# Patient Record
Sex: Female | Born: 1990
Health system: Southern US, Community
[De-identification: ages and names within clinical notes are randomized; demographics above are authoritative.]

## PROBLEM LIST (undated history)

## (undated) ENCOUNTER — Inpatient Hospital Stay (HOSPITAL_COMMUNITY): Payer: Self-pay

## (undated) DIAGNOSIS — F329 Major depressive disorder, single episode, unspecified: Secondary | ICD-10-CM

## (undated) DIAGNOSIS — R51 Headache: Secondary | ICD-10-CM

## (undated) DIAGNOSIS — D649 Anemia, unspecified: Secondary | ICD-10-CM

## (undated) DIAGNOSIS — F53 Postpartum depression: Secondary | ICD-10-CM

## (undated) DIAGNOSIS — F32A Depression, unspecified: Secondary | ICD-10-CM

## (undated) DIAGNOSIS — T7840XA Allergy, unspecified, initial encounter: Secondary | ICD-10-CM

## (undated) DIAGNOSIS — Z9889 Other specified postprocedural states: Secondary | ICD-10-CM

## (undated) DIAGNOSIS — O210 Mild hyperemesis gravidarum: Secondary | ICD-10-CM

## (undated) DIAGNOSIS — R112 Nausea with vomiting, unspecified: Secondary | ICD-10-CM

## (undated) DIAGNOSIS — O99345 Other mental disorders complicating the puerperium: Secondary | ICD-10-CM

## (undated) DIAGNOSIS — O139 Gestational [pregnancy-induced] hypertension without significant proteinuria, unspecified trimester: Secondary | ICD-10-CM

## (undated) HISTORY — DX: Allergy, unspecified, initial encounter: T78.40XA

## (undated) HISTORY — PX: OVARIAN CYST REMOVAL: SHX89

## (undated) HISTORY — DX: Anemia, unspecified: D64.9

## (undated) HISTORY — PX: TONSILLECTOMY: SUR1361

## (undated) HISTORY — DX: Depression, unspecified: F32.A

## (undated) HISTORY — DX: Other mental disorders complicating the puerperium: O99.345

## (undated) HISTORY — DX: Major depressive disorder, single episode, unspecified: F32.9

## (undated) HISTORY — DX: Gestational (pregnancy-induced) hypertension without significant proteinuria, unspecified trimester: O13.9

## (undated) HISTORY — DX: Mild hyperemesis gravidarum: O21.0

---
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
## 2008-03-16 ENCOUNTER — Emergency Department (HOSPITAL_COMMUNITY): Admission: EM | Admit: 2008-03-16 | Discharge: 2008-03-16 | Payer: Self-pay | Admitting: Emergency Medicine

## 2008-06-18 ENCOUNTER — Emergency Department (HOSPITAL_COMMUNITY): Admission: EM | Admit: 2008-06-18 | Discharge: 2008-06-18 | Payer: Self-pay | Admitting: Emergency Medicine

## 2008-06-23 ENCOUNTER — Emergency Department (HOSPITAL_COMMUNITY): Admission: EM | Admit: 2008-06-23 | Discharge: 2008-06-23 | Payer: Self-pay | Admitting: Emergency Medicine

## 2008-12-28 ENCOUNTER — Emergency Department (HOSPITAL_COMMUNITY): Admission: EM | Admit: 2008-12-28 | Discharge: 2008-12-28 | Payer: Self-pay | Admitting: Emergency Medicine

## 2009-02-11 ENCOUNTER — Emergency Department (HOSPITAL_COMMUNITY): Admission: EM | Admit: 2009-02-11 | Discharge: 2009-02-11 | Payer: Self-pay | Admitting: Emergency Medicine

## 2009-04-06 ENCOUNTER — Emergency Department (HOSPITAL_COMMUNITY): Admission: EM | Admit: 2009-04-06 | Discharge: 2009-04-06 | Payer: Self-pay | Admitting: Emergency Medicine

## 2009-05-21 ENCOUNTER — Emergency Department (HOSPITAL_COMMUNITY): Admission: EM | Admit: 2009-05-21 | Discharge: 2009-05-22 | Payer: Self-pay | Admitting: Emergency Medicine

## 2009-09-24 ENCOUNTER — Emergency Department (HOSPITAL_COMMUNITY): Admission: EM | Admit: 2009-09-24 | Discharge: 2009-09-24 | Payer: Self-pay | Admitting: Emergency Medicine

## 2010-06-12 ENCOUNTER — Encounter: Payer: Self-pay | Admitting: Obstetrics & Gynecology

## 2010-06-21 ENCOUNTER — Emergency Department (HOSPITAL_COMMUNITY)
Admission: EM | Admit: 2010-06-21 | Discharge: 2010-06-21 | Payer: Self-pay | Source: Home / Self Care | Admitting: Emergency Medicine

## 2010-06-21 LAB — URINALYSIS, ROUTINE W REFLEX MICROSCOPIC
Leukocytes, UA: NEGATIVE
Nitrite: NEGATIVE
Protein, ur: NEGATIVE mg/dL
Specific Gravity, Urine: 1.034 — ABNORMAL HIGH (ref 1.005–1.030)
Urobilinogen, UA: 0.2 mg/dL (ref 0.0–1.0)

## 2010-06-21 LAB — POCT PREGNANCY, URINE: Preg Test, Ur: NEGATIVE

## 2010-06-21 LAB — POCT I-STAT, CHEM 8
BUN: 13 mg/dL (ref 6–23)
Calcium, Ion: 1.12 mmol/L (ref 1.12–1.32)
Creatinine, Ser: 0.8 mg/dL (ref 0.4–1.2)
Glucose, Bld: 86 mg/dL (ref 70–99)
Hemoglobin: 15.3 g/dL — ABNORMAL HIGH (ref 12.0–15.0)
TCO2: 27 mmol/L (ref 0–100)

## 2010-06-21 LAB — URINE MICROSCOPIC-ADD ON

## 2010-07-02 ENCOUNTER — Emergency Department (HOSPITAL_COMMUNITY)
Admission: EM | Admit: 2010-07-02 | Discharge: 2010-07-02 | Disposition: A | Discharge status: Home / Self Care | Payer: BC Managed Care – PPO | Attending: Emergency Medicine | Admitting: Emergency Medicine

## 2010-07-02 DIAGNOSIS — S60949A Unspecified superficial injury of unspecified finger, initial encounter: Secondary | ICD-10-CM | POA: Insufficient documentation

## 2010-07-02 DIAGNOSIS — W4909XA Other specified item causing external constriction, initial encounter: Secondary | ICD-10-CM | POA: Insufficient documentation

## 2010-07-02 DIAGNOSIS — M7989 Other specified soft tissue disorders: Secondary | ICD-10-CM | POA: Insufficient documentation

## 2010-07-02 DIAGNOSIS — M79609 Pain in unspecified limb: Secondary | ICD-10-CM | POA: Insufficient documentation

## 2010-08-10 LAB — DIFFERENTIAL
Basophils Absolute: 0.1 10*3/uL (ref 0.0–0.1)
Eosinophils Relative: 3 % (ref 0–5)
Lymphocytes Relative: 37 % (ref 12–46)
Monocytes Relative: 8 % (ref 3–12)
Neutro Abs: 3.4 10*3/uL (ref 1.7–7.7)

## 2010-08-10 LAB — COMPREHENSIVE METABOLIC PANEL
Albumin: 4.1 g/dL (ref 3.5–5.2)
BUN: 11 mg/dL (ref 6–23)
Calcium: 8.9 mg/dL (ref 8.4–10.5)
Chloride: 107 mEq/L (ref 96–112)
Creatinine, Ser: 0.66 mg/dL (ref 0.4–1.2)
GFR calc non Af Amer: >60 mL/min (ref >60)
Total Bilirubin: 1.1 mg/dL (ref 0.3–1.2)

## 2010-08-10 LAB — URINALYSIS, ROUTINE W REFLEX MICROSCOPIC
Bilirubin Urine: NEGATIVE
Glucose, UA: NEGATIVE mg/dL
Hgb urine dipstick: NEGATIVE
Ketones, ur: NEGATIVE mg/dL
Protein, ur: NEGATIVE mg/dL
Urobilinogen, UA: 0.2 mg/dL (ref 0.0–1.0)

## 2010-08-10 LAB — CBC
HCT: 40.8 % (ref 36.0–46.0)
MCHC: 34.1 g/dL (ref 30.0–36.0)
MCV: 96.1 fL (ref 78.0–100.0)
Platelets: UNDETERMINED 10*3/uL (ref 150–400)
WBC: 6.9 10*3/uL (ref 4.0–10.5)

## 2010-08-10 LAB — POCT PREGNANCY, URINE: Preg Test, Ur: NEGATIVE

## 2010-08-25 LAB — POCT PREGNANCY, URINE: Preg Test, Ur: NEGATIVE

## 2010-08-27 LAB — URINALYSIS, ROUTINE W REFLEX MICROSCOPIC
Ketones, ur: NEGATIVE mg/dL
Nitrite: NEGATIVE
Protein, ur: NEGATIVE mg/dL
Urobilinogen, UA: 0.2 mg/dL (ref 0.0–1.0)

## 2010-08-28 LAB — URINALYSIS, ROUTINE W REFLEX MICROSCOPIC
Bilirubin Urine: NEGATIVE
Hgb urine dipstick: NEGATIVE
Ketones, ur: 15 mg/dL — AB
Protein, ur: NEGATIVE mg/dL
Urobilinogen, UA: 1 mg/dL (ref 0.0–1.0)

## 2010-08-28 LAB — CBC
HCT: 44.7 % (ref 36.0–46.0)
MCHC: 34.3 g/dL (ref 30.0–36.0)
MCV: 95.5 fL (ref 78.0–100.0)
Platelets: 247 10*3/uL (ref 150–400)
RDW: 13.4 % (ref 11.5–15.5)
WBC: 7 10*3/uL (ref 4.0–10.5)

## 2010-08-28 LAB — DIFFERENTIAL
Eosinophils Absolute: 0 10*3/uL (ref 0.0–0.7)
Eosinophils Relative: 1 % (ref 0–5)
Lymphs Abs: 2.3 10*3/uL (ref 0.7–4.0)

## 2010-08-28 LAB — POCT I-STAT, CHEM 8
Creatinine, Ser: 0.6 mg/dL (ref 0.4–1.2)
Hemoglobin: 17 g/dL — ABNORMAL HIGH (ref 12.0–15.0)
Sodium: 137 mEq/L (ref 135–145)
TCO2: 20 mmol/L (ref 0–100)

## 2010-09-06 LAB — POCT I-STAT, CHEM 8
BUN: 8 mg/dL (ref 6–23)
Chloride: 106 mEq/L (ref 96–112)
Potassium: 4.4 mEq/L (ref 3.5–5.1)
Sodium: 138 mEq/L (ref 135–145)

## 2010-09-06 LAB — URINALYSIS, ROUTINE W REFLEX MICROSCOPIC
Glucose, UA: NEGATIVE mg/dL
Hgb urine dipstick: NEGATIVE
Ketones, ur: 15 mg/dL — AB
Protein, ur: NEGATIVE mg/dL

## 2010-09-06 LAB — URINE MICROSCOPIC-ADD ON

## 2010-09-07 LAB — DIFFERENTIAL
Basophils Absolute: 0.1 10*3/uL (ref 0.0–0.1)
Lymphocytes Relative: 9 % — ABNORMAL LOW (ref 12–46)
Neutro Abs: 13.1 10*3/uL — ABNORMAL HIGH (ref 1.7–7.7)

## 2010-09-07 LAB — POCT I-STAT, CHEM 8
BUN: 5 mg/dL — ABNORMAL LOW (ref 6–23)
Calcium, Ion: 1.13 mmol/L (ref 1.12–1.32)
Chloride: 106 mEq/L (ref 96–112)
Creatinine, Ser: 0.8 mg/dL (ref 0.4–1.2)
Glucose, Bld: 85 mg/dL (ref 70–99)
HCT: 42 % (ref 36.0–46.0)
Hemoglobin: 14.3 g/dL (ref 12.0–15.0)
Potassium: 3.7 mEq/L (ref 3.5–5.1)
Sodium: 138 mEq/L (ref 135–145)
TCO2: 24 mmol/L (ref 0–100)

## 2010-09-07 LAB — URINE MICROSCOPIC-ADD ON

## 2010-09-07 LAB — URINALYSIS, ROUTINE W REFLEX MICROSCOPIC
Glucose, UA: NEGATIVE mg/dL
pH: 6 (ref 5.0–8.0)

## 2010-09-07 LAB — URINE CULTURE

## 2010-09-07 LAB — CBC
HCT: 40 % (ref 36.0–46.0)
Hemoglobin: 13.6 g/dL (ref 12.0–15.0)
Platelets: 222 10*3/uL (ref 150–400)
WBC: 15.5 10*3/uL — ABNORMAL HIGH (ref 4.0–10.5)

## 2010-10-08 NOTE — H&P (Signed)
   Carla [name], SPIEWAK                         ACCOUNT NO.:  [id number]   MEDICAL RECORD NO.:  [id number]                   PATIENT TYPE:  INP   LOCATION:  9325                                 FACILITY:  [facility]   PHYSICIAN:  Roseanna Rainbow, M.D.         DATE OF BIRTH:  [birth year]   DATE OF ADMISSION:  08/22/2002  DATE OF DISCHARGE:                                HISTORY & PHYSICAL                                               Roseanna Rainbow, M.D.    LAJ/MEDQ  D:  08/22/2002  T:  08/22/2002  Job:  [id number]

## 2010-10-08 NOTE — Discharge Summary (Signed)
Carla [name], Carla [name]                         ACCOUNT NO.:  [id number]   MEDICAL RECORD NO.:  [id number]                   PATIENT TYPE:  INP   LOCATION:  9325                                 FACILITY:  [facility]   PHYSICIAN:  Charles A. Clearance Coots, M.D.             DATE OF BIRTH:  16-Jul-1990   DATE OF ADMISSION:  08/22/2002  DATE OF DISCHARGE:  08/25/2002                                 DISCHARGE SUMMARY   ADMITTING DIAGNOSES:  Right ovarian cyst with secondary pain, rule out  torsion.   DISCHARGE DIAGNOSES:  Right ovarian cyst with secondary pain, rule out  torsion.  Status post laparoscopic right ovarian cystectomy.  The patient  discharged home in good condition.   REASON FOR ADMISSION:  A 20 year old adolescent female with right ovarian  cyst and secondary pain admitted for preoperative observation and  laparoscopic ovarian cystectomy on August 23, 2002.   HISTORY OF PRESENT ILLNESS:  The patient began experiencing abdominal pain  approximately two weeks ago.  The location of the pain in the right lower  quadrant.  Quality of the pain was poorly described.  The severity of the  pain was poorly described.  The symptoms had been worsening recently and had  increased in intensity on the day of admission upon presentation.  There are  no clearly defined precipitating factors for the pain.  Associated symptoms  on the day of admission included decrease in appetite.  The patient had  tried Tylenol and Motrin without success.  Ultrasound performed by Betti D.  Pecola Leisure, M.D. demonstrated a complex cyst with septation in the right adnexa.  The cyst and right ovary measured a maximum diameter of 7.3 cm.   PAST SURGICAL HISTORY:  Tonsillectomy and adenoidectomy.   PAST MEDICAL HISTORY:  None.   MEDICATIONS:  Tylenol and Motrin.   ALLERGIES:  No known drug allergies.   SOCIAL HISTORY:  Adolescent Consulting civil engineer.  Denies tobacco, alcohol, or  recreational drug use.   REVIEW OF SYSTEMS:  Per history  of present illness.   PHYSICAL EXAMINATION:  GENERAL:  Well-nourished, well-developed female in no  acute distress.  VITAL SIGNS:  Temperature 97.9, pulse 80, blood pressure 100/60, respiratory  rate 20.  LUNGS:  Clear to auscultation bilaterally.  HEART:  Regular rate and rhythm.  ABDOMEN:  Soft, nontender without masses.  Bowel sounds were active.  PELVIC:  Normal external female genitalia.  Pubic hair distribution is a  Tanner stage V.  The patient was unable to tolerate a digital pelvic or  rectal examination.   ADMITTING LABORATORY VALUES:  See chart.   HOSPITAL COURSE:  The patient underwent laparoscopic right ovarian  cystectomy on August 23, 2002.  There were no intraoperative complications.  Postoperative course was relatively uncomplicated except for severe nausea  and vomiting and inability to tolerate any diet for the first 24 hours.  She  was much improved after 24 hours with supportive  measures, antiemetics and  she was discharged home on postoperative day two tolerating a regular diet  and doing well.   DISCHARGE DISPOSITION:  Medications:  Percocet one to two tablets q.3-4h. as  needed for pain, ibuprofen 600 mg p.o. q.6h. as needed for pain.  Routine  written surgical postoperative GYN instructions were given per booklet.  The  patient is to follow up at the [facility] for a postoperative  check in two weeks.                                               Charles A. Clearance Coots, M.D.    CAH/MEDQ  D:  08/25/2002  T:  08/26/2002  Job:  [id number]

## 2010-10-08 NOTE — Op Note (Signed)
NAMETARRAH, Carla [name]                         ACCOUNT NO.:  [id number]   MEDICAL RECORD NO.:  [id number]                   PATIENT TYPE:  INP   LOCATION:  9325                                 [facility]ILITY:  [facility]   PHYSICIAN:  Roseanna Rainbow, M.D.         DATE OF BIRTH:  30-Jul-1990   DATE OF PROCEDURE:  08/23/2002  DATE OF DISCHARGE:  08/25/2002                                 OPERATIVE REPORT   PREOPERATIVE DIAGNOSIS:  Right ovarian cyst.   POSTOPERATIVE DIAGNOSIS:  Right ovarian cyst.   PROCEDURE:  Laparoscopic ovarian cystectomy.   SURGEON:  Roseanna Rainbow, M.D.   ANESTHESIA:  General endotracheal.   COMPLICATIONS:  None.   ESTIMATED BLOOD LOSS:  Less than 100 mL.   FLUIDS:  As per anesthesia.   FINDINGS:  Normal uterus, tubes, and left ovary.  The right ovary had a  simple 4-5 cm cyst.   TECHNIQUE:  The patient is taken to the operating room where general  anesthesia was obtained without difficulty.  The patient was placed in the  dorsal lithotomy position and prepped and draped in the usual sterile  fashion.  Attention was then turned to the patient's abdomen where a 10 mm  skin incision was made in the umbilical fold.  The Veress needle was  introduced at a 45-degree angle into the peritoneal cavity while tenting up  the abdomen.  A pneumoperitoneum was obtained with CO2 gas.  The trocar and  sleeve were then advanced without difficulty into the abdomen for intra-  abdominal placement __________ by the laparoscope.  A second, third, and  fourth trocar sleeve were then advanced under direct visualization.  A  survey of the patient's pelvis and abdomen revealed normal anatomy, as  above.  At this point, the utero-ovarian ligament was grasped with a  grasper.  The most dependent portion of the ovarian cortex was then incised  with Endoshears using cautery.  Attempts were made to dissect out the  ovarian cyst using hydrodissection with the Nezhat irrigator.   However, the  cyst was inadvertently ruptured.  The cyst wall was then dissected from the  ovarian cortex using both sharp and blunt dissection.  Bleeding points in  the defect in the ovary were cauterized.  Adequate hemostasis was noted.  The instruments were then removed from the patient's abdomen.  The  suprapubic fascial incision was reapproximated with 0 Vicryl.  The remainder  of the incisions were repaired with 3-0 Vicryl and Dermabond.  The patient  tolerated the procedure well.  Sponge, lap, and needle counts were correct  x2.  She was taken to the PACU in stable condition.                                               Roseanna Rainbow,  M.D.    LAJ/MEDQ  D:  08/26/2002  T:  08/26/2002  Job:  [id number]

## 2010-10-08 NOTE — H&P (Signed)
Carla [name], BEEDLE                         ACCOUNT NO.:  [id number]   MEDICAL RECORD NO.:  [id number]                   PATIENT TYPE:  INP   LOCATION:  9325                                 [facility]   PHYSICIAN:  Roseanna Rainbow, M.D.         DATE OF BIRTH:  03/16/1991   DATE OF ADMISSION:  08/22/2002  DATE OF DISCHARGE:                                HISTORY & PHYSICAL   CHIEF COMPLAINT:  The patient is a 20 year old female with right ovarian  cyst with secondary pain who is admitted for preoperative observation. She  is for a laparoscopic ovarian cystectomy on August 23, 2002.   HISTORY OF PRESENT ILLNESS:  The patient began experiencing abdominal pain  approximately two weeks ago. The location of the pain is in the right lower  quadrant. Quality of the pain is poorly described. The severity of the pain  is poorly described. The symptoms have been worsening recently and had  increase in intensity today upon presentation. There are no clearly defined  precipitating factors for the pain. Associated symptoms today include  decrease in appetite. Therapy tried include Tylenol and Motrin. An  ultrasound performed at the request of Dr. Leilani Able demonstrated a  complex cyst with a septation in the right adnexa. The cyst and right ovary  measured a maximum diameter of 7.3 cm.   OB/GYN HISTORY:  Menstrual periods described as regular. There is a 28 day  interval between cycles. The duration is 7 days. Her last menstrual period  was delayed several weeks. Date of last menstrual period was August 19, 2002.  She is not sexually active.   PAST MEDICAL HISTORY:  No significant history of medical diseases.   PAST SURGICAL HISTORY:  Tonsillectomy and adenoidectomy.   SOCIAL HISTORY:  Tobacco, she has no significant smoking history. Alcohol,  does not give any significant history of alcohol usage. A minimal amount of  caffeinated beverages daily. Denies illicit drug use.   FAMILY HISTORY:  Noncontributory.   REVIEW OF SYSTEMS:  GI as above.   MEDICATIONS:  Tylenol and Motrin.   ALLERGIES:  SHRIMP.   PHYSICAL EXAMINATION:  VITAL SIGNS: Height 5'3. Weight 143 pounds.  Temperature 97.9. Pulse 80. Blood pressure 100/60, respiratory rate 20.  GENERAL:  A well developed, well nourished  female in minimal distress.  ABDOMEN: Soft and nontender without masses. Bowel sounds active.  GU: External female genitalis normal. Pubic hair distribution is Tanner  stage V. Unable to tolerate a digital, pelvic, or rectal examination.   ASSESSMENT:  Right ovarian cyst with secondary pain, rule out  intermittent  torigian.   PLAN:  Admit for observation preoperatively. The planned procedure for August 23, 2002 for laparoscopic ovarian cystectomy. The risks, benefits, and  alternative forms of management were reviewed with both the patient and the  patient's mother. Informed consent was obtained.  Roseanna Rainbow, M.D.    Judee Clara  D:  08/22/2002  T:  08/22/2002  Job:  [id number]   cc:   Jocelyn Lamer D. Pecola Leisure, M.D.  [phone] [address]  [place]  [place] [address]  Fax: [phone]

## 2010-11-13 ENCOUNTER — Inpatient Hospital Stay (HOSPITAL_COMMUNITY)
Admission: AD | Admit: 2010-11-13 | Discharge: 2010-11-13 | Disposition: A | Discharge status: Home / Self Care | Payer: BC Managed Care – PPO | Source: Ambulatory Visit | Attending: Obstetrics & Gynecology | Admitting: Obstetrics & Gynecology

## 2010-11-13 DIAGNOSIS — O210 Mild hyperemesis gravidarum: Secondary | ICD-10-CM | POA: Insufficient documentation

## 2010-11-13 LAB — URINE MICROSCOPIC-ADD ON

## 2010-11-13 LAB — URINALYSIS, ROUTINE W REFLEX MICROSCOPIC
Bilirubin Urine: NEGATIVE
Glucose, UA: NEGATIVE mg/dL
Ketones, ur: 15 mg/dL — AB
Protein, ur: NEGATIVE mg/dL
Urobilinogen, UA: 0.2 mg/dL (ref 0.0–1.0)

## 2010-12-03 ENCOUNTER — Encounter (HOSPITAL_COMMUNITY): Payer: Self-pay | Admitting: Anesthesiology

## 2010-12-03 ENCOUNTER — Encounter (HOSPITAL_COMMUNITY)
Admission: RE | Disposition: A | Discharge status: Home / Self Care | Payer: Self-pay | Source: Ambulatory Visit | Attending: Obstetrics & Gynecology

## 2010-12-03 ENCOUNTER — Ambulatory Visit (HOSPITAL_COMMUNITY)
Admission: RE | Admit: 2010-12-03 | Discharge: 2010-12-03 | Disposition: A | Discharge status: Home / Self Care | Payer: BC Managed Care – PPO | Source: Ambulatory Visit | Attending: Obstetrics & Gynecology | Admitting: Obstetrics & Gynecology

## 2010-12-03 ENCOUNTER — Ambulatory Visit (HOSPITAL_COMMUNITY): Payer: BC Managed Care – PPO | Admitting: Anesthesiology

## 2010-12-03 ENCOUNTER — Other Ambulatory Visit: Payer: Self-pay | Admitting: Obstetrics & Gynecology

## 2010-12-03 ENCOUNTER — Encounter (HOSPITAL_COMMUNITY): Payer: Self-pay | Admitting: *Deleted

## 2010-12-03 DIAGNOSIS — O034 Incomplete spontaneous abortion without complication: Secondary | ICD-10-CM | POA: Insufficient documentation

## 2010-12-03 DIAGNOSIS — O039 Complete or unspecified spontaneous abortion without complication: Secondary | ICD-10-CM | POA: Diagnosis present

## 2010-12-03 HISTORY — PX: DILATION AND EVACUATION: SHX1459

## 2010-12-03 LAB — CBC
HCT: 38.4 % (ref 36.0–46.0)
Platelets: 274 10*3/uL (ref 150–400)
RBC: 4.25 MIL/uL (ref 3.87–5.11)
RDW: 13.1 % (ref 11.5–15.5)
WBC: 9.1 10*3/uL (ref 4.0–10.5)

## 2010-12-03 SURGERY — DILATION AND EVACUATION, UTERUS
Anesthesia: Monitor Anesthesia Care

## 2010-12-03 MED ORDER — ONDANSETRON HCL 4 MG/2ML IJ SOLN
INTRAMUSCULAR | Status: AC
  Filled 2010-12-03: qty 2

## 2010-12-03 MED ORDER — DEXAMETHASONE SODIUM PHOSPHATE 10 MG/ML IJ SOLN
INTRAMUSCULAR | Status: AC
  Filled 2010-12-03: qty 1

## 2010-12-03 MED ORDER — FENTANYL CITRATE 0.05 MG/ML IJ SOLN
INTRAMUSCULAR | Status: AC
  Filled 2010-12-03: qty 2

## 2010-12-03 MED ORDER — MEPERIDINE HCL 25 MG/ML IJ SOLN: 6.2500 mg | INTRAMUSCULAR | Status: DC | PRN

## 2010-12-03 MED ORDER — PROPOFOL 10 MG/ML IV EMUL
INTRAVENOUS | Status: DC | PRN
  Administered 2010-12-03: 30 mg via INTRAVENOUS
  Administered 2010-12-03: 40 mg via INTRAVENOUS
  Administered 2010-12-03 (×2): 30 mg via INTRAVENOUS
  Administered 2010-12-03: 40 mg via INTRAVENOUS
  Administered 2010-12-03: 30 mg via INTRAVENOUS

## 2010-12-03 MED ORDER — ONDANSETRON HCL 4 MG/2ML IJ SOLN
INTRAMUSCULAR | Status: DC | PRN
  Administered 2010-12-03: 4 mg via INTRAVENOUS

## 2010-12-03 MED ORDER — PROPOFOL 10 MG/ML IV EMUL
INTRAVENOUS | Status: AC
  Filled 2010-12-03: qty 20

## 2010-12-03 MED ORDER — MIDAZOLAM HCL 2 MG/2ML IJ SOLN
INTRAMUSCULAR | Status: AC
  Filled 2010-12-03: qty 2

## 2010-12-03 MED ORDER — KETOROLAC TROMETHAMINE 30 MG/ML IJ SOLN
INTRAMUSCULAR | Status: DC | PRN
  Administered 2010-12-03: 30 mg via INTRAVENOUS

## 2010-12-03 MED ORDER — LIDOCAINE HCL (CARDIAC) 20 MG/ML IV SOLN
INTRAVENOUS | Status: AC
  Filled 2010-12-03: qty 5

## 2010-12-03 MED ORDER — MIDAZOLAM HCL 5 MG/5ML IJ SOLN
INTRAMUSCULAR | Status: DC | PRN
  Administered 2010-12-03: 2 mg via INTRAVENOUS

## 2010-12-03 MED ORDER — ONDANSETRON HCL 4 MG/2ML IJ SOLN: 4.0000 mg | Freq: Once | INTRAMUSCULAR | Status: DC | PRN

## 2010-12-03 MED ORDER — OXYCODONE-ACETAMINOPHEN 5-325 MG PO TABS: 2.0000 | ORAL_TABLET | Freq: Four times a day (QID) | ORAL | Status: DC | PRN

## 2010-12-03 MED ORDER — LIDOCAINE HCL 1 % IJ SOLN
INTRAMUSCULAR | Status: DC | PRN
  Administered 2010-12-03: 10 mL via INTRADERMAL

## 2010-12-03 MED ORDER — DOXYCYCLINE HYCLATE 100 MG IV SOLR
200.0000 mg | INTRAVENOUS | Status: AC
  Administered 2010-12-03: 200 mg via INTRAVENOUS
  Filled 2010-12-03: qty 200

## 2010-12-03 MED ORDER — LACTATED RINGERS IV SOLN
INTRAVENOUS | Status: DC | PRN
  Administered 2010-12-03: 13:00:00 via INTRAVENOUS

## 2010-12-03 MED ORDER — FENTANYL CITRATE 0.05 MG/ML IJ SOLN
INTRAMUSCULAR | Status: DC | PRN
  Administered 2010-12-03: 100 ug via INTRAVENOUS

## 2010-12-03 MED ORDER — NORETHIN ACE-ETH ESTRAD-FE 1-20 MG-MCG(24) PO TABS: 1.0000 | ORAL_TABLET | ORAL | Status: DC

## 2010-12-03 MED ORDER — FENTANYL CITRATE 0.05 MG/ML IJ SOLN
25.0000 ug | INTRAMUSCULAR | Status: DC | PRN
Start: 2010-12-03 — End: 2010-12-03

## 2010-12-03 MED ORDER — KETOROLAC TROMETHAMINE 30 MG/ML IJ SOLN
INTRAMUSCULAR | Status: AC
  Filled 2010-12-03: qty 1

## 2010-12-03 MED ORDER — ACETAMINOPHEN 325 MG PO TABS: 325.0000 mg | ORAL_TABLET | ORAL | Status: DC | PRN

## 2010-12-03 MED ORDER — LACTATED RINGERS IV SOLN: INTRAVENOUS | Status: DC

## 2010-12-03 MED ORDER — DEXAMETHASONE SODIUM PHOSPHATE 10 MG/ML IJ SOLN
INTRAMUSCULAR | Status: DC | PRN
  Administered 2010-12-03: 10 mg via INTRAVENOUS
  Administered 2010-12-03: 30 mg via INTRAVENOUS

## 2010-12-03 SURGICAL SUPPLY — 18 items
CATH ROBINSON RED A/P 16FR (CATHETERS) ×2 IMPLANT
CLOTH BEACON ORANGE TIMEOUT ST (SAFETY) ×2 IMPLANT
DECANTER SPIKE VIAL GLASS SM (MISCELLANEOUS) ×2 IMPLANT
DRAPE UTILITY XL STRL (DRAPES) ×2 IMPLANT
GLOVE BIO SURGEON STRL SZ 6.5 (GLOVE) ×4 IMPLANT
GOWN BRE IMP SLV AUR LG STRL (GOWN DISPOSABLE) ×4 IMPLANT
KIT BERKELEY 1ST TRIMESTER 3/8 (MISCELLANEOUS) ×2 IMPLANT
NEEDLE SPNL 22GX3.5 QUINCKE BK (NEEDLE) ×2 IMPLANT
NS IRRIG 1000ML POUR BTL (IV SOLUTION) ×2 IMPLANT
PACK VAGINAL MINOR WOMEN LF (CUSTOM PROCEDURE TRAY) ×2 IMPLANT
PAD PREP 24X48 CUFFED NSTRL (MISCELLANEOUS) ×2 IMPLANT
SET BERKELEY SUCTION TUBING (SUCTIONS) ×2 IMPLANT
SYR CONTROL 10ML LL (SYRINGE) ×2 IMPLANT
TOWEL OR 17X24 6PK STRL BLUE (TOWEL DISPOSABLE) ×4 IMPLANT
VACURETTE 10 RIGID CVD (CANNULA) IMPLANT
VACURETTE 7MM CVD STRL WRAP (CANNULA) ×2 IMPLANT
VACURETTE 8 RIGID CVD (CANNULA) IMPLANT
VACURETTE 9 RIGID CVD (CANNULA) IMPLANT

## 2010-12-03 NOTE — H&P (Signed)
Subjective: Patient is a 20 y.o. gravida 1 para 1, female.  I was consulted for management of an SAB.  Pertinent Gyn History: Non-contributory.  Patient Active Problem List  Diagnoses Date Noted  . Spontaneous abortion 12/03/2010   Past Medical History  Diagnosis Date  . Spontaneous abortion 12/03/2010    History reviewed. No pertinent past surgical history.  Prescriptions prior to admission  Medication Sig Dispense Refill  . acetaminophen (TYLENOL) 500 MG tablet Take 500 mg by mouth daily as needed.        . prenatal vitamin w/FE, FA (PRENATAL 1 + 1) 27-1 MG TABS Take 1 tablet by mouth daily.         Allergies  Allergen Reactions  . Shellfish Allergy Anaphylaxis  . Latex Itching    History  Substance Use Topics  . Smoking status: Never Smoker   . Smokeless tobacco: Not on file  . Alcohol Use: Yes     occ    History reviewed. No pertinent family history.  Review of Systems Noncontibutory   Objective: Vital signs in last 24 hours: Temp:  [98.4 F (36.9 C)] 98.4 F (36.9 C) (07/13 1227) Pulse Rate:  [60] 60  (07/13 1227) Resp:  [18] 18  (07/13 1204) BP: (124)/(72) 124/72 mmHg (07/13 1204) SpO2:  [100 %] 100 % (07/13 1227) Weight:  [78.926 kg (174 lb)] 174 lb (78.926 kg) (07/13 1227)  BP 124/72  Pulse 60  Temp(Src) 98.4 F (36.9 C) (Oral)  Resp 18  Ht 5\' 3"  (1.6 m)  Wt 78.926 kg (174 lb)  BMI 30.82 kg/m2  SpO2 100%  General Appearance:    Alert, cooperative, no distress, appears stated age  Head:    Normocephalic, without obvious abnormality, atraumatic  Eyes:    PERRL, conjunctiva/corneas clear, EOM's intact, fundi    benign, both eyes  Ears:    Normal TM's and external ear canals, both ears  Nose:   Nares normal, septum midline, mucosa normal, no drainage    or sinus tenderness  Throat:   Lips, mucosa, and tongue normal; teeth and gums normal  Neck:   Supple, symmetrical, trachea midline, no adenopathy;    thyroid:  no enlargement/tenderness/nodules; no  carotid   bruit or JVD  Back:     Symmetric, no curvature, ROM normal, no CVA tenderness  Lungs:     Clear to auscultation bilaterally, respirations unlabored  Chest Wall:    No tenderness or deformity   Heart:    Regular rate and rhythm, S1 and S2 normal, no murmur, rub   or gallop  Breast Exam:    No tenderness, masses, or nipple abnormality  Abdomen:     Soft, non-tender, bowel sounds active all four quadrants,    no masses, no organomegaly  Genitalia:    Normal female without lesion, discharge or tenderness  Rectal:    Normal tone, normal prostate, no masses or tenderness;   guaiac negative stool  Extremities:   Extremities normal, atraumatic, no cyanosis or edema  Pulses:   2+ and symmetric all extremities  Skin:   Skin color, texture, turgor normal, no rashes or lesions  Lymph nodes:   Cervical, supraclavicular, and axillary nodes normal  Neurologic:   CNII-XII intact, normal strength, sensation and reflexes    throughout    Data Review: Labs reviewed in EPIC  Assessment/Plan: SAB  A suction- D&C is planned.  Risks/benefits reviewed.  Questions answered to stated satisfaction.

## 2010-12-03 NOTE — Anesthesia Postprocedure Evaluation (Signed)
  Anesthesia Post-op Note  Patient: Carla [name]  Procedure(s) Performed:  DILATATION AND EVACUATION (D&E)  Patient is awake and responsive. Pain and nausea are reasonably well controlled. Vital signs are stable and clinically acceptable. Oxygen saturation is clinically acceptable. There are no apparent anesthetic complications at this time. Patient is ready for discharge.

## 2010-12-03 NOTE — Transfer of Care (Signed)
Immediate Anesthesia Transfer of Care Note  Patient: Carla [name]  Procedure(s) Performed:  DILATATION AND EVACUATION (D&E)  Patient Location: PACU  Anesthesia Type: MAC  Level of Consciousness: awake, alert  and oriented  Airway & Oxygen Therapy: Patient Spontanous Breathing and Patient connected to nasal cannula oxygen  Post-op Assessment: Report given to PACU RN, Post -op Vital signs reviewed and stable and Patient moving all extremities X 4  Post vital signs: Reviewed and stable  Complications: No apparent anesthesia complications

## 2010-12-03 NOTE — Op Note (Signed)
Preoperative diagnosis:  Anembryonic pregnancy  Postoperative diagnosis: Same  Procedure: Suction dilatation and curretage Surgeon: Antionette Char A  Anesthesia: MAC, paracervical block  Estimated blood loss: 100  Urine output: per Anesthesiology   IV Fluids:  per Anesthesiology  Complications: None  Specimen: PATHOLOGY  Operative Findings: The uterus was anteverted, slightly enlarged.  Moderate tissue was retrieved.  Description of procedure:   The patient was taken to the operating room and placed on the operating table in the semi-lithotomy position in [place] stirrups.  Examination under anesthesia was performed.  The patient was prepped and draped in the usual manner.  After a time-out had been completed, a speculum was placed in the vagina.  The anterior lip of the cervix was grasped with a single-toothed tenaculum.  A paracervical block was performed using 10 ml of 1% lidocaine.  The block was performed at 4 and 8 o'clock at the cervical vaginal junction. The cervix was dilated with Shawnie Pons dilators.  A 7 -mm suction curet was inserted in the uterine cavity.  The device was activated and the curet rotated to evacuate the products of conception.   All the instruments were removed from the vagina.  Final instrument counts were correct.  The patient was taken to the PACU in stable condition.

## 2010-12-03 NOTE — Anesthesia Preprocedure Evaluation (Addendum)
Anesthesia Evaluation  Name, MR# and DOB Patient awake  General Assessment Comment  Reviewed: Allergy & Precautions, H&P , Patient's Chart, lab work & pertinent test results and reviewed documented beta blocker date and time   Airway Mallampati: II TM Distance: >3 FB Neck ROM: full    Dental No notable dental hx    Pulmonaryneg pulmonary ROS    clear to auscultation  pulmonary exam normal   Cardiovascular regular Normal   Neuro/PsychNegative Neurological ROS Negative Psych ROS  GI/Hepatic/Renal negative GI ROS, negative Liver ROS, and negative Renal ROS (+)       Endo/Other  Negative Endocrine ROS (+)   Abdominal   Musculoskeletal  Hematology negative hematology ROS (+)   Peds  Reproductive/Obstetrics   Anesthesia Other Findings             Anesthesia Physical Anesthesia Plan  ASA: I  Anesthesia Plan: MAC   Post-op Pain Management:    Induction:   Airway Management Planned:   Additional Equipment:   Intra-op Plan:   Post-operative Plan:   Informed Consent: I have reviewed the patients History and Physical, chart, labs and discussed the procedure including the risks, benefits and alternatives for the proposed anesthesia with the patient or authorized representative who has indicated his/her understanding and acceptance.   Dental Advisory Given  Plan Discussed with: CRNA and Surgeon  Anesthesia Plan Comments: ( Discussed  MAC anesthesia, including possible nausea, instrumentation of airway, sore throat,pulmonary aspiration, etc. I asked if the were any outstanding questions, or  concerns before we proceeded. )        Anesthesia Quick Evaluation

## 2010-12-07 ENCOUNTER — Inpatient Hospital Stay (HOSPITAL_COMMUNITY)
Admission: AD | Admit: 2010-12-07 | Discharge: 2010-12-08 | Disposition: A | Discharge status: Home / Self Care | Payer: BC Managed Care – PPO | Source: Ambulatory Visit | Attending: Obstetrics & Gynecology | Admitting: Obstetrics & Gynecology

## 2010-12-07 DIAGNOSIS — O039 Complete or unspecified spontaneous abortion without complication: Secondary | ICD-10-CM

## 2010-12-08 ENCOUNTER — Encounter (HOSPITAL_COMMUNITY): Payer: Self-pay | Admitting: *Deleted

## 2010-12-08 LAB — CBC
Hemoglobin: 12.3 g/dL (ref 12.0–15.0)
MCH: 30.3 pg (ref 26.0–34.0)
MCHC: 33 g/dL (ref 30.0–36.0)
RDW: 13.4 % (ref 11.5–15.5)

## 2010-12-08 NOTE — Progress Notes (Signed)
Pt G1, had D and C on 7/13 for MAB.  Pt having heavy bleeding and passing "golf ball size clots."  Pt having "unbearable" lower abd pain.

## 2010-12-08 NOTE — ED Provider Notes (Signed)
Carla [name] is a  20 y.o. G1P0010 now 4 d s/p D&E for anembyonic pregnancy. She started work yesterday and was having increased bleeding and pain relieved by rest. Tonight at work at the [facility] she was doing a lot of strenuous activity and had sharp lower abd pain and passed 6 golf-ball sized clots. Now at rest she is not actively bleeding and abd pain is better. No tissue passed. No orthostatic sx. Hgb 12/03/10 was 13.2. She has Percocet but cannot take it at work. Ibuprofen 800 was not helping.   Filed Vitals:   12/08/10 0018  BP: 115/68  Pulse: 60  Temp: 98.3 F (36.8 C)  Resp: 16     PE: Gen: NAD        Abd: soft, minimally tender to suprapubic palpation        Spec: Minimal blood swabbed from cx and vault. No active bleeding now        Bimanual: Cx closed thick,no CMT. Uterus sl enlarged, NT.    Results for orders placed during the hospital encounter of 12/07/10 (from the past 24 hour(s))  CBC     Status: Normal   Collection Time   12/08/10  1:50 AM      Component Value Range   WBC 9.8  4.0 - 10.5 (K/uL)   RBC 4.06  3.87 - 5.11 (MIL/uL)   Hemoglobin 12.3  12.0 - 15.0 (g/dL)   HCT 98.1  19.1 - 47.8 (%)   MCV 91.9  78.0 - 100.0 (fL)   MCH 30.3  26.0 - 34.0 (pg)   MCHC 33.0  30.0 - 36.0 (g/dL)   RDW 29.5  62.1 - 30.8 (%)   Platelets 259  150 - 400 (K/uL)   A: Normal post-op exam  P: Reassured. Will give her a work excuse for another 2 days so she can rest and take Percocet if needed.   Office F/U as scheduled

## 2010-12-08 NOTE — Progress Notes (Signed)
Pt had a D&C done on FRI -07/13 by Dr Jorene Minors has had excessive bleedingwith clots-no bleeding present at this time

## 2010-12-21 ENCOUNTER — Encounter (HOSPITAL_COMMUNITY): Payer: Self-pay | Admitting: Obstetrics & Gynecology

## 2010-12-29 ENCOUNTER — Emergency Department (HOSPITAL_COMMUNITY)
Admission: EM | Admit: 2010-12-29 | Discharge: 2010-12-29 | Disposition: A | Discharge status: Home / Self Care | Payer: Worker's Compensation | Attending: Emergency Medicine | Admitting: Emergency Medicine

## 2010-12-29 DIAGNOSIS — IMO0002 Reserved for concepts with insufficient information to code with codable children: Secondary | ICD-10-CM | POA: Insufficient documentation

## 2010-12-29 DIAGNOSIS — X58XXXA Exposure to other specified factors, initial encounter: Secondary | ICD-10-CM | POA: Insufficient documentation

## 2011-02-10 LAB — URINALYSIS, ROUTINE W REFLEX MICROSCOPIC
Bilirubin Urine: NEGATIVE
Glucose, UA: NEGATIVE
Leukocytes, UA: NEGATIVE
Nitrite: NEGATIVE
Protein, ur: NEGATIVE
Specific Gravity, Urine: 1.008
Urobilinogen, UA: 0.2
pH: 6

## 2011-02-10 LAB — URINE MICROSCOPIC-ADD ON

## 2011-02-10 LAB — PREGNANCY, URINE: Preg Test, Ur: NEGATIVE

## 2011-03-09 LAB — I-STAT 8, (EC8 V) (CONVERTED LAB)
Acid-base deficit: 2
Acid-base deficit: 3 — ABNORMAL HIGH
Bicarbonate: 26 — ABNORMAL HIGH
Chloride: 107
Hemoglobin: 13.9
Potassium: 3.2 — ABNORMAL LOW
Potassium: 4.2
Sodium: 139
Sodium: 141
TCO2: 26
TCO2: 28
pH, Ven: 7.268

## 2011-03-09 LAB — URINALYSIS, ROUTINE W REFLEX MICROSCOPIC
Bilirubin Urine: NEGATIVE
Protein, ur: NEGATIVE
Urobilinogen, UA: 0.2

## 2011-03-09 LAB — CBC
HCT: 41.7
MCHC: 33.8
MCV: 95.4
Platelets: 243
RDW: 13.3

## 2011-03-09 LAB — URINE MICROSCOPIC-ADD ON

## 2011-03-09 LAB — DIFFERENTIAL
Basophils Absolute: 0.1
Basophils Relative: 1
Eosinophils Absolute: 0.1
Eosinophils Relative: 1
Neutrophils Relative %: 70

## 2011-03-09 LAB — POCT I-STAT CREATININE
Creatinine, Ser: 0.8
Operator id: 282201
Operator id: 288831

## 2011-07-18 ENCOUNTER — Encounter (HOSPITAL_COMMUNITY): Payer: Self-pay | Admitting: *Deleted

## 2011-07-18 ENCOUNTER — Other Ambulatory Visit: Payer: Self-pay

## 2011-07-18 ENCOUNTER — Emergency Department (HOSPITAL_COMMUNITY)
Admission: EM | Admit: 2011-07-18 | Discharge: 2011-07-18 | Disposition: A | Discharge status: Home / Self Care | Payer: BC Managed Care – PPO | Attending: Emergency Medicine | Admitting: Emergency Medicine

## 2011-07-18 DIAGNOSIS — R1032 Left lower quadrant pain: Secondary | ICD-10-CM | POA: Insufficient documentation

## 2011-07-18 DIAGNOSIS — R42 Dizziness and giddiness: Secondary | ICD-10-CM | POA: Insufficient documentation

## 2011-07-18 DIAGNOSIS — K5900 Constipation, unspecified: Secondary | ICD-10-CM | POA: Insufficient documentation

## 2011-07-18 LAB — CBC
HCT: 37.7 % (ref 36.0–46.0)
Hemoglobin: 12.8 g/dL (ref 12.0–15.0)
MCH: 30.5 pg (ref 26.0–34.0)
MCV: 90 fL (ref 78.0–100.0)
Platelets: 283 10*3/uL (ref 150–400)
RBC: 4.19 MIL/uL (ref 3.87–5.11)

## 2011-07-18 LAB — DIFFERENTIAL
Eosinophils Absolute: 0.2 10*3/uL (ref 0.0–0.7)
Eosinophils Relative: 2 % (ref 0–5)
Lymphs Abs: 3.8 10*3/uL (ref 0.7–4.0)
Monocytes Absolute: 0.8 10*3/uL (ref 0.1–1.0)
Monocytes Relative: 8 % (ref 3–12)

## 2011-07-18 LAB — COMPREHENSIVE METABOLIC PANEL
BUN: 12 mg/dL (ref 6–23)
CO2: 25 mEq/L (ref 19–32)
Calcium: 9.7 mg/dL (ref 8.4–10.5)
GFR calc Af Amer: >90 mL/min (ref >90)
GFR calc non Af Amer: >90 mL/min (ref >90)
Glucose, Bld: 85 mg/dL (ref 70–99)
Total Protein: 7.4 g/dL (ref 6.0–8.3)

## 2011-07-18 LAB — URINALYSIS, ROUTINE W REFLEX MICROSCOPIC
Bilirubin Urine: NEGATIVE
Ketones, ur: NEGATIVE mg/dL
Nitrite: NEGATIVE
pH: 6.5 (ref 5.0–8.0)

## 2011-07-18 LAB — URINE MICROSCOPIC-ADD ON

## 2011-07-18 MED ORDER — SODIUM CHLORIDE 0.9 % IV BOLUS (SEPSIS)
1000.0000 mL | Freq: Once | INTRAVENOUS | Status: AC
  Administered 2011-07-18: 1000 mL via INTRAVENOUS

## 2011-07-18 MED ORDER — POLYETHYLENE GLYCOL 3350 17 G PO PACK: 17.0000 g | PACK | Freq: Every day | ORAL | Status: AC

## 2011-07-18 NOTE — ED Provider Notes (Signed)
History     CSN: [id number]  Arrival date & time 07/18/11  0141   First MD Initiated Contact with Patient 07/18/11 [phone]      Chief Complaint  Patient presents with  . Abdominal Pain    (Consider location/radiation/quality/duration/timing/severity/associated sxs/prior treatment) HPI Pt has had 3 days of intermittent LLQ pain without urinary, vaginal complaints. Pt states she has had episodic lightheadedness. No focal weakness, fever, chills, CP.  Past Medical History  Diagnosis Date  . Spontaneous abortion 12/03/2010    Past Surgical History  Procedure Date  . Dilation and evacuation 12/03/2010    Procedure: DILATATION AND EVACUATION (D&E);  Surgeon: Roseanna Rainbow, MD;  Location: [facility];  Service: Gynecology;  Laterality: N/A;    History reviewed. No pertinent family history.  History  Substance Use Topics  . Smoking status: Never Smoker   . Smokeless tobacco: Not on file  . Alcohol Use: Yes     occ    OB History    Grav Para Term Preterm Abortions TAB SAB Ect Mult Living   1    1  1          Review of Systems  Constitutional: Negative for fever and chills.  HENT: Negative for sore throat and neck pain.   Respiratory: Negative for cough and shortness of breath.   Cardiovascular: Negative for chest pain, palpitations and leg swelling.  Gastrointestinal: Positive for abdominal pain. Negative for nausea, vomiting and diarrhea.  Genitourinary: Negative for dysuria, flank pain, vaginal bleeding, vaginal discharge, difficulty urinating and pelvic pain.  Musculoskeletal: Negative for back pain.  Skin: Negative for color change, pallor and rash.  Neurological: Positive for dizziness and light-headedness. Negative for weakness, numbness and headaches.    Allergies  Shellfish allergy and Latex  Home Medications   Current Outpatient Rx  Name Route Sig Dispense Refill  . POLYETHYLENE GLYCOL 3350 PO PACK Oral Take 17 g by mouth daily. 14 each 0    BP 120/81   Pulse 88  Temp(Src) 98.2 F (36.8 C) (Oral)  Resp 20  SpO2 100%  LMP 06/14/2011  Physical Exam  Nursing note and vitals reviewed. Constitutional: She is oriented to person, place, and time. She appears well-developed and well-nourished. No distress.  HENT:  Head: Normocephalic and atraumatic.  Mouth/Throat: Oropharynx is clear and moist.  Eyes: EOM are normal. Pupils are equal, round, and reactive to light.  Neck: Normal range of motion. Neck supple.  Cardiovascular: Normal rate and regular rhythm.   Pulmonary/Chest: Effort normal and breath sounds normal. No respiratory distress. She has no wheezes. She has no rales.  Abdominal: Soft. Bowel sounds are normal. There is Tenderness: LLQ pain without guarding or rebound.. There is no rebound and no guarding.  Genitourinary: Vagina normal and uterus normal. No vaginal discharge found.       No adnexal ttp or CMT  Musculoskeletal: Normal range of motion. She exhibits no edema and no tenderness.  Neurological: She is alert and oriented to person, place, and time.  Skin: Skin is warm and dry. No rash noted. No erythema.  Psychiatric: She has a normal mood and affect. Her behavior is normal.    ED Course  Procedures (including critical care time)  Labs Reviewed  URINALYSIS, ROUTINE W REFLEX MICROSCOPIC - Abnormal; Notable for the following:    APPearance CLOUDY (*)    Hgb urine dipstick TRACE (*)    All other components within normal limits  POCT PREGNANCY, URINE  CBC  DIFFERENTIAL  COMPREHENSIVE METABOLIC PANEL  URINE MICROSCOPIC-ADD ON  WET PREP, GENITAL  GC/CHLAMYDIA PROBE AMP, GENITAL   No results found.   1. Constipation       MDM  Pt states her abd pain comes in waves and she has not had a BM for "a while". She has tried OTC laxative with no success. States the her dizziness comes one during waves of abd pain.         Loren Racer, MD 07/18/11 [phone]

## 2011-07-18 NOTE — ED Notes (Signed)
Patient reports being lightheaded and feels like she is going to pass out x 3 days also states left sided abdominal pain rates it 6/10.

## 2011-07-18 NOTE — ED Notes (Signed)
abd pain for 3 days with ahcing all over and nv lmp jan 22nd

## 2011-12-17 ENCOUNTER — Emergency Department (HOSPITAL_COMMUNITY)
Admission: EM | Admit: 2011-12-17 | Discharge: 2011-12-17 | Disposition: A | Discharge status: Home / Self Care | Payer: BC Managed Care – PPO | Attending: Emergency Medicine | Admitting: Emergency Medicine

## 2011-12-17 ENCOUNTER — Encounter (HOSPITAL_COMMUNITY): Payer: Self-pay | Admitting: Emergency Medicine

## 2011-12-17 ENCOUNTER — Emergency Department (HOSPITAL_COMMUNITY): Payer: BC Managed Care – PPO

## 2011-12-17 DIAGNOSIS — J40 Bronchitis, not specified as acute or chronic: Secondary | ICD-10-CM | POA: Insufficient documentation

## 2011-12-17 DIAGNOSIS — R0789 Other chest pain: Secondary | ICD-10-CM

## 2011-12-17 DIAGNOSIS — R064 Hyperventilation: Secondary | ICD-10-CM

## 2011-12-17 DIAGNOSIS — R55 Syncope and collapse: Secondary | ICD-10-CM

## 2011-12-17 DIAGNOSIS — R071 Chest pain on breathing: Secondary | ICD-10-CM | POA: Insufficient documentation

## 2011-12-17 LAB — URINALYSIS, ROUTINE W REFLEX MICROSCOPIC
Bilirubin Urine: NEGATIVE
Glucose, UA: NEGATIVE mg/dL
Ketones, ur: NEGATIVE mg/dL
Specific Gravity, Urine: 1.022 (ref 1.005–1.030)
pH: 6 (ref 5.0–8.0)

## 2011-12-17 LAB — CBC WITH DIFFERENTIAL/PLATELET
Basophils Absolute: 0 10*3/uL (ref 0.0–0.1)
Eosinophils Absolute: 0.3 10*3/uL (ref 0.0–0.7)
Eosinophils Relative: 3 % (ref 0–5)
Lymphocytes Relative: 36 % (ref 12–46)
Lymphs Abs: 3.5 10*3/uL (ref 0.7–4.0)
MCH: 30.2 pg (ref 26.0–34.0)
Neutrophils Relative %: 54 % (ref 43–77)
Platelets: 299 10*3/uL (ref 150–400)
RBC: 4.57 MIL/uL (ref 3.87–5.11)
RDW: 13.4 % (ref 11.5–15.5)
WBC: 9.6 10*3/uL (ref 4.0–10.5)

## 2011-12-17 LAB — COMPREHENSIVE METABOLIC PANEL
ALT: 14 U/L (ref 0–35)
AST: 19 U/L (ref 0–37)
Alkaline Phosphatase: 105 U/L (ref 39–117)
Calcium: 9.4 mg/dL (ref 8.4–10.5)
GFR calc Af Amer: >90 mL/min (ref >90)
Glucose, Bld: 91 mg/dL (ref 70–99)
Potassium: 3.4 mEq/L — ABNORMAL LOW (ref 3.5–5.1)
Sodium: 138 mEq/L (ref 135–145)
Total Protein: 7.4 g/dL (ref 6.0–8.3)

## 2011-12-17 LAB — URINE MICROSCOPIC-ADD ON

## 2011-12-17 MED ORDER — METHOCARBAMOL 500 MG PO TABS: ORAL_TABLET | ORAL | Status: DC

## 2011-12-17 MED ORDER — KETOROLAC TROMETHAMINE 30 MG/ML IJ SOLN
30.0000 mg | Freq: Once | INTRAMUSCULAR | Status: AC
  Administered 2011-12-17: 30 mg via INTRAVENOUS
  Filled 2011-12-17: qty 1

## 2011-12-17 MED ORDER — AZITHROMYCIN 250 MG PO TABS: ORAL_TABLET | ORAL | Status: AC

## 2011-12-17 NOTE — ED Provider Notes (Signed)
History     CSN: [id number]  Arrival date & time 12/17/11  0123   First MD Initiated Contact with Patient 12/17/11 [phone]      Chief Complaint  Patient presents with  . Abdominal Pain    (Consider location/radiation/quality/duration/timing/severity/associated sxs/prior treatment) HPI  Patient states yesterday about 3:38 PM she started having pain in her left upper quadrant/chest wall that she describes as being sharp. She reports about 12 midnight she started feeling short of breath and then her hands and legs got numb and tingly. She relates she was screaming and running to her parent's room in stable reports she passed out. Her brother states she was out possibly 30 seconds and then she came to and was going in and out for a period of time. He states he carried her outside to get fresh air. She relates she's had a cough that is dry for the past few days. She is no chills but has felt cold. She's had nausea without vomiting or diarrhea. She denies sore throat for dizziness but does state she feels weak on standing.  PCP Dr. Haskel Schroeder   Past Medical History  Diagnosis Date  . Spontaneous abortion 12/03/2010    Past Surgical History  Procedure Date  . Dilation and evacuation 12/03/2010    Procedure: DILATATION AND EVACUATION (D&E);  Surgeon: Roseanna Rainbow, MD;  Location: [facility];  Service: Gynecology;  Laterality: N/A;  . Ovarian cyst removal     Family History  Problem Relation Age of Onset  . Hypertension Other   . Diabetes Other   . Stroke Other   . Cancer Other   . Heart failure Other   . Seizures Other     History  Substance Use Topics  . Smoking status: Never Smoker   . Smokeless tobacco: Not on file  . Alcohol Use: Yes     occ  employed  OB History    Grav Para Term Preterm Abortions TAB SAB Ect Mult Living   1    1  1          Review of Systems  All other systems reviewed and are negative.    Allergies  Shellfish allergy and Latex  Home  Medications   Current Outpatient Rx  Name Route Sig Dispense Refill  . OVER THE COUNTER MEDICATION Oral Take 1 capsule by mouth 3 (three) times daily. hydroxycut max for women    . PSEUDOEPHEDRINE-ACETAMINOPHEN 30-500 MG PO TABS Oral Take 1 tablet by mouth every 4 (four) hours as needed. For sinus pain     BP 130/64  Pulse 71  Temp 98.7 F (37.1 C) (Oral)  Resp 18  LMP 12/09/2011  Pulse ox 100 % on RA  Vital signs normal     Physical Exam  Nursing note and vitals reviewed. Constitutional: She is oriented to person, place, and time. She appears well-developed and well-nourished.  Non-toxic appearance. She does not appear ill. No distress.  HENT:  Head: Normocephalic and atraumatic.  Right Ear: External ear normal.  Left Ear: External ear normal.  Nose: Nose normal. No mucosal edema or rhinorrhea.  Mouth/Throat: Oropharynx is clear and moist and mucous membranes are normal. No dental abscesses or uvula swelling.  Eyes: Conjunctivae and EOM are normal. Pupils are equal, round, and reactive to light.  Neck: Normal range of motion and full passive range of motion without pain. Neck supple.  Cardiovascular: Normal rate, regular rhythm and normal heart sounds.  Exam reveals no gallop and  no friction rub.   No murmur heard. Pulmonary/Chest: Effort normal and breath sounds normal. No respiratory distress. She has no wheezes. She has no rhonchi. She has no rales. She exhibits tenderness. She exhibits no crepitus.    Abdominal: Soft. Normal appearance and bowel sounds are normal. She exhibits no distension. There is no tenderness. There is no rebound and no guarding.  Musculoskeletal: Normal range of motion. She exhibits no edema and no tenderness.       Moves all extremities well.   Neurological: She is alert and oriented to person, place, and time. She has normal strength. No cranial nerve deficit.  Skin: Skin is warm, dry and intact. No rash noted. No erythema. No pallor.  Psychiatric:  She has a normal mood and affect. Her speech is normal and behavior is normal. Her mood appears not anxious.    ED Course  Procedures (including critical care time)   Medications  ketorolac (TORADOL) 30 MG/ML injection 30 mg (30 mg Intravenous Given 12/17/11 0459)   Patient reports she has had no pain since she got the Toradol IV.  Results for orders placed during the hospital encounter of 12/17/11  CBC WITH DIFFERENTIAL      Component Value Range   WBC 9.6  4.0 - 10.5 K/uL   RBC 4.57  3.87 - 5.11 MIL/uL   Hemoglobin 13.8  12.0 - 15.0 g/dL   HCT 96.2  95.2 - 84.1 %   MCV 88.8  78.0 - 100.0 fL   MCH 30.2  26.0 - 34.0 pg   MCHC 34.0  30.0 - 36.0 g/dL   RDW 32.4  40.1 - 02.7 %   Platelets 299  150 - 400 K/uL   Neutrophils Relative 54  43 - 77 %   Neutro Abs 5.1  1.7 - 7.7 K/uL   Lymphocytes Relative 36  12 - 46 %   Lymphs Abs 3.5  0.7 - 4.0 K/uL   Monocytes Relative 7  3 - 12 %   Monocytes Absolute 0.6  0.1 - 1.0 K/uL   Eosinophils Relative 3  0 - 5 %   Eosinophils Absolute 0.3  0.0 - 0.7 K/uL   Basophils Relative 0  0 - 1 %   Basophils Absolute 0.0  0.0 - 0.1 K/uL  COMPREHENSIVE METABOLIC PANEL      Component Value Range   Sodium 138  135 - 145 mEq/L   Potassium 3.4 (*) 3.5 - 5.1 mEq/L   Chloride 102  96 - 112 mEq/L   CO2 24  19 - 32 mEq/L   Glucose, Bld 91  70 - 99 mg/dL   BUN 8  6 - 23 mg/dL   Creatinine, Ser 2.53  0.50 - 1.10 mg/dL   Calcium 9.4  8.4 - 66.4 mg/dL   Total Protein 7.4  6.0 - 8.3 g/dL   Albumin 4.2  3.5 - 5.2 g/dL   AST 19  0 - 37 U/L   ALT 14  0 - 35 U/L   Alkaline Phosphatase 105  39 - 117 U/L   Total Bilirubin 0.3  0.3 - 1.2 mg/dL   GFR calc non Af Amer >90  >90 mL/min   GFR calc Af Amer >90  >90 mL/min  URINALYSIS, ROUTINE W REFLEX MICROSCOPIC      Component Value Range   Color, Urine YELLOW  YELLOW   APPearance CLOUDY (*) CLEAR   Specific Gravity, Urine 1.022  1.005 - 1.030   pH 6.0  5.0 -  8.0   Glucose, UA NEGATIVE  NEGATIVE mg/dL   Hgb  urine dipstick TRACE (*) NEGATIVE   Bilirubin Urine NEGATIVE  NEGATIVE   Ketones, ur NEGATIVE  NEGATIVE mg/dL   Protein, ur NEGATIVE  NEGATIVE mg/dL   Urobilinogen, UA 0.2  0.0 - 1.0 mg/dL   Nitrite NEGATIVE  NEGATIVE   Leukocytes, UA NEGATIVE  NEGATIVE  PREGNANCY, URINE      Component Value Range   Preg Test, Ur NEGATIVE  NEGATIVE  URINE MICROSCOPIC-ADD ON      Component Value Range   Squamous Epithelial / LPF RARE  RARE   WBC, UA 0-2  <3 WBC/hpf   RBC / HPF 0-2  <3 RBC/hpf   Bacteria, UA FEW (*) RARE   Urine-Other MUCOUS PRESENT     Laboratory interpretation all normal except mild hyponatremia   Dg Ribs Unilateral W/chest Left  12/17/2011  *RADIOLOGY REPORT*  Clinical Data: Left-sided rib pain status post fall.  LEFT RIBS AND CHEST - 3+ VIEW  Comparison: 03/30/2007 chest radiograph  Findings: Lungs are clear.  Cardiomediastinal contours within normal range.  No pneumothorax.  No displaced rib fracture.  IMPRESSION: No displaced fracture identified.  Clear lungs.  Original Report Authenticated By: Waneta Martins, M.D.     Date: 12/17/2011  Rate: 71  Rhythm: normal sinus rhythm  QRS Axis: normal  Intervals: normal  ST/T Wave abnormalities: normal  Conduction Disutrbances:none  Narrative Interpretation:   Old EKG Reviewed: none available    1. Bronchitis   2. Chest wall pain   3. Syncopal episodes   4. Hyperventilation      Patient's Medications  New Prescriptions   AZITHROMYCIN (ZITHROMAX Z-PAK) 250 MG TABLET    Take 2 po the first day then once a day for the next 4 days.   METHOCARBAMOL (ROBAXIN) 500 MG TABLET    Take 1 or 2 po Q 6hrs for muscle soreness    Plan discharge  Devoria Albe, MD, FACEP    MDM          Ward Givens, MD 12/17/11 1610  Ward Givens, MD 12/17/11 9604  Ward Givens, MD 12/17/11 0745

## 2011-12-17 NOTE — ED Notes (Signed)
Pt attempted at urine sample. Was not able to obtain enough for sample

## 2011-12-17 NOTE — ED Notes (Signed)
Per EMS pt started having abd pain about 3pm on Friday that has continued to get worse  Pt is c/o LUQ pain  Denies N/V/D

## 2012-05-23 NOTE — L&D Delivery Note (Signed)
This patient has no babies on file.This patient has no babies on file. 

## 2012-09-21 ENCOUNTER — Ambulatory Visit (INDEPENDENT_AMBULATORY_CARE_PROVIDER_SITE_OTHER): Payer: BC Managed Care – PPO | Admitting: Emergency Medicine

## 2012-09-21 VITALS — BP 130/70 | HR 80 | Temp 98.0°F | Resp 18 | Ht 64.0 in | Wt 184.8 lb

## 2012-09-21 DIAGNOSIS — N912 Amenorrhea, unspecified: Secondary | ICD-10-CM

## 2012-09-21 DIAGNOSIS — R350 Frequency of micturition: Secondary | ICD-10-CM

## 2012-09-21 DIAGNOSIS — O0001 Abdominal pregnancy with intrauterine pregnancy: Secondary | ICD-10-CM

## 2012-09-21 LAB — POCT UA - MICROSCOPIC ONLY
Casts, Ur, LPF, POC: NEGATIVE
Yeast, UA: NEGATIVE

## 2012-09-21 LAB — POCT URINALYSIS DIPSTICK
Glucose, UA: NEGATIVE
Nitrite, UA: NEGATIVE
Protein, UA: NEGATIVE
Urobilinogen, UA: 0.2

## 2012-09-21 LAB — POCT URINE PREGNANCY: Preg Test, Ur: POSITIVE

## 2012-09-21 MED ORDER — PRENATAL 28-0.8 MG PO TABS: 1.0000 | ORAL_TABLET | Freq: Every day | ORAL | Status: DC

## 2012-09-21 NOTE — Patient Instructions (Addendum)

## 2012-09-21 NOTE — Progress Notes (Signed)
[facility] [address], [place] [address] [phone]  Date:  09/21/2012   Name:  Carla [name]   DOB:  1990/06/01   MRN:  [id number]  PCP:  Karie Chimera, MD    Chief Complaint: Urinary Tract Infection   History of Present Illness:  Carla [name] is a 22 y.o. very pleasant female patient who presents with the following:  G3P0A3.  LMP 3/28.  Has dysuria, frequency and right flank pain.  Nauseated but no vomiting.  No gyn symptoms.  No GI symptoms.  No fever or chills. Had negative HCG a week ago.  No improvement with over the counter medications or other home remedies. Denies other complaint or health concern today.   Patient Active Problem List   Diagnosis Date Noted  . Spontaneous abortion 12/03/2010    Past Medical History  Diagnosis Date  . Spontaneous abortion 12/03/2010  . Allergy   . Depression     Past Surgical History  Procedure Laterality Date  . Dilation and evacuation  12/03/2010    Procedure: DILATATION AND EVACUATION (D&E);  Surgeon: Roseanna Rainbow, MD;  Location: [facility];  Service: Gynecology;  Laterality: N/A;  . Ovarian cyst removal      History  Substance Use Topics  . Smoking status: Never Smoker   . Smokeless tobacco: Not on file  . Alcohol Use: Yes     Comment: occ    Family History  Problem Relation Age of Onset  . Hypertension Other   . Diabetes Other   . Stroke Other   . Cancer Other   . Heart failure Other   . Seizures Other     Allergies  Allergen Reactions  . Shellfish Allergy Anaphylaxis  . Latex Itching    Medication list has been reviewed and updated.  No current outpatient prescriptions on file prior to visit.   No current facility-administered medications on file prior to visit.    Review of Systems:  As per HPI, otherwise negative.    Physical Examination: Filed Vitals:   09/21/12 1126  BP: 130/70  Pulse: 80  Temp: 98 F (36.7 C)  Resp: 18   Filed Vitals:   09/21/12  1126  Height: 5\' 4"  (1.626 m)  Weight: 184 lb 12.8 oz (83.825 kg)   Body mass index is 31.71 kg/(m^2). Ideal Body Weight: Weight in (lb) to have BMI = 25: 145.3  GEN: WDWN, NAD, Non-toxic, A & O x 3 HEENT: Atraumatic, Normocephalic. Neck supple. No masses, No LAD. Ears and Nose: No external deformity. CV: RRR, No M/G/R. No JVD. No thrill. No extra heart sounds. PULM: CTA B, no wheezes, crackles, rhonchi. No retractions. No resp. distress. No accessory muscle use. ABD: S, NT, ND, +BS. No rebound. No HSM. EXTR: No c/c/e NEURO Normal gait.  PSYCH: Normally interactive. Conversant. Not depressed or anxious appearing.  Calm demeanor.    Assessment and Plan: Pregnancy OB consult Prenatal vitamins   Signed,  Phillips Odor, MD  Results for orders placed in visit on 09/21/12  POCT UA - MICROSCOPIC ONLY      Result Value Range   WBC, Ur, HPF, POC 0-1     RBC, urine, microscopic 2-3     Bacteria, U Microscopic small     Mucus, UA trace     Epithelial cells, urine per micros 2-4     Crystals, Ur, HPF, POC neg     Casts, Ur, LPF, POC neg  Yeast, UA neg    POCT URINALYSIS DIPSTICK      Result Value Range   Color, UA yellow     Clarity, UA clear     Glucose, UA neg     Bilirubin, UA neg     Ketones, UA trace     Spec Grav, UA 1.020     Blood, UA small     pH, UA 6.5     Protein, UA neg     Urobilinogen, UA 0.2     Nitrite, UA neg     Leukocytes, UA Negative    POCT URINE PREGNANCY      Result Value Range   Preg Test, Ur Positive

## 2012-10-30 ENCOUNTER — Inpatient Hospital Stay (HOSPITAL_COMMUNITY): Payer: BC Managed Care – PPO

## 2012-10-30 ENCOUNTER — Encounter (HOSPITAL_COMMUNITY): Payer: Self-pay

## 2012-10-30 ENCOUNTER — Inpatient Hospital Stay (HOSPITAL_COMMUNITY)
Admission: AD | Admit: 2012-10-30 | Discharge: 2012-10-30 | Disposition: A | Discharge status: Home / Self Care | Payer: BC Managed Care – PPO | Source: Ambulatory Visit | Attending: Obstetrics | Admitting: Obstetrics

## 2012-10-30 DIAGNOSIS — O99611 Diseases of the digestive system complicating pregnancy, first trimester: Secondary | ICD-10-CM

## 2012-10-30 DIAGNOSIS — K5900 Constipation, unspecified: Secondary | ICD-10-CM | POA: Insufficient documentation

## 2012-10-30 DIAGNOSIS — O210 Mild hyperemesis gravidarum: Secondary | ICD-10-CM | POA: Insufficient documentation

## 2012-10-30 DIAGNOSIS — O219 Vomiting of pregnancy, unspecified: Secondary | ICD-10-CM

## 2012-10-30 DIAGNOSIS — O99891 Other specified diseases and conditions complicating pregnancy: Secondary | ICD-10-CM | POA: Insufficient documentation

## 2012-10-30 LAB — URINALYSIS, ROUTINE W REFLEX MICROSCOPIC
Bilirubin Urine: NEGATIVE
Ketones, ur: NEGATIVE mg/dL
Leukocytes, UA: NEGATIVE
Nitrite: NEGATIVE
Protein, ur: NEGATIVE mg/dL

## 2012-10-30 MED ORDER — PROMETHAZINE HCL 25 MG PO TABS
25.0000 mg | ORAL_TABLET | Freq: Once | ORAL | Status: AC
  Administered 2012-10-30: 25 mg via ORAL
  Filled 2012-10-30: qty 1

## 2012-10-30 MED ORDER — PROMETHAZINE HCL 25 MG PO TABS: ORAL_TABLET | ORAL | Status: DC

## 2012-10-30 MED ORDER — POLYETHYLENE GLYCOL 3350 17 G PO PACK: 17.0000 g | PACK | Freq: Two times a day (BID) | ORAL | Status: DC

## 2012-10-30 NOTE — MAU Provider Note (Signed)
History     CSN: [id number]  Arrival date and time: 10/30/12 1604   None     Chief Complaint  Patient presents with  . Emesis  . Constipation   HPI 22 y.o. G2P0010 at [redacted]w[redacted]d with n/v since onset of pregnancy, got worse about 1 week ago. Also c/o constipation x 1 week. Tried Miralax, but can't keep it down. Can eat and drink, but won't stay down for long. No meds at home for nausea.   Past Medical History  Diagnosis Date  . Spontaneous abortion 12/03/2010  . Allergy   . Depression     Past Surgical History  Procedure Laterality Date  . Dilation and evacuation  12/03/2010    Procedure: DILATATION AND EVACUATION (D&E);  Surgeon: Roseanna Rainbow, MD;  Location: [facility];  Service: Gynecology;  Laterality: N/A;  . Ovarian cyst removal    . Tonsillectomy      Family History  Problem Relation Age of Onset  . Hypertension Other   . Diabetes Other   . Stroke Other   . Cancer Other   . Heart failure Other   . Seizures Other     History  Substance Use Topics  . Smoking status: Never Smoker   . Smokeless tobacco: Not on file  . Alcohol Use: Yes     Comment: occ    Allergies:  Allergies  Allergen Reactions  . Shellfish Allergy Anaphylaxis  . Latex Itching    Prescriptions prior to admission  Medication Sig Dispense Refill  . acetaminophen (TYLENOL) 325 MG tablet Take 650 mg by mouth every 6 (six) hours as needed for pain (headache).      . Ginger, Zingiber officinalis, (GINGER ROOT PO) Take by mouth.      . Prenatal 28-0.8 MG TABS Take 1 tablet by mouth daily.  30 each  12    Review of Systems  Constitutional: Negative.  Negative for fever and chills.  Respiratory: Negative.   Cardiovascular: Negative.   Gastrointestinal: Positive for nausea, vomiting and constipation. Negative for abdominal pain and diarrhea.  Genitourinary: Negative for dysuria, urgency, frequency, hematuria and flank pain.       Negative for vaginal bleeding, vaginal discharge, dyspareunia   Musculoskeletal: Negative.   Neurological: Negative.   Psychiatric/Behavioral: Negative.    Physical Exam   Blood pressure 113/82, pulse 72, temperature 98.1 F (36.7 C), temperature source Oral, resp. rate 16, height 5\' 5"  (1.651 m), weight 180 lb 3.2 oz (81.738 kg), last menstrual period 08/07/2012, SpO2 100.00%.  Physical Exam  Nursing note and vitals reviewed. Constitutional: She is oriented to person, place, and time. She appears well-developed and well-nourished. No distress.  Cardiovascular: Normal rate.   Respiratory: Effort normal.  GI: Soft. She exhibits no distension and no mass. There is no tenderness. There is no rebound and no guarding.  Musculoskeletal: Normal range of motion.  Neurological: She is alert and oriented to person, place, and time.  Skin: Skin is warm and dry.  Psychiatric: She has a normal mood and affect.   Unable to doppler FHR  MAU Course  Procedures Results for orders placed during the hospital encounter of 10/30/12 (from the past 24 hour(s))  URINALYSIS, ROUTINE W REFLEX MICROSCOPIC     Status: Abnormal   Collection Time    10/30/12  5:15 PM      Result Value Range   Color, Urine YELLOW  YELLOW   APPearance HAZY (*) CLEAR   Specific Gravity, Urine 1.025  1.005 -  1.030   pH 8.0  5.0 - 8.0   Glucose, UA NEGATIVE  NEGATIVE mg/dL   Hgb urine dipstick NEGATIVE  NEGATIVE   Bilirubin Urine NEGATIVE  NEGATIVE   Ketones, ur NEGATIVE  NEGATIVE mg/dL   Protein, ur NEGATIVE  NEGATIVE mg/dL   Urobilinogen, UA 1.0  0.0 - 1.0 mg/dL   Nitrite NEGATIVE  NEGATIVE   Leukocytes, UA NEGATIVE  NEGATIVE   Phenergan 25 mg PO x 1 in MAU  U/S: [redacted]w[redacted]d IUP, + FHR  Assessment and Plan   1. Nausea and vomiting in pregnancy prior to [redacted] weeks gestation   2. Constipation in pregnancy in first trimester   Phenergan for n/v, miralax for constipation as below Start prenatal care     Medication List    TAKE these medications       acetaminophen 325 MG tablet   Commonly known as:  TYLENOL  Take 650 mg by mouth every 6 (six) hours as needed for pain (headache).     GINGER ROOT PO  Take by mouth.     polyethylene glycol packet  Commonly known as:  MIRALAX / GLYCOLAX  Take 17 g by mouth 2 (two) times daily.     Prenatal 28-0.8 MG Tabs  Take 1 tablet by mouth daily.     promethazine 25 MG tablet  Commonly known as:  PHENERGAN  1 po or vaginally q 4-6 hours PRN nausea and vomiting            Follow-up Information   Follow up with [facility] (as scheduled)    Contact information:   [address] [place] [address] [phone]        Georges Mouse 10/30/2012, 5:52 PM

## 2012-10-30 NOTE — MAU Note (Signed)
Patient states she has had nausea and vomiting from the beginning of the pregnancy and it had stopped. Became constipated and has had on and off nausea and vomiting since Friday. No BM in one week. Denies bleeding or discharge. No pain.

## 2012-11-01 ENCOUNTER — Ambulatory Visit (INDEPENDENT_AMBULATORY_CARE_PROVIDER_SITE_OTHER): Payer: BC Managed Care – PPO | Admitting: Obstetrics & Gynecology

## 2012-11-01 ENCOUNTER — Encounter: Payer: Self-pay | Admitting: Obstetrics & Gynecology

## 2012-11-01 VITALS — BP 109/70 | Temp 98.2°F | Wt 180.0 lb

## 2012-11-01 DIAGNOSIS — Z3201 Encounter for pregnancy test, result positive: Secondary | ICD-10-CM

## 2012-11-01 DIAGNOSIS — Z3401 Encounter for supervision of normal first pregnancy, first trimester: Secondary | ICD-10-CM

## 2012-11-01 NOTE — Progress Notes (Signed)
Pulse- 80 Pap- 2013

## 2012-11-02 ENCOUNTER — Other Ambulatory Visit: Payer: Self-pay | Admitting: *Deleted

## 2012-11-02 DIAGNOSIS — O219 Vomiting of pregnancy, unspecified: Secondary | ICD-10-CM

## 2012-11-02 LAB — OBSTETRIC PANEL
Antibody Screen: NEGATIVE
Basophils Absolute: 0 10*3/uL (ref 0.0–0.1)
Basophils Relative: 0 % (ref 0–1)
Eosinophils Absolute: 0 10*3/uL (ref 0.0–0.7)
HCT: 39.5 % (ref 36.0–46.0)
MCH: 30 pg (ref 26.0–34.0)
MCHC: 33.9 g/dL (ref 30.0–36.0)
Monocytes Absolute: 0.4 10*3/uL (ref 0.1–1.0)
Neutro Abs: 6.7 10*3/uL (ref 1.7–7.7)
Neutrophils Relative %: 73 % (ref 43–77)
RDW: 14 % (ref 11.5–15.5)
Rh Type: POSITIVE

## 2012-11-02 LAB — VARICELLA ZOSTER ANTIBODY, IGG: Varicella IgG: 1713 Index — ABNORMAL HIGH (ref <135.00)

## 2012-11-02 MED ORDER — ONDANSETRON 8 MG PO TBDP: 8.0000 mg | ORAL_TABLET | Freq: Three times a day (TID) | ORAL | Status: DC | PRN

## 2012-11-03 LAB — CULTURE, OB URINE: Organism ID, Bacteria: NO GROWTH

## 2012-11-04 NOTE — MAU Provider Note (Signed)

## 2012-11-05 LAB — HEMOGLOBINOPATHY EVALUATION
Hgb A2 Quant: 1.3 % — ABNORMAL LOW (ref 2.2–3.2)
Hgb A: 98.2 % — ABNORMAL HIGH (ref 96.8–97.8)
Hgb F Quant: 0.5 % (ref 0.0–2.0)

## 2012-11-14 ENCOUNTER — Inpatient Hospital Stay (HOSPITAL_COMMUNITY)
Admission: AD | Admit: 2012-11-14 | Discharge: 2012-11-16 | DRG: 886 | Disposition: A | Discharge status: Home / Self Care | Payer: BC Managed Care – PPO | Source: Ambulatory Visit | Attending: Obstetrics & Gynecology | Admitting: Obstetrics & Gynecology

## 2012-11-14 ENCOUNTER — Encounter (HOSPITAL_COMMUNITY): Payer: Self-pay | Admitting: *Deleted

## 2012-11-14 ENCOUNTER — Ambulatory Visit (INDEPENDENT_AMBULATORY_CARE_PROVIDER_SITE_OTHER): Payer: BC Managed Care – PPO | Admitting: Obstetrics & Gynecology

## 2012-11-14 VITALS — BP 122/84 | Temp 98.1°F | Wt 177.0 lb

## 2012-11-14 DIAGNOSIS — O99891 Other specified diseases and conditions complicating pregnancy: Secondary | ICD-10-CM | POA: Diagnosis present

## 2012-11-14 DIAGNOSIS — E440 Moderate protein-calorie malnutrition: Secondary | ICD-10-CM | POA: Diagnosis present

## 2012-11-14 DIAGNOSIS — Z3400 Encounter for supervision of normal first pregnancy, unspecified trimester: Secondary | ICD-10-CM

## 2012-11-14 DIAGNOSIS — Z3401 Encounter for supervision of normal first pregnancy, first trimester: Secondary | ICD-10-CM

## 2012-11-14 DIAGNOSIS — O210 Mild hyperemesis gravidarum: Secondary | ICD-10-CM

## 2012-11-14 DIAGNOSIS — N390 Urinary tract infection, site not specified: Secondary | ICD-10-CM | POA: Diagnosis present

## 2012-11-14 DIAGNOSIS — O2390 Unspecified genitourinary tract infection in pregnancy, unspecified trimester: Secondary | ICD-10-CM | POA: Diagnosis present

## 2012-11-14 HISTORY — DX: Headache: R51

## 2012-11-14 HISTORY — DX: Other specified postprocedural states: R11.2

## 2012-11-14 HISTORY — DX: Other specified postprocedural states: Z98.890

## 2012-11-14 LAB — URINALYSIS, ROUTINE W REFLEX MICROSCOPIC
Glucose, UA: NEGATIVE mg/dL
Ketones, ur: 40 mg/dL — AB
Leukocytes, UA: NEGATIVE
Protein, ur: NEGATIVE mg/dL
Urobilinogen, UA: 0.2 mg/dL (ref 0.0–1.0)

## 2012-11-14 LAB — URINE MICROSCOPIC-ADD ON

## 2012-11-14 LAB — COMPREHENSIVE METABOLIC PANEL
ALT: 59 U/L — ABNORMAL HIGH (ref 0–35)
AST: 32 U/L (ref 0–37)
Alkaline Phosphatase: 71 U/L (ref 39–117)
CO2: 24 mEq/L (ref 19–32)
Calcium: 10 mg/dL (ref 8.4–10.5)
Chloride: 99 mEq/L (ref 96–112)
GFR calc Af Amer: >90 mL/min (ref >90)
GFR calc non Af Amer: >90 mL/min (ref >90)
Glucose, Bld: 84 mg/dL (ref 70–99)
Potassium: 4.1 mEq/L (ref 3.5–5.1)
Sodium: 134 mEq/L — ABNORMAL LOW (ref 135–145)
Total Bilirubin: 0.7 mg/dL (ref 0.3–1.2)

## 2012-11-14 LAB — CBC
MCH: 31.5 pg (ref 26.0–34.0)
MCHC: 36 g/dL (ref 30.0–36.0)
Platelets: 234 10*3/uL (ref 150–400)
RDW: 13.3 % (ref 11.5–15.5)

## 2012-11-14 LAB — POCT URINALYSIS DIPSTICK
Bilirubin, UA: NEGATIVE
Glucose, UA: NEGATIVE
Leukocytes, UA: NEGATIVE
Nitrite, UA: NEGATIVE

## 2012-11-14 LAB — MAGNESIUM: Magnesium: 2.1 mg/dL (ref 1.5–2.5)

## 2012-11-14 MED ORDER — ONDANSETRON HCL 4 MG PO TABS: 4.0000 mg | ORAL_TABLET | Freq: Four times a day (QID) | ORAL | Status: DC | PRN

## 2012-11-14 MED ORDER — PYRIDOXINE HCL 100 MG/ML IJ SOLN: 30.0000 mg | Freq: Every day | INTRAMUSCULAR | Status: DC

## 2012-11-14 MED ORDER — M.V.I. ADULT IV INJ
Freq: Once | INTRAVENOUS | Status: DC
  Filled 2012-11-14: qty 1000

## 2012-11-14 MED ORDER — PYRIDOXINE HCL 100 MG/ML IJ SOLN
30.0000 mg | Freq: Every day | INTRAMUSCULAR | Status: DC
  Filled 2012-11-14 (×2): qty 0.3

## 2012-11-14 MED ORDER — DEXTROSE 5 % IV SOLN
1.0000 g | Freq: Once | INTRAVENOUS | Status: AC
  Administered 2012-11-14: 1 g via INTRAVENOUS
  Filled 2012-11-14: qty 10

## 2012-11-14 MED ORDER — KCL IN DEXTROSE-NACL 10-5-0.45 MEQ/L-%-% IV SOLN
INTRAVENOUS | Status: DC
  Administered 2012-11-14: via INTRAVENOUS
  Filled 2012-11-14 (×4): qty 1000

## 2012-11-14 MED ORDER — FOLIC ACID 5 MG/ML IJ SOLN: 600.0000 ug | Freq: Every day | INTRAMUSCULAR | Status: DC

## 2012-11-14 MED ORDER — ONDANSETRON HCL 4 MG/2ML IJ SOLN: 4.0000 mg | Freq: Four times a day (QID) | INTRAMUSCULAR | Status: DC | PRN

## 2012-11-14 MED ORDER — SIMETHICONE 80 MG PO CHEW: 80.0000 mg | CHEWABLE_TABLET | Freq: Four times a day (QID) | ORAL | Status: DC | PRN

## 2012-11-14 MED ORDER — METOCLOPRAMIDE HCL 5 MG/ML IJ SOLN
10.0000 mg | Freq: Four times a day (QID) | INTRAMUSCULAR | Status: DC
  Administered 2012-11-14 – 2012-11-16 (×7): 10 mg via INTRAVENOUS
  Filled 2012-11-14 (×7): qty 2

## 2012-11-14 MED ORDER — PYRIDOXINE HCL 100 MG/ML IJ SOLN
30.0000 mg | Freq: Every day | INTRAMUSCULAR | Status: DC
  Administered 2012-11-15 – 2012-11-16 (×2): 30 mg via INTRAVENOUS
  Filled 2012-11-14 (×2): qty 0.3

## 2012-11-14 MED ORDER — THIAMINE HCL 100 MG/ML IJ SOLN
Freq: Once | INTRAVENOUS | Status: AC
  Administered 2012-11-14: 15:00:00 via INTRAVENOUS
  Filled 2012-11-14: qty 1000

## 2012-11-14 MED ORDER — SODIUM CHLORIDE 0.9 % IV SOLN: 1.6000 mg | Freq: Every day | INTRAVENOUS | Status: DC

## 2012-11-14 MED ORDER — IBUPROFEN 600 MG PO TABS: 600.0000 mg | ORAL_TABLET | Freq: Four times a day (QID) | ORAL | Status: DC | PRN

## 2012-11-14 MED ORDER — THIAMINE HCL 100 MG/ML IJ SOLN
Freq: Every day | INTRAVENOUS | Status: DC
  Administered 2012-11-15: 08:00:00 via INTRAVENOUS
  Filled 2012-11-14 (×2): qty 1000

## 2012-11-14 MED ORDER — OXYCODONE-ACETAMINOPHEN 5-325 MG PO TABS: 1.0000 | ORAL_TABLET | ORAL | Status: DC | PRN

## 2012-11-14 MED ORDER — M.V.I. ADULT IV INJ: INJECTION | Freq: Once | INTRAVENOUS | Status: DC

## 2012-11-14 MED ORDER — FOLIC ACID 5 MG/ML IJ SOLN: 0.6000 mg | Freq: Every day | INTRAMUSCULAR | Status: DC

## 2012-11-14 MED ORDER — THIAMINE HCL 100 MG/ML IJ SOLN: Freq: Once | INTRAVENOUS | Status: DC

## 2012-11-14 NOTE — Patient Instructions (Addendum)
CF Gene Mutation Testing This is a test used to detect cystic fibrosis (CF) genetic mutations to establish CF carrier status or to establish the diagnosis of CF in an individual. The CF gene mutation test identifies mutations in the CFTR gene on chromosome 7. Each cell in the human body (except sperm and eggs) has 46 chromosomes (23 inherited from the mother and 23 from the father). Genes on these chromosomes form the body's blueprint for producing proteins that control body functions. Cystic fibrosis is caused by a mutation in a pair of genes located on chromosomes 7. Both copies of this gene must be abnormal to cause CF. If only one copy of the gene pair is mutated, the patient will be a carrier. Carriers are not ill, they do not have any symptoms, but they can pass their abnormal CF gene copy on to their children.  When a newborn infant has meconium ileus (no stools in the first 24 to 48 hours of life) or when a person has symptoms of CF (salty sweat, persistent respiratory infections, wheezing, persistent diarrhea, foul-smelling greasy stools, malnutrition, and vitamin deficiency); if a person has a positive sweat chloride or IRT test or a close relative who has been diagnosed with CF; when a patient is undergoing genetic counseling and wants to find out if they are a CF carrier; or for prenatal diagnosis. PREPARATION FOR TEST A blood sample drawn from an infant's heel; a spot of blood that is put onto filter paper; or a blood sample is drawn from a vein in the arm. NORMAL FINDINGS No genetic mutation. Ranges for normal findings may vary among different laboratories and hospitals. You should always check with your doctor after having lab work or other tests done to discuss the meaning of your test results and whether your values are considered within normal limits. MEANING OF TEST Your caregiver will go over the test results with you and discuss the importance and meaning of your results, as well as  treatment options and the need for additional tests if necessary. OBTAINING THE TEST RESULTS It is your responsibility to obtain your test results. Ask the lab or department performing the test when and how you will get your results. Document Released: 06/02/2004 Document Revised: 08/01/2011 Document Reviewed: 04/16/2008 [employer] Patient Information 2014 [employer].  

## 2012-11-14 NOTE — Progress Notes (Signed)
Dehydration. Denies UTI symptoms. To [facility] for admission.

## 2012-11-14 NOTE — Progress Notes (Signed)
INITIAL NUTRITION ASSESSMENT  DOCUMENTATION CODES Per approved criteria  -Non-severe (moderate) malnutrition in the context of acute illness or injury   INTERVENTION: Bland diet, snacks TID Counseled pt on foods to include in diet  NUTRITION DIAGNOSIS: Inadequate oral intake  related to hyperemesis as evidenced by weight loss, n/v.   Goal: Tolerance of po diet/ weight gain  Monitor:  Diet tolerance  Reason for Assessment: MD consult for education and assessment of nutritional status  22 y.o. female  Admitting Dx: <principal problem not specified>  ASSESSMENT: Pt reports 6 pound weight loss over the past 2 weeks, overall a 11 Lb weight loss. Typically able to tolerate 1 meal per day. Meets criteria for mild to moderate degree of malnutrition based on degree of weight loss( 6% usual) and po intake that has been < 75 % of estimated needs for > 7 days  Height: Ht Readings from Last 1 Encounters:  11/14/12 5\' 5"  (1.651 m)    Weight: Wt Readings from Last 1 Encounters:  11/14/12 174 lb (78.926 kg)    Ideal Body Weight: 125 Lbs  % Ideal Body Weight: 139 %  Wt Readings from Last 10 Encounters:  11/14/12 174 lb (78.926 kg)  11/14/12 177 lb (80.287 kg)  11/01/12 180 lb (81.647 kg)  10/30/12 180 lb 3.2 oz (81.738 kg)  09/21/12 184 lb 12.8 oz (83.825 kg)  12/08/10 173 lb 9.6 oz (78.744 kg)  12/03/10 174 lb (78.926 kg)  12/03/10 174 lb (78.926 kg)    Usual Body Weight: 185 Lbs  % Usual Body Weight: 94%  BMI:  Body mass index is 28.96 kg/(m^2).  Estimated Nutritional Needs: Kcal: 17-1900 Protein: 70-80 g Fluid: 2 L   Diet Order: Parke Simmers, snacks TID  EDUCATION NEEDS: -Education needs addressed. Pt presented with copy of diet for morning sickness. Basic concepts reviewed and questions answered.  No intake or output data in the 24 hours ending 11/14/12 1356    Labs:  No results found for this basename: NA, K, CL, CO2, BUN, CREATININE, CALCIUM, MG, PHOS,  GLUCOSE,  in the last 168 hours  CBG (last 3)  No results found for this basename: GLUCAP,  in the last 72 hours  Scheduled Meds: . cefTRIAXone (ROCEPHIN)  IV  1 g Intravenous Once  . folic acid  600 mcg Intravenous Daily  . lactated ringers 1,000 mL with multivitamins adult (MVI -12) 10 mL infusion   Intravenous Once  . metoCLOPramide (REGLAN) injection  10 mg Intravenous Q6H  . pyridOXINE  30 mg Intravenous Daily    Continuous Infusions: . dextrose 5 % and 0.45 % NaCl with KCl 10 mEq/L      Past Medical History  Diagnosis Date  . Spontaneous abortion 12/03/2010  . Allergy   . Depression   . PONV (postoperative nausea and vomiting)   . [id number](811.9)     Past Surgical History  Procedure Laterality Date  . Dilation and evacuation  12/03/2010    Procedure: DILATATION AND EVACUATION (D&E);  Surgeon: Roseanna Rainbow, MD;  Location: [facility];  Service: Gynecology;  Laterality: N/A;  . Ovarian cyst removal    . Tonsillectomy      Elisabeth Cara M.Odis Luster LDN Neonatal Nutrition Support Specialist Pager [phone]

## 2012-11-14 NOTE — Progress Notes (Signed)
Pulse- 99 Pt states she is having daily nausea and vomiting. Pt is not able to keep much fluid or food down.  Pt is currently on Zofran and sates it only works sometimes.

## 2012-11-14 NOTE — H&P (Signed)
Carla Carla [name] is Carla [name] 22 y.o. female presenting with nausea/vomiting/dehydration.. Maternal Medical History:  Reason for admission: Nausea.  She presents with nausea and vomiting for several weeks.  Able to tolerate one meal/day; at times unable to keep liquids down.  The nausea/vomiting is refractory to Zofran.  There was 2+ ketonuria at an office visit today.    OB History   Grav Para Term Preterm Abortions TAB SAB Ect Mult Living   2    1  1         Past Medical History  Diagnosis Date  . Spontaneous abortion 12/03/2010  . Allergy   . Depression   . PONV (postoperative nausea and vomiting)   . AOZHYQMV(784.6)    Past Surgical History  Procedure Laterality Date  . Dilation and evacuation  12/03/2010    Procedure: DILATATION AND EVACUATION (D&E);  Surgeon: Carla Rainbow, MD;  Location: [facility];  Service: Gynecology;  Laterality: N/Carla [name];  . Ovarian cyst removal    . Tonsillectomy     Family History: family history includes Cancer in her other; Diabetes in her other; Heart failure in her other; Hypertension in her other; Seizures in her other; and Stroke in her other. Social History:  reports that she has never smoked. She does not have any smokeless tobacco history on file. She reports that  drinks alcohol. She reports that she does not use illicit drugs.     Review of Systems  Constitutional: Negative for fever.  Eyes: Negative for blurred vision.  Respiratory: Negative for shortness of breath.   Gastrointestinal: Positive for nausea and vomiting.  Skin: Negative for rash.  Neurological: Negative for headaches.  Psychiatric/Behavioral: Negative for depression.      Blood pressure 96/68, pulse 73, temperature 98.2 F (36.8 C), temperature source Oral, resp. rate 18, height 5\' 5"  (1.651 m), weight 174 lb (78.926 kg), last menstrual period 08/07/2012, SpO2 98.00%. Maternal Exam:  Abdomen: Patient reports no abdominal tenderness. Introitus: not evaluated.     Fetal  Exam Fetal Monitor Review: Mode: hand-held doppler probe.       Physical Exam  Constitutional: She appears well-developed.  HENT:  Head: Normocephalic.  Neck: Neck supple. No thyromegaly present.  Cardiovascular: Normal rate and regular rhythm.   Respiratory: Breath sounds normal.  GI: Soft. Bowel sounds are normal.  Skin: No rash noted.    Prenatal labs: ABO, Rh: O/POS/-- (06/12 1404) Antibody: NEG (06/12 1404) Rubella: 1.75 (06/12 1404) RPR: NON REAC (06/12 1404)  HBsAg: NEGATIVE (06/12 1404)  HIV: NON REACTIVE (06/12 1404)    Results for orders placed during the hospital encounter of 11/14/12 (from the past 24 hour(s))  COMPREHENSIVE METABOLIC PANEL     Status: Abnormal   Collection Time    11/14/12  2:05 PM      Result Value Range   Sodium 134 (*) 135 - 145 mEq/L   Potassium 4.1  3.5 - 5.1 mEq/L   Chloride 99  96 - 112 mEq/L   CO2 24  19 - 32 mEq/L   Glucose, Bld 84  70 - 99 mg/dL   BUN 6  6 - 23 mg/dL   Creatinine, Ser 9.62  0.50 - 1.10 mg/dL   Calcium 95.2  8.4 - 84.1 mg/dL   Total Protein 7.3  6.0 - 8.3 g/dL   Albumin 3.9  3.5 - 5.2 g/dL   AST 32  0 - 37 U/L   ALT 59 (*) 0 - 35 U/L   Alkaline Phosphatase 71  39 - 117 U/L   Total Bilirubin 0.7  0.3 - 1.2 mg/dL   GFR calc non Af Amer >90  >90 mL/min   GFR calc Af Amer >90  >90 mL/min  CBC     Status: None   Collection Time    11/14/12  2:05 PM      Result Value Range   WBC 9.2  4.0 - 10.5 K/uL   RBC 4.41  3.87 - 5.11 MIL/uL   Hemoglobin 13.9  12.0 - 15.0 g/dL   HCT 96.0  45.4 - 09.8 %   MCV 87.5  78.0 - 100.0 fL   MCH 31.5  26.0 - 34.0 pg   MCHC 36.0  30.0 - 36.0 g/dL   RDW 11.9  14.7 - 82.9 %   Platelets 234  150 - 400 K/uL  MAGNESIUM     Status: None   Collection Time    11/14/12  2:05 PM      Result Value Range   Magnesium 2.1  1.5 - 2.5 mg/dL  URINALYSIS, ROUTINE W REFLEX MICROSCOPIC     Status: Abnormal   Collection Time    11/14/12  2:50 PM      Result Value Range   Color, Urine YELLOW   YELLOW   APPearance CLEAR  CLEAR   Specific Gravity, Urine 1.015  1.005 - 1.030   pH 6.5  5.0 - 8.0   Glucose, UA NEGATIVE  NEGATIVE mg/dL   Hgb urine dipstick SMALL (*) NEGATIVE   Bilirubin Urine NEGATIVE  NEGATIVE   Ketones, ur 40 (*) NEGATIVE mg/dL   Protein, ur NEGATIVE  NEGATIVE mg/dL   Urobilinogen, UA 0.2  0.0 - 1.0 mg/dL   Nitrite NEGATIVE  NEGATIVE   Leukocytes, UA NEGATIVE  NEGATIVE  URINE MICROSCOPIC-ADD ON     Status: Abnormal   Collection Time    11/14/12  2:50 PM      Result Value Range   Squamous Epithelial / LPF FEW (*) RARE   WBC, UA 0-2  <3 WBC/hpf   RBC / HPF 3-6  <3 RBC/hpf   Bacteria, UA FEW (*) RARE   Urine-Other MUCOUS PRESENT     Assessment/Plan  Pregnancy-related N/V; IUP @ [redacted]w[redacted]d  ?UTI  Observation Check U/Carla [name], urine C&S; Ceftriaxone x 1 dose IV hydration/antiemetics Carla Carla [name],Carla Carla [name] 11/14/2012, 8:40 PM

## 2012-11-15 LAB — URINE CULTURE: Colony Count: NO GROWTH

## 2012-11-15 MED ORDER — KCL IN DEXTROSE-NACL 10-5-0.45 MEQ/L-%-% IV SOLN
INTRAVENOUS | Status: DC
  Filled 2012-11-15 (×2): qty 1000

## 2012-11-15 MED ORDER — KCL IN DEXTROSE-NACL 10-5-0.45 MEQ/L-%-% IV SOLN: INTRAVENOUS | Status: DC

## 2012-11-15 MED ORDER — POTASSIUM CHLORIDE 2 MEQ/ML IV SOLN
INTRAVENOUS | Status: DC
  Administered 2012-11-15 – 2012-11-16 (×2): via INTRAVENOUS
  Filled 2012-11-15 (×5): qty 1000

## 2012-11-15 NOTE — Progress Notes (Signed)
Patient ID: Carla [name], female   DOB: [birth year], 22 y.o.   MRN: [id number] Hospital Day: 2  S: Vomited last pm; tolerated breakfast O: Blood pressure 114/54, pulse 78, temperature 98.3 F (36.8 C), temperature source Oral, resp. rate 18, height 5\' 3"  (1.6 m), weight 180 lb (81.647 kg), last menstrual period 08/07/2012, SpO2 99.00%.   Abd: NT A/P- 22 y.o. admitted with:  Present on Admission:  . Hyperemesis gravidarum Symptoms improving  Continue fluids/vitamins/antiemetics Dating:  [redacted]w[redacted]d

## 2012-11-15 NOTE — Progress Notes (Signed)
UR completed 

## 2012-11-16 MED ORDER — METOCLOPRAMIDE HCL 5 MG PO TABS: 5.0000 mg | ORAL_TABLET | Freq: Four times a day (QID) | ORAL | Status: DC

## 2012-11-16 NOTE — Discharge Summary (Signed)
Physician Discharge Summary  Patient ID: Carla [name] MRN: [id number] DOB/AGE: 02/03/1991 22 y.o.  Admit date: 11/14/2012 Discharge date: 11/16/2012  Admission Diagnoses:  Hyperemesis gravidarum  Discharge Diagnoses:  Same Active Problems:   Hyperemesis gravidarum   Discharged Condition: good  Hospital Course: Admitted with N/V.  Responded well to antiemetic therapy.  Tolerated regular diet by HD#2  Consults: None  Significant Diagnostic Studies: labs: CBC, CMET  Treatments: IV hydration and Reglan  Discharge Exam: Blood pressure 113/65, pulse 75, temperature 98.2 F (36.8 C), temperature source Oral, resp. rate 18, height 5\' 3"  (1.6 m), weight 178 lb (80.74 kg), last menstrual period 08/07/2012, SpO2 98.00%. General appearance: alert and no distress GI: normal findings: soft, non-tender  Disposition: 01-Home or Self Care  Discharge Orders   Future Appointments Provider Department Dept Phone   11/21/2012 2:00 PM [facility] [place] [facility] [phone]   Drink 32 oz of water 1 hour prior to appt. time and hold bladder. DO NOT VOID- Bladder must be full at time of study.   11/21/2012 3:00 PM [facility] [phone]   Future Orders Complete By Expires     Discharge diet:  As directed     Discharge instructions  As directed     Comments:      Routine for hyperemesis        Medication List    TAKE these medications       acetaminophen 325 MG tablet  Commonly known as:  TYLENOL  Take 650 mg by mouth every 6 (six) hours as needed for pain (headache).     GINGER ROOT PO  Take by mouth.     metoCLOPramide 5 MG tablet  Commonly known as:  REGLAN  Take 1 tablet (5 mg total) by mouth 4 (four) times daily.     ondansetron 8 MG disintegrating tablet  Commonly known as:  ZOFRAN-ODT  Take 1 tablet (8 mg total) by mouth every 8 (eight) hours as needed for nausea.     OVER THE COUNTER MEDICATION  Take 1 tablet  by mouth 2 (two) times daily. Patient takes Vitamin D supplement     polyethylene glycol packet  Commonly known as:  MIRALAX / GLYCOLAX  Take 17 g by mouth 2 (two) times daily.     SE-NATAL 19 29-1 MG Tabs           Follow-up Information   Follow up with Antionette Char A, MD. Schedule an appointment as soon as possible for a visit in 1 week.   Contact information:   [address] [place] [address] [phone]       Signed: Brock Bad 11/16/2012, 10:01 AM

## 2012-11-16 NOTE — Progress Notes (Signed)
Patient ID: Carla [name], female   DOB: Sep 24, 1990, 24 y.o.   MRN: [id number] Hospital Day: 3  S: Feeling better.  No N/V x 24 hours.  Tolerating regular diet.  O: Blood pressure 113/65, pulse 75, temperature 98.2 F (36.8 C), temperature source Oral, resp. rate 18, height 5\' 3"  (1.6 m), weight 178 lb (80.74 kg), last menstrual period 08/07/2012, SpO2 98.00%.   FHT:150bpm Toco: None SVE:   A/P- 22 y.o. admitted with:  Present on Admission:  . Hyperemesis gravidarum  Pregnancy Complications: Hyperemesis  Preterm labor management: no treatment necessary Dating:  [redacted]w[redacted]d PNL Needed:  none FWB:  good PTL:  none

## 2012-11-16 NOTE — Progress Notes (Signed)
Pt discharged to home with mother and brother.  Condition stable.  Pt ambulated to car with RN.  No equipment for home ordered at discharge.

## 2012-11-17 ENCOUNTER — Encounter: Payer: Self-pay | Admitting: Obstetrics & Gynecology

## 2012-11-19 ENCOUNTER — Other Ambulatory Visit: Payer: Self-pay | Admitting: Obstetrics & Gynecology

## 2012-11-19 DIAGNOSIS — Z3682 Encounter for antenatal screening for nuchal translucency: Secondary | ICD-10-CM

## 2012-11-19 LAB — CYSTIC FIBROSIS DIAGNOSTIC STUDY

## 2012-11-21 ENCOUNTER — Encounter (HOSPITAL_COMMUNITY): Payer: Self-pay

## 2012-11-21 ENCOUNTER — Ambulatory Visit (HOSPITAL_COMMUNITY)
Admit: 2012-11-21 | Discharge: 2012-11-21 | Disposition: A | Discharge status: Home / Self Care | Payer: BC Managed Care – PPO | Attending: Obstetrics & Gynecology | Admitting: Obstetrics & Gynecology

## 2012-11-21 ENCOUNTER — Other Ambulatory Visit: Payer: Self-pay | Admitting: Obstetrics & Gynecology

## 2012-11-21 ENCOUNTER — Ambulatory Visit (HOSPITAL_COMMUNITY): Admit: 2012-11-21 | Payer: BC Managed Care – PPO

## 2012-11-21 DIAGNOSIS — O3510X0 Maternal care for (suspected) chromosomal abnormality in fetus, unspecified, not applicable or unspecified: Secondary | ICD-10-CM | POA: Insufficient documentation

## 2012-11-21 DIAGNOSIS — Z3689 Encounter for other specified antenatal screening: Secondary | ICD-10-CM | POA: Insufficient documentation

## 2012-11-21 DIAGNOSIS — O351XX0 Maternal care for (suspected) chromosomal abnormality in fetus, not applicable or unspecified: Secondary | ICD-10-CM | POA: Insufficient documentation

## 2012-11-21 DIAGNOSIS — Z3682 Encounter for antenatal screening for nuchal translucency: Secondary | ICD-10-CM

## 2012-11-21 NOTE — Progress Notes (Signed)
Carla [name] was seen for ultrasound appointment today.  Please see [facility] report for details.  

## 2012-11-29 ENCOUNTER — Encounter: Payer: BC Managed Care – PPO | Admitting: Obstetrics & Gynecology

## 2012-11-29 ENCOUNTER — Encounter: Payer: Self-pay | Admitting: Obstetrics & Gynecology

## 2012-11-29 ENCOUNTER — Ambulatory Visit (HOSPITAL_COMMUNITY)
Admission: RE | Admit: 2012-11-29 | Discharge: 2012-11-29 | Disposition: A | Discharge status: Home / Self Care | Payer: BC Managed Care – PPO | Source: Ambulatory Visit | Attending: Obstetrics & Gynecology | Admitting: Obstetrics & Gynecology

## 2012-11-29 ENCOUNTER — Other Ambulatory Visit: Payer: Self-pay | Admitting: Obstetrics & Gynecology

## 2012-11-29 ENCOUNTER — Ambulatory Visit (INDEPENDENT_AMBULATORY_CARE_PROVIDER_SITE_OTHER): Payer: BC Managed Care – PPO | Admitting: Obstetrics & Gynecology

## 2012-11-29 VITALS — BP 121/83 | Temp 96.8°F | Wt 178.0 lb

## 2012-11-29 DIAGNOSIS — O351XX0 Maternal care for (suspected) chromosomal abnormality in fetus, not applicable or unspecified: Secondary | ICD-10-CM | POA: Insufficient documentation

## 2012-11-29 DIAGNOSIS — Z3481 Encounter for supervision of other normal pregnancy, first trimester: Secondary | ICD-10-CM

## 2012-11-29 DIAGNOSIS — Z3682 Encounter for antenatal screening for nuchal translucency: Secondary | ICD-10-CM

## 2012-11-29 DIAGNOSIS — O3510X0 Maternal care for (suspected) chromosomal abnormality in fetus, unspecified, not applicable or unspecified: Secondary | ICD-10-CM | POA: Insufficient documentation

## 2012-11-29 DIAGNOSIS — Z3480 Encounter for supervision of other normal pregnancy, unspecified trimester: Secondary | ICD-10-CM

## 2012-11-29 DIAGNOSIS — Z3689 Encounter for other specified antenatal screening: Secondary | ICD-10-CM | POA: Insufficient documentation

## 2012-11-29 LAB — POCT URINALYSIS DIPSTICK
Glucose, UA: NEGATIVE
Leukocytes, UA: NEGATIVE
Nitrite, UA: NEGATIVE
Protein, UA: NEGATIVE
Urobilinogen, UA: NEGATIVE

## 2012-11-29 NOTE — Patient Instructions (Signed)
Pregnancy - Second Trimester The second trimester is the period between 13 to 27 weeks of your pregnancy. It is important to follow your doctor's instructions. HOME CARE   Do not smoke.  Do not drink alcohol or use drugs.  Only take medicine as told by your doctor.  Take prenatal vitamins as told. The vitamin should contain 1 milligram of folic acid.  Exercise.  Eat healthy foods. Eat regular, well-balanced meals.  You can have sex (intercourse) if there are no other problems with the pregnancy.  Do not use hot tubs, steam rooms, or saunas.  Wear a seat belt while driving.  Avoid raw meat, uncooked cheese, and litter boxes and soil used by cats.  Visit your dentist. Cleanings are okay. GET HELP RIGHT AWAY IF:   You have a temperature by mouth above 102 F (38.9 C), not controlled by medicine.  Fluid is coming from your vagina.  Blood is coming from your vagina. Light spotting is common, especially after sex (intercourse).  You have a bad smelling fluid (discharge) coming from the vagina. The fluid changes from clear to white.  You still feel sick to your stomach (nauseous).  You throw up (vomit) blood.  You lose or gain more than 2 pounds (0.9 kilograms) of weight in a week, or as suggested by your doctor.  Your face, hands, feet, or legs get puffy (swell).  You get exposed to German measles and have never had them.  You get exposed to fifth disease or chickenpox.  You have belly (abdominal) pain.  You have a bad headache that will not go away.  You have watery poop (diarrhea), pain when you pee (urinate), or have shortness of breath.  You start to have problems seeing (blurry or double vision).  You fall, are in a car accident, or have any kind of trauma.  There is mental or physical violence at home.  You have any concerns or worries during your pregnancy. MAKE SURE YOU:   Understand these instructions.  Will watch your condition.  Will get help  right away if you are not doing well or get worse. Document Released: 08/03/2009 Document Revised: 08/01/2011 Document Reviewed: 08/03/2009 [employer] Patient Information 2014 [employer].  

## 2012-11-29 NOTE — Progress Notes (Signed)
No N/V x 1 week.

## 2012-11-29 NOTE — Progress Notes (Signed)
Pulse-71 No complaints.

## 2012-12-05 ENCOUNTER — Encounter (HOSPITAL_COMMUNITY): Payer: Self-pay | Admitting: Obstetrics and Gynecology

## 2012-12-13 ENCOUNTER — Ambulatory Visit (HOSPITAL_COMMUNITY)
Admission: RE | Admit: 2012-12-13 | Discharge: 2012-12-13 | Disposition: A | Discharge status: Home / Self Care | Payer: BC Managed Care – PPO | Source: Ambulatory Visit | Attending: Obstetrics & Gynecology | Admitting: Obstetrics & Gynecology

## 2012-12-13 ENCOUNTER — Ambulatory Visit (INDEPENDENT_AMBULATORY_CARE_PROVIDER_SITE_OTHER): Payer: BC Managed Care – PPO | Admitting: Obstetrics & Gynecology

## 2012-12-13 VITALS — BP 119/78 | Temp 97.6°F | Wt 177.0 lb

## 2012-12-13 DIAGNOSIS — Z3480 Encounter for supervision of other normal pregnancy, unspecified trimester: Secondary | ICD-10-CM

## 2012-12-13 DIAGNOSIS — Z3482 Encounter for supervision of other normal pregnancy, second trimester: Secondary | ICD-10-CM

## 2012-12-13 DIAGNOSIS — O3510X0 Maternal care for (suspected) chromosomal abnormality in fetus, unspecified, not applicable or unspecified: Secondary | ICD-10-CM | POA: Insufficient documentation

## 2012-12-13 DIAGNOSIS — O289 Unspecified abnormal findings on antenatal screening of mother: Secondary | ICD-10-CM | POA: Insufficient documentation

## 2012-12-13 DIAGNOSIS — O351XX0 Maternal care for (suspected) chromosomal abnormality in fetus, not applicable or unspecified: Secondary | ICD-10-CM | POA: Insufficient documentation

## 2012-12-13 LAB — POCT URINALYSIS DIPSTICK
Leukocytes, UA: NEGATIVE
Protein, UA: NEGATIVE
Urobilinogen, UA: NEGATIVE
pH, UA: 5

## 2012-12-13 NOTE — Progress Notes (Signed)
Pulse-75 No complaints

## 2012-12-15 ENCOUNTER — Other Ambulatory Visit: Payer: Self-pay

## 2012-12-16 ENCOUNTER — Encounter: Payer: Self-pay | Admitting: Obstetrics & Gynecology

## 2012-12-16 NOTE — Patient Instructions (Signed)
Pregnancy - Second Trimester The second trimester is the period between 13 to 27 weeks of your pregnancy. It is important to follow your doctor's instructions. HOME CARE   Do not smoke.  Do not drink alcohol or use drugs.  Only take medicine as told by your doctor.  Take prenatal vitamins as told. The vitamin should contain 1 milligram of folic acid.  Exercise.  Eat healthy foods. Eat regular, well-balanced meals.  You can have sex (intercourse) if there are no other problems with the pregnancy.  Do not use hot tubs, steam rooms, or saunas.  Wear a seat belt while driving.  Avoid raw meat, uncooked cheese, and litter boxes and soil used by cats.  Visit your dentist. Cleanings are okay. GET HELP RIGHT AWAY IF:   You have a temperature by mouth above 102 F (38.9 C), not controlled by medicine.  Fluid is coming from your vagina.  Blood is coming from your vagina. Light spotting is common, especially after sex (intercourse).  You have a bad smelling fluid (discharge) coming from the vagina. The fluid changes from clear to white.  You still feel sick to your stomach (nauseous).  You throw up (vomit) blood.  You lose or gain more than 2 pounds (0.9 kilograms) of weight in a week, or as suggested by your doctor.  Your face, hands, feet, or legs get puffy (swell).  You get exposed to German measles and have never had them.  You get exposed to fifth disease or chickenpox.  You have belly (abdominal) pain.  You have a bad headache that will not go away.  You have watery poop (diarrhea), pain when you pee (urinate), or have shortness of breath.  You start to have problems seeing (blurry or double vision).  You fall, are in a car accident, or have any kind of trauma.  There is mental or physical violence at home.  You have any concerns or worries during your pregnancy. MAKE SURE YOU:   Understand these instructions.  Will watch your condition.  Will get help  right away if you are not doing well or get worse. Document Released: 08/03/2009 Document Revised: 08/01/2011 Document Reviewed: 08/03/2009 [employer] Patient Information 2014 [employer].  

## 2012-12-16 NOTE — Progress Notes (Signed)
Doing well 

## 2012-12-17 ENCOUNTER — Telehealth (HOSPITAL_COMMUNITY): Payer: Self-pay | Admitting: MS"

## 2012-12-17 NOTE — Telephone Encounter (Signed)
Left message asking patient to return call regarding her lab visit last week.   Clydie Braun Tiersa Dayley 12/17/2012 12:17 PM    Called Ms. [name] regarding results of serum integrated screen, which indicated an increased risk for fetal Down syndrome of 1 in 165 (0.6%). Discussed with Ms. Mcconaughey that this is less than 1%. However, this risk is higher than her baseline age related risk. Briefly discussed that Ms. Yandell has follow-up screening and testing options including detailed ultrasound at approximately 18 weeks, cell free fetal DNA testing, and amniocentesis. Offered Ms. Vezina genetic counseling to further review the screen result and additional available screening and testing options. We discussed that genetic counseling can be performed at any point including a day this week, or on the same date as her detailed ultrasound, if she would prefer. Ms. Oka elected to come tomorrow, 7/29, at 11:00 for genetic counseling only visit. She was encouraged to call back with additional questions or concerns.   Clydie Braun Euell Schiff 12/17/2012 12:36 PM

## 2012-12-18 ENCOUNTER — Other Ambulatory Visit: Payer: Self-pay

## 2012-12-18 ENCOUNTER — Ambulatory Visit (HOSPITAL_COMMUNITY)
Admission: RE | Admit: 2012-12-18 | Discharge: 2012-12-18 | Disposition: A | Discharge status: Home / Self Care | Payer: BC Managed Care – PPO | Source: Ambulatory Visit | Attending: Obstetrics | Admitting: Obstetrics

## 2012-12-18 DIAGNOSIS — O3510X0 Maternal care for (suspected) chromosomal abnormality in fetus, unspecified, not applicable or unspecified: Secondary | ICD-10-CM | POA: Insufficient documentation

## 2012-12-18 DIAGNOSIS — O351XX0 Maternal care for (suspected) chromosomal abnormality in fetus, not applicable or unspecified: Secondary | ICD-10-CM | POA: Insufficient documentation

## 2012-12-18 NOTE — Progress Notes (Signed)
Genetic Counseling  High-Risk Gestation Note  Appointment Date:  12/18/2012 Referred By: Antionette Char, MD Date of Birth:  05/29/1990 Partner:  Carla [name]    Pregnancy History: [id number] Estimated Date of Delivery: 06/03/13 Estimated Gestational Age: [redacted]w[redacted]d Attending: Particia Nearing, MD   [name] and her partner, [name], were seen for genetic counseling because of an increased risk for fetal Down syndrome based on serum integrated screening through [employer]. They were accompanied by the patient's mother to today's visit.   They were counseled regarding the serum integrated screen result and the associated 1 in 165 risk for fetal Down syndrome.  We reviewed chromosomes, nondisjunction, and the common features and variable prognosis of Down syndrome.  In addition, we reviewed the screen adjusted reduction in risks for trisomy 18 (1 in 4,000 to 1 in 10,000) and ONTDs (1 in 4,000).  We also discussed other explanations for a screen positive result including: differences in maternal metabolism, and normal variation. They understand that this screening is not diagnostic for Down syndrome but provides a risk assessment.  We reviewed available screening options including noninvasive prenatal screening (NIPS)/cell free fetal DNA (cffDNA) testing, and detailed ultrasound.  They were counseled that screening tests are used to modify a patient's a priori risk for aneuploidy, typically based on age. This estimate provides a pregnancy specific risk assessment. We reviewed the benefits and limitations of each option. Specifically, we discussed the conditions for which each test screens, the detection rates, and false positive rates of each. They were also counseled regarding diagnostic testing via amniocentesis. We reviewed the approximate 1 in 300-500 risk for complications for amniocentesis, including spontaneous pregnancy loss.   After consideration of all  the options, she elected to proceed with NIPS (Panorama) at the time of today's visit.  Those results will be available in approximately 8-10 days.  Ultrasound is currently scheduled with the patient's OB office. We discussed that pending results of NIPS, detailed ultrasound would be available in our office. Diagnostic testing (amniocentesis) was declined today.  They understand that screening tests cannot rule out all birth defects or genetic syndromes. The patient was advised of this limitation and states she still does not want additional testing at this time.   [name] was provided with written information regarding sickle cell anemia (SCA) including the carrier frequency and incidence in the Hispanic/African-American population, the availability of carrier testing and prenatal diagnosis if indicated.  In addition, we discussed that hemoglobinopathies are routinely screened for as part of the [place] newborn screening panel.  She previously had hemoglobin electrophoresis performed through her OB office, which indicated the presence of normal adult hemoglobin. Thus, she does not appear to have sickle cell trait or other hemoglobin variant.    We also discussed cystic fibrosis (CF) including the carrier frequency and incidence in the Caucasian and African American populations, the availability of carrier testing and prenatal diagnosis if indicated.  In addition, we discussed that CF is routinely screened for as part of the [place] newborn screening panel.  We discussed that CF carrier screening was also previously performed through her OB office and was negative for the conditions screened. Thus, her risk to be a CF carrier has been reduced.    Both family histories were reviewed and found to be contributory for a stillbirth for the father of the pregnancy's paternal grandmother. He reported that the stillbirth was one of 9 children that she had. No underlying reason was determined.  Additionally, Ms.  Jezewski reported that her paternal aunt had a stillbirth. This relative was reportedly young when she had the baby, and an underlying cause was not determined. We discussed that there can be multiple causes for stillbirth. The majority of cases would not have implications for extended relatives.  Without further information regarding the provided family history, an accurate genetic risk cannot be calculated. Further genetic counseling is warranted if more information is obtained.  [name] denied exposure to environmental toxins or chemical agents. She denied the use of alcohol, tobacco or street drugs. She denied significant viral illnesses during the course of her pregnancy. Her medical and surgical histories were noncontributory.   I counseled this couple for approximately 40 minutes regarding the above risks and available options.   Quinn Plowman, MS,  Certified Genetic Counselor 12/18/2012

## 2012-12-19 ENCOUNTER — Other Ambulatory Visit: Payer: Self-pay | Admitting: Obstetrics & Gynecology

## 2012-12-19 DIAGNOSIS — Z1389 Encounter for screening for other disorder: Secondary | ICD-10-CM

## 2012-12-19 DIAGNOSIS — Z363 Encounter for antenatal screening for malformations: Secondary | ICD-10-CM

## 2012-12-24 ENCOUNTER — Telehealth (HOSPITAL_COMMUNITY): Payer: Self-pay | Admitting: MS"

## 2012-12-24 NOTE — Telephone Encounter (Signed)
Patient called to see if results were back from noninvasive prenatal screening (Panorama) drawn 12/18/12. Discussed with patient that these results are still pending. However, this is within the typical turnaround time expected for the test. Results typically are available in one a half weeks after sample is submitted. Discussed with patient that result would likely be back by the end of this week, and that I will call her with these results.   Clydie Braun Sriyan Cutting 12/24/2012 11:28 AM

## 2012-12-28 ENCOUNTER — Telehealth (HOSPITAL_COMMUNITY): Payer: Self-pay | Admitting: MS"

## 2012-12-28 NOTE — Telephone Encounter (Signed)
Called patient to update her regarding status of NIPS result. Lab is currently still pending. Spoke with representative at [facility], the lab performing the test. The sample is still in the lab being processed, so per discussion with the representative, the result should be available to [place] on Monday. I will plan to call patient on Monday with update.   Clydie Braun Trevin Gartrell 12/28/2012 4:25 PM

## 2012-12-29 ENCOUNTER — Encounter: Payer: Self-pay | Admitting: Obstetrics & Gynecology

## 2012-12-29 DIAGNOSIS — O280 Abnormal hematological finding on antenatal screening of mother: Secondary | ICD-10-CM | POA: Insufficient documentation

## 2012-12-31 ENCOUNTER — Telehealth (HOSPITAL_COMMUNITY): Payer: Self-pay | Admitting: MS"

## 2012-12-31 NOTE — Telephone Encounter (Signed)
Called Carla [name] to discuss her cell free fetal DNA test results.  Carla [name] had Panorama testing through [facility].  Testing was offered because of screen positive Down syndrome risk from serum integrated screening.   The patient was identified by name and DOB.  We reviewed that these are within normal limits, showing a less than 1 in 10,000 risk for trisomies 21, 18 and 13, and monosomy X (Turner syndrome).  In addition, the risk for triploidy/vanishing twin and sex chromosome trisomies (47,XXX and 47,XXY) was also low risk.  We reviewed that this testing identifies > 99% of pregnancies with trisomy 64, trisomy 27, trisomy 3, sex chromosome trisomies (47,XXX and 47,XXY), and triploidy.  The detection rate for monosomy X is ~92%.  The false positive rate is <0.1% for all conditions. Testing was also consistent with female gender.  The patient did wish to know gender.  She understands that this testing does not identify all genetic conditions.  All questions were answered to her satisfaction, she was encouraged to call with additional questions or concerns.  Quinn Plowman, MS Certified Genetic Counselor 12/31/2012 9:07 AM

## 2013-01-02 ENCOUNTER — Ambulatory Visit (INDEPENDENT_AMBULATORY_CARE_PROVIDER_SITE_OTHER): Payer: BC Managed Care – PPO

## 2013-01-02 ENCOUNTER — Other Ambulatory Visit: Payer: Self-pay | Admitting: Obstetrics & Gynecology

## 2013-01-02 DIAGNOSIS — O3432 Maternal care for cervical incompetence, second trimester: Secondary | ICD-10-CM

## 2013-01-02 DIAGNOSIS — Z1389 Encounter for screening for other disorder: Secondary | ICD-10-CM

## 2013-01-02 DIAGNOSIS — O3430 Maternal care for cervical incompetence, unspecified trimester: Secondary | ICD-10-CM

## 2013-01-16 ENCOUNTER — Ambulatory Visit (INDEPENDENT_AMBULATORY_CARE_PROVIDER_SITE_OTHER): Payer: BC Managed Care – PPO | Admitting: Obstetrics & Gynecology

## 2013-01-16 VITALS — BP 115/83 | Temp 98.3°F | Wt 178.2 lb

## 2013-01-16 DIAGNOSIS — Z3482 Encounter for supervision of other normal pregnancy, second trimester: Secondary | ICD-10-CM

## 2013-01-16 DIAGNOSIS — Z3480 Encounter for supervision of other normal pregnancy, unspecified trimester: Secondary | ICD-10-CM

## 2013-01-16 NOTE — Progress Notes (Signed)
Pulse- 80 

## 2013-01-17 ENCOUNTER — Encounter: Payer: Self-pay | Admitting: Obstetrics & Gynecology

## 2013-01-17 LAB — POCT URINALYSIS DIPSTICK
Bilirubin, UA: NEGATIVE
Ketones, UA: NEGATIVE
Leukocytes, UA: NEGATIVE
Nitrite, UA: NEGATIVE
Protein, UA: NEGATIVE
pH, UA: 7

## 2013-02-06 ENCOUNTER — Ambulatory Visit (INDEPENDENT_AMBULATORY_CARE_PROVIDER_SITE_OTHER): Payer: BC Managed Care – PPO | Admitting: Obstetrics & Gynecology

## 2013-02-06 ENCOUNTER — Other Ambulatory Visit: Payer: BC Managed Care – PPO

## 2013-02-06 ENCOUNTER — Encounter: Payer: Self-pay | Admitting: Obstetrics & Gynecology

## 2013-02-06 VITALS — BP 120/79 | Temp 98.3°F | Wt 179.8 lb

## 2013-02-06 DIAGNOSIS — Z3402 Encounter for supervision of normal first pregnancy, second trimester: Secondary | ICD-10-CM

## 2013-02-06 DIAGNOSIS — Z3400 Encounter for supervision of normal first pregnancy, unspecified trimester: Secondary | ICD-10-CM

## 2013-02-06 DIAGNOSIS — Z23 Encounter for immunization: Secondary | ICD-10-CM

## 2013-02-06 LAB — POCT URINALYSIS DIPSTICK
Bilirubin, UA: NEGATIVE
Glucose, UA: NEGATIVE
Ketones, UA: NEGATIVE
Leukocytes, UA: NEGATIVE
Nitrite, UA: NEGATIVE

## 2013-02-06 NOTE — Progress Notes (Signed)
Pulse- 87 

## 2013-02-07 ENCOUNTER — Encounter: Payer: Self-pay | Admitting: Obstetrics & Gynecology

## 2013-02-07 LAB — CBC
HCT: 33.1 % — ABNORMAL LOW (ref 36.0–46.0)
Hemoglobin: 10.9 g/dL — ABNORMAL LOW (ref 12.0–15.0)
MCV: 92.5 fL (ref 78.0–100.0)
RDW: 14.5 % (ref 11.5–15.5)
WBC: 11.9 10*3/uL — ABNORMAL HIGH (ref 4.0–10.5)

## 2013-02-07 LAB — GLUCOSE TOLERANCE, 2 HOURS W/ 1HR: Glucose, 2 hour: 87 mg/dL (ref 70–139)

## 2013-02-07 LAB — RPR

## 2013-02-07 NOTE — Progress Notes (Signed)
Doing well.  Offered flu vaccine/TDAP.

## 2013-03-06 ENCOUNTER — Encounter: Payer: Self-pay | Admitting: Obstetrics & Gynecology

## 2013-03-06 ENCOUNTER — Ambulatory Visit (INDEPENDENT_AMBULATORY_CARE_PROVIDER_SITE_OTHER): Payer: BC Managed Care – PPO | Admitting: Obstetrics & Gynecology

## 2013-03-06 VITALS — BP 120/72 | Temp 97.4°F | Wt 181.0 lb

## 2013-03-06 DIAGNOSIS — Z3402 Encounter for supervision of normal first pregnancy, second trimester: Secondary | ICD-10-CM

## 2013-03-06 DIAGNOSIS — Z3400 Encounter for supervision of normal first pregnancy, unspecified trimester: Secondary | ICD-10-CM

## 2013-03-06 LAB — POCT URINALYSIS DIPSTICK
Bilirubin, UA: NEGATIVE
Glucose, UA: NEGATIVE
Leukocytes, UA: NEGATIVE
Nitrite, UA: NEGATIVE
Urobilinogen, UA: NEGATIVE

## 2013-03-06 NOTE — Progress Notes (Signed)
Pulse- 106 U/S for growth.  Referral-->nutritionist.

## 2013-03-06 NOTE — Patient Instructions (Signed)
Patient information: Nutrition before and during pregnancy (The Basics)View in SpanishWritten by the doctors and editors at [employer]  Will I need to change the way I eat when I am pregnant? - Probably. In fact, you will probably need to change the way you eat before you get pregnant. You will also need to start taking a multivitamin that has folic acid in it.  If you want to get pregnant, see your doctor or nurse before you start trying. He or she will explain how your diet needs to change and outline the steps you can take to have the healthiest pregnancy possible. Eating the right foods will help your baby's development. Your baby will need nutrients from these foods to form normally and grow.  Eating the wrong foods could harm your baby. For example, if you eat cheese made from unpasteurized milk or raw or undercooked meat, you could get an infection that could lead to a miscarriage. Likewise, if you take too much vitamin A (more than 10,000 international units a day) in a vitamin supplement, your baby could be born with birth defects.  Making healthy food choices is also important for your health as a mother. As your baby grows and changes inside you, it will take nutrients from your body. You will have to replace these nutrients to stay healthy and have all the energy you need.  Which foods should I eat? - The best diet for you and your baby will include lots of fresh fruits, vegetables, and whole grains, some low-fat dairy products, and a few sources of protein, such as meat, fish, eggs, or dried peas or beans. If you do not eat dairy foods, you will need to get calcium from other sources. If you are a vegetarian, speak to a nutritionist (food expert) about your food choices. Vegetarian diets can sometimes be missing nutrients that are important for a growing baby. Should I prepare food differently? - Maybe. You need to be extra careful about avoiding germs in your food. Getting an infection while you  are pregnant can cause serious problems.  Here's what you should do to avoid germs in your food: ?Wash your hands well with soap and water before you handle food.  ?Make sure to fully cook fish, chicken, beef, eggs, and other meats.  ?Rinse fresh fruits and vegetables under lots of running water before you eat them. ?When you are done preparing food, wash your hands and anything that touched raw meat or deli meats with hot soapy water. This includes countertops, cutting boards, and knives and spoons. To reduce the risk of germs in food, you should also avoid foods that can easily carry germs, including:  ?Raw sprouts (including alfalfa, clover, radish, and mung bean) ?Milk, cheese, or juice that has not been pasteurized (also called unpasteurized) Which foods should I avoid? - You should avoid certain types of fish and all forms of alcohol. You should also limit the amount of caffeine in your diet, and check with your doctor before taking herbal products.  ?Fish - You should not eat types of fish that could have a lot of mercury in them. These include shark, swordfish, king mackerel, and tilefish. Mercury is a metal that can keep the baby's brain from developing normally.  You can eat types of fish that do not have a lot of mercury, but not more than 2 times a week. The types of fish and other seafood that are safe to eat 1 or 2 times a week include   shrimp, canned light tuna, salmon, pollock, and catfish. Tuna steaks are also OK to eat, but you should have that only 1 time a week.   Check with your doctor or nurse about the safety of fish caught in local rivers and lakes.  ?Alcohol - You should avoid alcohol completely. Even small amounts of alcohol could harm a baby.  ?Caffeine - Limit the amount of caffeine in your diet by not drinking more than 1 or 2 cups of coffee each day. Tea and cola also have caffeine, but not as much as coffee. ?Herbal products - Check with your doctor or nurse before  using herbal products. Some herbal teas might not be safe. What are prenatal vitamins? - Prenatal vitamins are vitamin supplements that you take the month before and all through your pregnancy. These vitamins, which also contain minerals (iron, calcium), help make sure that your baby has all the building blocks he or she needs to form healthy organs. Prenatal vitamins help lower the risk of birth defects and other problems.  What should I look for in prenatal vitamins? - Choose a multivitamin that's labeled "prenatal" and that has at least 400 micrograms of folic acid. Folic acid is especially important in preventing certain birth defects. Show your doctor or nurse the vitamins you plan to take to make sure the doses are right for you and your baby. Too much of some vitamins can be harmful. How much weight should I gain? - That will depend on how much you weigh to begin with. Your doctor or nurse will tell you how much weight gain is right for you. In general, a woman who is a healthy weight should gain 25 to 35 pounds during her pregnancy. A woman who is overweight or obese should gain less weight. If you start to lose weight, for example, because you have severe morning sickness, call your doctor or nurse. What if I can't afford to eat well? - If you can't afford healthy food, ask your doctor or nurse for information about programs that can help you. In the [place], there is a government program called "WIC" that helps women and their families get the nutrition they need. Many states and towns also have local programs to help women who are pregnant or nursing.

## 2013-03-12 ENCOUNTER — Other Ambulatory Visit: Payer: Self-pay | Admitting: Obstetrics & Gynecology

## 2013-03-12 ENCOUNTER — Ambulatory Visit (INDEPENDENT_AMBULATORY_CARE_PROVIDER_SITE_OTHER): Payer: BC Managed Care – PPO

## 2013-03-12 ENCOUNTER — Encounter: Payer: Self-pay | Admitting: Obstetrics

## 2013-03-12 ENCOUNTER — Encounter: Payer: Self-pay | Admitting: Obstetrics & Gynecology

## 2013-03-12 DIAGNOSIS — O365931 Maternal care for other known or suspected poor fetal growth, third trimester, fetus 1: Secondary | ICD-10-CM

## 2013-03-12 DIAGNOSIS — O365990 Maternal care for other known or suspected poor fetal growth, unspecified trimester, not applicable or unspecified: Secondary | ICD-10-CM

## 2013-03-12 LAB — US OB DETAIL + 14 WK

## 2013-03-18 ENCOUNTER — Ambulatory Visit (INDEPENDENT_AMBULATORY_CARE_PROVIDER_SITE_OTHER): Payer: BC Managed Care – PPO | Admitting: Obstetrics & Gynecology

## 2013-03-18 ENCOUNTER — Encounter: Payer: Self-pay | Admitting: Obstetrics & Gynecology

## 2013-03-18 VITALS — BP 118/80 | Temp 97.7°F | Wt 178.4 lb

## 2013-03-18 DIAGNOSIS — N949 Unspecified condition associated with female genital organs and menstrual cycle: Secondary | ICD-10-CM

## 2013-03-18 DIAGNOSIS — R102 Pelvic and perineal pain unspecified side: Secondary | ICD-10-CM

## 2013-03-18 DIAGNOSIS — O479 False labor, unspecified: Secondary | ICD-10-CM

## 2013-03-18 DIAGNOSIS — O9989 Other specified diseases and conditions complicating pregnancy, childbirth and the puerperium: Secondary | ICD-10-CM

## 2013-03-18 DIAGNOSIS — Z3402 Encounter for supervision of normal first pregnancy, second trimester: Secondary | ICD-10-CM

## 2013-03-18 DIAGNOSIS — O26899 Other specified pregnancy related conditions, unspecified trimester: Secondary | ICD-10-CM

## 2013-03-18 DIAGNOSIS — Z3400 Encounter for supervision of normal first pregnancy, unspecified trimester: Secondary | ICD-10-CM

## 2013-03-18 LAB — POCT URINALYSIS DIPSTICK
Bilirubin, UA: NEGATIVE
Blood, UA: NEGATIVE
Glucose, UA: NEGATIVE
Nitrite, UA: NEGATIVE
Spec Grav, UA: 1.02

## 2013-03-18 NOTE — Patient Instructions (Signed)

## 2013-03-18 NOTE — Progress Notes (Signed)
Pulse- 83 Informal cx length U/S: cx 2.6 - 2.8; no funneling.

## 2013-03-19 ENCOUNTER — Encounter (HOSPITAL_COMMUNITY): Payer: Self-pay | Admitting: *Deleted

## 2013-03-19 ENCOUNTER — Inpatient Hospital Stay (HOSPITAL_COMMUNITY)
Admission: AD | Admit: 2013-03-19 | Discharge: 2013-03-19 | Disposition: A | Discharge status: Home / Self Care | Payer: BC Managed Care – PPO | Source: Ambulatory Visit | Attending: Obstetrics | Admitting: Obstetrics

## 2013-03-19 DIAGNOSIS — O4700 False labor before 37 completed weeks of gestation, unspecified trimester: Secondary | ICD-10-CM | POA: Insufficient documentation

## 2013-03-19 DIAGNOSIS — R109 Unspecified abdominal pain: Secondary | ICD-10-CM | POA: Insufficient documentation

## 2013-03-19 DIAGNOSIS — O479 False labor, unspecified: Secondary | ICD-10-CM

## 2013-03-19 DIAGNOSIS — O99891 Other specified diseases and conditions complicating pregnancy: Secondary | ICD-10-CM | POA: Insufficient documentation

## 2013-03-19 DIAGNOSIS — M545 Low back pain, unspecified: Secondary | ICD-10-CM | POA: Insufficient documentation

## 2013-03-19 DIAGNOSIS — O4703 False labor before 37 completed weeks of gestation, third trimester: Secondary | ICD-10-CM

## 2013-03-19 LAB — CBC
Platelets: 248 10*3/uL (ref 150–400)
RBC: 3.58 MIL/uL — ABNORMAL LOW (ref 3.87–5.11)
RDW: 13.4 % (ref 11.5–15.5)
WBC: 10.9 10*3/uL — ABNORMAL HIGH (ref 4.0–10.5)

## 2013-03-19 LAB — URINALYSIS, ROUTINE W REFLEX MICROSCOPIC
Glucose, UA: NEGATIVE mg/dL
Ketones, ur: 15 mg/dL — AB
Leukocytes, UA: NEGATIVE
Leukocytes, UA: NEGATIVE
Nitrite: NEGATIVE
Nitrite: NEGATIVE
Protein, ur: NEGATIVE mg/dL
Specific Gravity, Urine: 1.019 (ref 1.005–1.030)
Specific Gravity, Urine: 1.025 (ref 1.005–1.030)
Urobilinogen, UA: 0.2 mg/dL (ref 0.0–1.0)
pH: 6 (ref 5.0–8.0)
pH: 6 (ref 5.0–8.0)

## 2013-03-19 LAB — CULTURE, OB URINE: Organism ID, Bacteria: NO GROWTH

## 2013-03-19 MED ORDER — NIFEDIPINE 10 MG PO CAPS: 10.0000 mg | ORAL_CAPSULE | Freq: Four times a day (QID) | ORAL | Status: DC | PRN

## 2013-03-19 MED ORDER — LACTATED RINGERS IV SOLN
INTRAVENOUS | Status: DC
  Administered 2013-03-19: 16:00:00 via INTRAVENOUS

## 2013-03-19 MED ORDER — NIFEDIPINE 10 MG PO CAPS
20.0000 mg | ORAL_CAPSULE | Freq: Once | ORAL | Status: AC
  Administered 2013-03-19: 20 mg via ORAL
  Filled 2013-03-19: qty 2

## 2013-03-19 NOTE — Progress Notes (Signed)
Provided pt with a list of all available pediatricians in the [place] area.

## 2013-03-19 NOTE — MAU Note (Signed)
Sunday night, couldn't sleep had bad abd pain.  Saw dr yesterday everything was ok, was told if had pain again today, to come in.  Has been losing wt with preg, may be dehydrated and need fluids.  Today has been having abd and back pain.

## 2013-03-19 NOTE — MAU Provider Note (Signed)
History     CSN: [id number]  Arrival date and time: 03/19/13 1333   First Provider Initiated Contact with Patient 03/19/13 1454      No chief complaint on file.  HPI This is a 22 y.o. female at [redacted]w[redacted]d who presents with persistent lower abdominal pain and low back pain. Has had long term vomiting, but pain is new. No leaking or bleeding. Seen in office yesterday with c/o abd pain and was told she is all right.   RN Note: Sunday night, couldn't sleep had bad abd pain. Saw dr yesterday everything was ok, was told if had pain again today, to come in. Has been losing wt with preg, may be dehydrated and need fluids. Today has been having abd and back pain  OB History   Grav Para Term Preterm Abortions TAB SAB Ect Mult Living   2    1  1          Past Medical History  Diagnosis Date  . Spontaneous abortion 12/03/2010  . Allergy   . Depression   . PONV (postoperative nausea and vomiting)   . [id number](478.2)     Past Surgical History  Procedure Laterality Date  . Dilation and evacuation  12/03/2010    Procedure: DILATATION AND EVACUATION (D&E);  Surgeon: Roseanna Rainbow, MD;  Location: [facility];  Service: Gynecology;  Laterality: N/A;  . Ovarian cyst removal    . Tonsillectomy      Family History  Problem Relation Age of Onset  . Hypertension Other   . Diabetes Other   . Stroke Other   . Cancer Other   . Heart failure Other   . Seizures Other     History  Substance Use Topics  . Smoking status: Never Smoker   . Smokeless tobacco: Not on file  . Alcohol Use: Yes     Comment: occ    Allergies:  Allergies  Allergen Reactions  . Shellfish Allergy Anaphylaxis  . Latex Itching    Prescriptions prior to admission  Medication Sig Dispense Refill  . acetaminophen (TYLENOL) 500 MG tablet Take 1,000 mg by mouth every 6 (six) hours as needed for pain.      Marland Kitchen FOLIC ACID PO Take 1 tablet by mouth daily.      . metoCLOPramide (REGLAN) 5 MG tablet Take 1 tablet (5 mg  total) by mouth 4 (four) times daily.  60 tablet  5  . OVER THE COUNTER MEDICATION Take 1 tablet by mouth 2 (two) times daily. Patient takes Vitamin D supplement      . polyethylene glycol (MIRALAX / GLYCOLAX) packet Take 17 g by mouth 2 (two) times daily.  14 each  0  . Prenatal Vit-DSS-Fe Fum-FA (SE-NATAL 19) 29-1 MG TABS Take 1 tablet by mouth daily.         Review of Systems  Constitutional: Negative for fever, chills and malaise/fatigue.  Gastrointestinal: Positive for nausea, vomiting and abdominal pain. Negative for diarrhea and constipation.  Genitourinary: Negative for dysuria.  Musculoskeletal: Negative for myalgias.  Neurological: Negative for dizziness and headaches.   Physical Exam   Blood pressure 130/96, pulse 101, temperature 98.3 F (36.8 C), temperature source Oral, resp. rate 18, height 5' 3.5" (1.613 m), weight 80.74 kg (178 lb), last menstrual period 08/07/2012.  Physical Exam  Constitutional: She is oriented to person, place, and time. She appears well-developed. No distress.  HENT:  Head: Normocephalic.  Cardiovascular: Normal rate.   Respiratory: Effort normal.  GI: Soft.  Genitourinary: Vagina normal and uterus normal. No vaginal discharge found.  Cervix 1cm/40%/ballotable FHR reassuring Uterine irritability noted  Musculoskeletal: Normal range of motion.  Neurological: She is alert and oriented to person, place, and time.  Skin: Skin is warm and dry.  Psychiatric: She has a normal mood and affect.   Cervix was 0/40% yesterday in office  MAU Course  Procedures  MDM Discussed with Dr Clearance Coots. Will give IVF and Procardia  >>  UCs lessened after Procardia and fluids. Urinated twice. Back pain persists  Assessment and Plan  A:  [facility] at [redacted]w[redacted]d       Preterm uterine irritability      Small cervical change  P:  Discharge home       Rx Procardia for PRN use       PTL precautions       Has appt Monday  [facility] 03/19/2013, 4:18 PM

## 2013-03-25 ENCOUNTER — Ambulatory Visit: Payer: BC Managed Care – PPO | Admitting: *Deleted

## 2013-03-25 ENCOUNTER — Encounter: Payer: Self-pay | Admitting: Obstetrics & Gynecology

## 2013-03-25 ENCOUNTER — Ambulatory Visit (INDEPENDENT_AMBULATORY_CARE_PROVIDER_SITE_OTHER): Payer: BC Managed Care – PPO | Admitting: Obstetrics & Gynecology

## 2013-03-25 VITALS — BP 108/73 | Temp 98.0°F | Wt 179.0 lb

## 2013-03-25 DIAGNOSIS — Z3400 Encounter for supervision of normal first pregnancy, unspecified trimester: Secondary | ICD-10-CM

## 2013-03-25 DIAGNOSIS — Z3402 Encounter for supervision of normal first pregnancy, second trimester: Secondary | ICD-10-CM

## 2013-03-25 LAB — POCT URINALYSIS DIPSTICK
Blood, UA: NEGATIVE
Glucose, UA: NEGATIVE
Ketones, UA: NEGATIVE
Nitrite, UA: NEGATIVE
Spec Grav, UA: 1.02

## 2013-03-25 NOTE — Progress Notes (Signed)
P 101 Patient states she is still feeling the contractions as before. Patient states thet are about the same intensity.

## 2013-03-26 ENCOUNTER — Encounter: Payer: Self-pay | Admitting: Obstetrics & Gynecology

## 2013-04-01 ENCOUNTER — Encounter: Payer: Self-pay | Admitting: Obstetrics & Gynecology

## 2013-04-01 ENCOUNTER — Ambulatory Visit (INDEPENDENT_AMBULATORY_CARE_PROVIDER_SITE_OTHER): Payer: BC Managed Care – PPO | Admitting: Obstetrics & Gynecology

## 2013-04-01 VITALS — BP 111/73 | Temp 98.1°F | Wt 176.0 lb

## 2013-04-01 DIAGNOSIS — Z3400 Encounter for supervision of normal first pregnancy, unspecified trimester: Secondary | ICD-10-CM

## 2013-04-01 DIAGNOSIS — Z3403 Encounter for supervision of normal first pregnancy, third trimester: Secondary | ICD-10-CM

## 2013-04-01 LAB — POCT URINALYSIS DIPSTICK
Bilirubin, UA: NEGATIVE
Glucose, UA: NEGATIVE
Ketones, UA: NEGATIVE
Leukocytes, UA: NEGATIVE
Nitrite, UA: NEGATIVE
Spec Grav, UA: 1.025

## 2013-04-01 NOTE — Progress Notes (Signed)
HR - 97 Pt in office for routine OB visit, denies new concerns at this time.

## 2013-04-01 NOTE — Patient Instructions (Signed)
Third Trimester of Pregnancy  The third trimester is from week 29 through week 42, months 7 through 9. The third trimester is a time when the fetus is growing rapidly. At the end of the ninth month, the fetus is about 20 inches in length and weighs 6 10 pounds.   BODY CHANGES  Your body goes through many changes during pregnancy. The changes vary from woman to woman.    Your weight will continue to increase. You can expect to gain 25 35 pounds (11 16 kg) by the end of the pregnancy.   You may begin to get stretch marks on your hips, abdomen, and breasts.   You may urinate more often because the fetus is moving lower into your pelvis and pressing on your bladder.   You may develop or continue to have heartburn as a result of your pregnancy.   You may develop constipation because certain hormones are causing the muscles that push waste through your intestines to slow down.   You may develop hemorrhoids or swollen, bulging veins (varicose veins).   You may have pelvic pain because of the weight gain and pregnancy hormones relaxing your joints between the bones in your pelvis. Back aches may result from over exertion of the muscles supporting your posture.   Your breasts will continue to grow and be tender. A yellow discharge may leak from your breasts called colostrum.   Your belly button may stick out.   You may feel short of breath because of your expanding uterus.   You may notice the fetus "dropping," or moving lower in your abdomen.   You may have a bloody mucus discharge. This usually occurs a few days to a week before labor begins.   Your cervix becomes thin and soft (effaced) near your due date.  WHAT TO EXPECT AT YOUR PRENATAL EXAMS   You will have prenatal exams every 2 weeks until week 36. Then, you will have weekly prenatal exams. During a routine prenatal visit:   You will be weighed to make sure you and the fetus are growing normally.   Your blood pressure is taken.   Your abdomen will be  measured to track your baby's growth.   The fetal heartbeat will be listened to.   Any test results from the previous visit will be discussed.   You may have a cervical check near your due date to see if you have effaced.  At around 36 weeks, your caregiver will check your cervix. At the same time, your caregiver will also perform a test on the secretions of the vaginal tissue. This test is to determine if a type of bacteria, Group B streptococcus, is present. Your caregiver will explain this further.  Your caregiver may ask you:   What your birth plan is.   How you are feeling.   If you are feeling the baby move.   If you have had any abnormal symptoms, such as leaking fluid, bleeding, severe headaches, or abdominal cramping.   If you have any questions.  Other tests or screenings that may be performed during your third trimester include:   Blood tests that check for low iron levels (anemia).   Fetal testing to check the health, activity level, and growth of the fetus. Testing is done if you have certain medical conditions or if there are problems during the pregnancy.  FALSE LABOR  You may feel small, irregular contractions that eventually go away. These are called Braxton Hicks contractions, or   false labor. Contractions may last for hours, days, or even weeks before true labor sets in. If contractions come at regular intervals, intensify, or become painful, it is best to be seen by your caregiver.   SIGNS OF LABOR    Menstrual-like cramps.   Contractions that are 5 minutes apart or less.   Contractions that start on the top of the uterus and spread down to the lower abdomen and back.   A sense of increased pelvic pressure or back pain.   A watery or bloody mucus discharge that comes from the vagina.  If you have any of these signs before the 37th week of pregnancy, call your caregiver right away. You need to go to the hospital to get checked immediately.  HOME CARE INSTRUCTIONS    Avoid all  smoking, herbs, alcohol, and unprescribed drugs. These chemicals affect the formation and growth of the baby.   Follow your caregiver's instructions regarding medicine use. There are medicines that are either safe or unsafe to take during pregnancy.   Exercise only as directed by your caregiver. Experiencing uterine cramps is a good sign to stop exercising.   Continue to eat regular, healthy meals.   Wear a good support bra for breast tenderness.   Do not use hot tubs, steam rooms, or saunas.   Wear your seat belt at all times when driving.   Avoid raw meat, uncooked cheese, cat litter boxes, and soil used by cats. These carry germs that can cause birth defects in the baby.   Take your prenatal vitamins.   Try taking a stool softener (if your caregiver approves) if you develop constipation. Eat more high-fiber foods, such as fresh vegetables or fruit and whole grains. Drink plenty of fluids to keep your urine clear or pale yellow.   Take warm sitz baths to soothe any pain or discomfort caused by hemorrhoids. Use hemorrhoid cream if your caregiver approves.   If you develop varicose veins, wear support hose. Elevate your feet for 15 minutes, 3 4 times a day. Limit salt in your diet.   Avoid heavy lifting, wear low heal shoes, and practice good posture.   Rest a lot with your legs elevated if you have leg cramps or low back pain.   Visit your dentist if you have not gone during your pregnancy. Use a soft toothbrush to brush your teeth and be gentle when you floss.   A sexual relationship may be continued unless your caregiver directs you otherwise.   Do not travel far distances unless it is absolutely necessary and only with the approval of your caregiver.   Take prenatal classes to understand, practice, and ask questions about the labor and delivery.   Make a trial run to the hospital.   Pack your hospital bag.   Prepare the baby's nursery.   Continue to go to all your prenatal visits as directed  by your caregiver.  SEEK MEDICAL CARE IF:   You are unsure if you are in labor or if your water has broken.   You have dizziness.   You have mild pelvic cramps, pelvic pressure, or nagging pain in your abdominal area.   You have persistent nausea, vomiting, or diarrhea.   You have a bad smelling vaginal discharge.   You have pain with urination.  SEEK IMMEDIATE MEDICAL CARE IF:    You have a fever.   You are leaking fluid from your vagina.   You have spotting or bleeding from your vagina.     You have severe abdominal cramping or pain.   You have rapid weight loss or gain.   You have shortness of breath with chest pain.   You notice sudden or extreme swelling of your face, hands, ankles, feet, or legs.   You have not felt your baby move in over an hour.   You have severe headaches that do not go away with medicine.   You have vision changes.  Document Released: 05/03/2001 Document Revised: 01/09/2013 Document Reviewed: 07/10/2012  [employer] Patient Information 2014 [employer].

## 2013-04-15 ENCOUNTER — Encounter: Payer: BC Managed Care – PPO | Admitting: Obstetrics & Gynecology

## 2013-04-15 ENCOUNTER — Encounter: Payer: Self-pay | Admitting: Obstetrics & Gynecology

## 2013-04-15 ENCOUNTER — Ambulatory Visit (INDEPENDENT_AMBULATORY_CARE_PROVIDER_SITE_OTHER): Payer: BC Managed Care – PPO | Admitting: Obstetrics & Gynecology

## 2013-04-15 VITALS — BP 113/83 | Temp 97.8°F | Wt 180.0 lb

## 2013-04-15 DIAGNOSIS — Z3400 Encounter for supervision of normal first pregnancy, unspecified trimester: Secondary | ICD-10-CM

## 2013-04-15 DIAGNOSIS — Z3403 Encounter for supervision of normal first pregnancy, third trimester: Secondary | ICD-10-CM

## 2013-04-15 LAB — POCT URINALYSIS DIPSTICK
Bilirubin, UA: NEGATIVE
Glucose, UA: NEGATIVE
Nitrite, UA: NEGATIVE
pH, UA: 6

## 2013-04-15 NOTE — Progress Notes (Signed)
Pulse- 103  Pt states she is having some contractions off and on. Pt states she is currently on Procardia. Pt states she is having some days with contractions and some days without.

## 2013-04-29 ENCOUNTER — Ambulatory Visit (INDEPENDENT_AMBULATORY_CARE_PROVIDER_SITE_OTHER): Payer: BC Managed Care – PPO | Admitting: Obstetrics & Gynecology

## 2013-04-29 VITALS — BP 123/79 | Temp 97.6°F | Wt 181.0 lb

## 2013-04-29 DIAGNOSIS — Z3403 Encounter for supervision of normal first pregnancy, third trimester: Secondary | ICD-10-CM

## 2013-04-29 DIAGNOSIS — Z3400 Encounter for supervision of normal first pregnancy, unspecified trimester: Secondary | ICD-10-CM

## 2013-04-29 LAB — POCT URINALYSIS DIPSTICK
Blood, UA: NEGATIVE
Glucose, UA: NEGATIVE
Nitrite, UA: NEGATIVE
Urobilinogen, UA: NEGATIVE

## 2013-04-29 NOTE — Patient Instructions (Signed)
Patient information: Group B streptococcus and pregnancy (Beyond the Basics)  Authors Karen M Puopolo, MD, PhD Carol J Baker, MD Section Editors Charles J Lockwood, MD Daniel J Sexton, MD Deputy Editor Vanessa A Barss, MD Disclosures  All topics are updated as new evidence becomes available and our peer review process is complete.  Literature review current through: Feb 2014.  This topic last updated: Nov 21, 2011.  INTRODUCTION - Group B streptococcus (GBS) is a bacterium that can cause serious infections in pregnant women and newborn babies. GBS is one of many types of streptococcal bacteria, sometimes called "strep." This article discusses GBS, its effect on pregnant women and infants, and ways to prevent complications of GBS. More detailed information about GBS is available by subscription. (See "Group B streptococcal infection in pregnant women".) WHAT IS GROUP B STREP INFECTION? - GBS is commonly found in the digestive system and the vagina. In healthy adults, GBS is not harmful and does not cause problems. But in pregnant women and newborn infants, being infected with GBS can cause serious illness. Approximately one in three to four pregnant women in the [place] carries GBS in their gastrointestinal system and/or in their vagina. Carrying GBS is not the same as being infected. Carriers are not sick and do not need treatment during pregnancy. There is no treatment that can stop you from carrying GBS.  Pregnant women who are carriers of GBS infrequently become infected with GBS. GBS can cause urinary tract infections, infection of the amniotic fluid (bag of water), and infection of the uterus after delivery. GBS infections during pregnancy may lead to preterm labor.  Pregnant women who carry GBS can pass on the bacteria to their newborns, and some of those babies become infected with GBS. Newborns who are infected with GBS can develop pneumonia (lung infection), septicemia (blood infection), or  meningitis (infection of the lining of the brain and spinal cord). These complications can be prevented by giving intravenous antibiotics during labor to any woman who is at risk of GBS infection. You are at risk of GBS infection if: You have a urine culture during your current pregnancy showing GBS  You have a vaginal and rectal culture during your current pregnancy showing GBS  You had an infant infected with GBS in the past GROUP B STREP PREVENTION - Most doctors and nurses recommend a urine culture early in your pregnancy to be sure that you do not have a bladder infection without symptoms. If you urine culture shows GBS or other bacteria, you may be treated with an antibiotic. If you have symptoms of urinary infection, such as pain with urination, any time during your pregnancy, a urine culture is done. If GBS grows from the urine culture, it should be treated with an antibiotic, and you should also receive intravenous antibiotics during labor. Expert groups recommend that all pregnant women have a GBS culture at 35 to 37 weeks of pregnancy. The culture is done by swabbing the vagina and rectum. If your GBS culture is positive, you will be given an intravenous antibiotic during labor. If you have preterm labor, the culture is done then and an intravenous antibiotic is given until the baby is born or the labor is stopped by your health care provider. If you have a positive GBS culture and you have an allergy to penicillin, be sure your doctor and nurse are aware of this allergy and tell them what happened with the allergy. If you had only a rash or itching, this   is not a serious allergy, and you can receive a common drug related to the penicillin. If you had a serious allergy (for example, trouble breathing, swelling of your face) you may need an additional test to determine which antibiotic should be used during labor. Being treated with an antibiotic during labor greatly reduces the chance that you or  your newborn will develop infections related to GBS. It is important to note that young infants up to age 3 months can also develop septicemia, meningitis and other serious infections from GBS. Being treated with an antibiotic during labor does not reduce the chance that your baby will develop this later type of infection. There is currently no known way of preventing this later-onset GBS disease. WHERE TO GET MORE INFORMATION - Your healthcare provider is the best source of information for questions and concerns related to your medical problem.  

## 2013-04-29 NOTE — Progress Notes (Signed)
Pulse- 92 Pt states she is having pain in her sides. Pt states she is having occasional vaginal pressure.

## 2013-04-30 LAB — OB RESULTS CONSOLE GC/CHLAMYDIA
Chlamydia: NEGATIVE
GC PROBE AMP, GENITAL: NEGATIVE

## 2013-04-30 LAB — GC/CHLAMYDIA PROBE AMP: CT Probe RNA: NEGATIVE

## 2013-05-01 LAB — STREP B DNA PROBE: GBSP: NEGATIVE

## 2013-05-13 ENCOUNTER — Ambulatory Visit (INDEPENDENT_AMBULATORY_CARE_PROVIDER_SITE_OTHER): Payer: BC Managed Care – PPO | Admitting: Advanced Practice Midwife

## 2013-05-13 ENCOUNTER — Encounter: Payer: BC Managed Care – PPO | Admitting: Obstetrics & Gynecology

## 2013-05-13 VITALS — BP 116/77 | Temp 97.4°F | Wt 184.0 lb

## 2013-05-13 DIAGNOSIS — Z3400 Encounter for supervision of normal first pregnancy, unspecified trimester: Secondary | ICD-10-CM

## 2013-05-13 DIAGNOSIS — Z3403 Encounter for supervision of normal first pregnancy, third trimester: Secondary | ICD-10-CM

## 2013-05-13 LAB — POCT URINALYSIS DIPSTICK
Bilirubin, UA: NEGATIVE
Blood, UA: NEGATIVE
Glucose, UA: NEGATIVE
Leukocytes, UA: NEGATIVE
Protein, UA: NEGATIVE
Spec Grav, UA: <=1.005
Urobilinogen, UA: NEGATIVE
pH, UA: 6.5

## 2013-05-13 NOTE — Progress Notes (Signed)
Routine Obstetrical Visit  Subjective:    Carla [name] is being seen today for her routine obstetrical visit. She is at [redacted]w[redacted]d gestation.   Patient reports no complaints.  Here today with her mother Maryn.   Objective:     BP 116/77  Temp(Src) 97.4 F (36.3 C)  Wt 184 lb (83.462 kg)  LMP 08/07/2012 Physical Exam  Exam    Assessment:    Pregnancy: [id number] Patient Active Problem List   Diagnosis Date Noted  . Abnormal quad screen 12/29/2012  . Supervision of normal first pregnancy 11/14/2012  . Hyperemesis gravidarum 11/14/2012       Plan:     Prenatal vitamins. Problem list reviewed and updated.  Reviewed negative GBS Results. Discussed birth control, patient still deciding. Follow up in 1 weeks. 80% of 20 min visit spent on counseling and coordination of care.     Nevayah Faust 05/13/2013

## 2013-05-13 NOTE — Progress Notes (Signed)
Pulse- 76 Pt states she is having lower abdomen pressure. Pt states she has discontinued  The Procardia that was given for preterm labor.

## 2013-05-20 ENCOUNTER — Ambulatory Visit (INDEPENDENT_AMBULATORY_CARE_PROVIDER_SITE_OTHER): Payer: BC Managed Care – PPO | Admitting: Obstetrics & Gynecology

## 2013-05-20 ENCOUNTER — Encounter: Payer: BC Managed Care – PPO | Admitting: Obstetrics & Gynecology

## 2013-05-20 VITALS — BP 119/77 | Temp 98.2°F | Wt 188.0 lb

## 2013-05-20 DIAGNOSIS — Z3403 Encounter for supervision of normal first pregnancy, third trimester: Secondary | ICD-10-CM

## 2013-05-20 DIAGNOSIS — Z3400 Encounter for supervision of normal first pregnancy, unspecified trimester: Secondary | ICD-10-CM

## 2013-05-20 LAB — POCT URINALYSIS DIPSTICK
Glucose, UA: NEGATIVE
Ketones, UA: NEGATIVE
Spec Grav, UA: 1.01
Urobilinogen, UA: NEGATIVE
pH, UA: 6

## 2013-05-20 NOTE — Progress Notes (Signed)
HR - 78 Pt in office today for routine OB visit, reports low abd pressure, denies any contractions or cramping

## 2013-05-23 ENCOUNTER — Encounter (HOSPITAL_COMMUNITY): Payer: Self-pay | Admitting: *Deleted

## 2013-05-23 ENCOUNTER — Inpatient Hospital Stay (HOSPITAL_COMMUNITY)
Admission: AD | Admit: 2013-05-23 | Discharge: 2013-05-23 | Disposition: A | Discharge status: Home / Self Care | Payer: BC Managed Care – PPO | Source: Ambulatory Visit | Attending: Obstetrics & Gynecology | Admitting: Obstetrics & Gynecology

## 2013-05-23 DIAGNOSIS — O479 False labor, unspecified: Secondary | ICD-10-CM | POA: Insufficient documentation

## 2013-05-23 MED ORDER — ZOLPIDEM TARTRATE 5 MG PO TABS
5.0000 mg | ORAL_TABLET | Freq: Once | ORAL | Status: AC
  Administered 2013-05-23: 5 mg via ORAL
  Filled 2013-05-23: qty 1

## 2013-05-23 NOTE — MAU Note (Signed)
Patient presents to MAU with c/o contractions 5 minutes apart. Denies LOF or VB at this time. Reports good fetal movement.

## 2013-05-23 NOTE — Discharge Instructions (Signed)

## 2013-05-25 ENCOUNTER — Encounter (HOSPITAL_COMMUNITY): Payer: Self-pay | Admitting: Family

## 2013-05-25 ENCOUNTER — Encounter (HOSPITAL_COMMUNITY): Payer: BC Managed Care – PPO | Admitting: Anesthesiology

## 2013-05-25 ENCOUNTER — Inpatient Hospital Stay (HOSPITAL_COMMUNITY)
Admission: AD | Admit: 2013-05-25 | Discharge: 2013-05-27 | DRG: 775 | Disposition: A | Discharge status: Home / Self Care | Payer: BC Managed Care – PPO | Source: Ambulatory Visit | Attending: Obstetrics | Admitting: Obstetrics

## 2013-05-25 ENCOUNTER — Inpatient Hospital Stay (HOSPITAL_COMMUNITY): Payer: BC Managed Care – PPO | Admitting: Anesthesiology

## 2013-05-25 DIAGNOSIS — O280 Abnormal hematological finding on antenatal screening of mother: Secondary | ICD-10-CM

## 2013-05-25 DIAGNOSIS — Z3401 Encounter for supervision of normal first pregnancy, first trimester: Secondary | ICD-10-CM

## 2013-05-25 DIAGNOSIS — IMO0001 Reserved for inherently not codable concepts without codable children: Secondary | ICD-10-CM

## 2013-05-25 DIAGNOSIS — O210 Mild hyperemesis gravidarum: Secondary | ICD-10-CM

## 2013-05-25 LAB — CBC
HCT: 33.5 % — ABNORMAL LOW (ref 36.0–46.0)
HEMOGLOBIN: 11.3 g/dL — AB (ref 12.0–15.0)
MCH: 29.9 pg (ref 26.0–34.0)
MCHC: 33.7 g/dL (ref 30.0–36.0)
MCV: 88.6 fL (ref 78.0–100.0)
PLATELETS: 204 10*3/uL (ref 150–400)
RBC: 3.78 MIL/uL — ABNORMAL LOW (ref 3.87–5.11)
RDW: 15.3 % (ref 11.5–15.5)
WBC: 14.8 10*3/uL — ABNORMAL HIGH (ref 4.0–10.5)

## 2013-05-25 LAB — RPR: RPR: NONREACTIVE

## 2013-05-25 MED ORDER — DIPHENHYDRAMINE HCL 50 MG/ML IJ SOLN: 12.5000 mg | INTRAMUSCULAR | Status: DC | PRN

## 2013-05-25 MED ORDER — EPHEDRINE 5 MG/ML INJ
10.0000 mg | INTRAVENOUS | Status: DC | PRN
  Filled 2013-05-25: qty 2
  Filled 2013-05-25: qty 4

## 2013-05-25 MED ORDER — LACTATED RINGERS IV SOLN
500.0000 mL | INTRAVENOUS | Status: DC | PRN
  Administered 2013-05-25: 500 mL via INTRAVENOUS

## 2013-05-25 MED ORDER — CITRIC ACID-SODIUM CITRATE 334-500 MG/5ML PO SOLN: 30.0000 mL | ORAL | Status: DC | PRN

## 2013-05-25 MED ORDER — OXYTOCIN 40 UNITS IN LACTATED RINGERS INFUSION - SIMPLE MED
62.5000 mL/h | INTRAVENOUS | Status: DC
  Filled 2013-05-25: qty 1000

## 2013-05-25 MED ORDER — ACETAMINOPHEN 325 MG PO TABS
650.0000 mg | ORAL_TABLET | ORAL | Status: DC | PRN
  Administered 2013-05-25: 650 mg via ORAL
  Filled 2013-05-25: qty 2

## 2013-05-25 MED ORDER — PHENYLEPHRINE 40 MCG/ML (10ML) SYRINGE FOR IV PUSH (FOR BLOOD PRESSURE SUPPORT)
80.0000 ug | PREFILLED_SYRINGE | INTRAVENOUS | Status: DC | PRN
  Filled 2013-05-25: qty 2

## 2013-05-25 MED ORDER — FENTANYL 2.5 MCG/ML BUPIVACAINE 1/10 % EPIDURAL INFUSION (WH - ANES)
14.0000 mL/h | INTRAMUSCULAR | Status: DC | PRN
  Administered 2013-05-25: 14 mL/h via EPIDURAL
  Filled 2013-05-25: qty 125

## 2013-05-25 MED ORDER — BUTORPHANOL TARTRATE 1 MG/ML IJ SOLN
1.0000 mg | INTRAMUSCULAR | Status: DC | PRN
  Administered 2013-05-25 (×4): 1 mg via INTRAVENOUS
  Filled 2013-05-25 (×5): qty 1

## 2013-05-25 MED ORDER — EPHEDRINE 5 MG/ML INJ
10.0000 mg | INTRAVENOUS | Status: DC | PRN
  Filled 2013-05-25: qty 2

## 2013-05-25 MED ORDER — PHENYLEPHRINE 40 MCG/ML (10ML) SYRINGE FOR IV PUSH (FOR BLOOD PRESSURE SUPPORT)
80.0000 ug | PREFILLED_SYRINGE | INTRAVENOUS | Status: DC | PRN
  Filled 2013-05-25: qty 2
  Filled 2013-05-25: qty 10

## 2013-05-25 MED ORDER — FLEET ENEMA 7-19 GM/118ML RE ENEM: 1.0000 | ENEMA | RECTAL | Status: DC | PRN

## 2013-05-25 MED ORDER — ONDANSETRON HCL 4 MG/2ML IJ SOLN
4.0000 mg | Freq: Four times a day (QID) | INTRAMUSCULAR | Status: DC | PRN
  Administered 2013-05-25: 4 mg via INTRAVENOUS
  Filled 2013-05-25: qty 2

## 2013-05-25 MED ORDER — LIDOCAINE HCL (PF) 1 % IJ SOLN
30.0000 mL | INTRAMUSCULAR | Status: DC | PRN
  Filled 2013-05-25 (×2): qty 30

## 2013-05-25 MED ORDER — OXYCODONE-ACETAMINOPHEN 5-325 MG PO TABS
1.0000 | ORAL_TABLET | ORAL | Status: DC | PRN
  Administered 2013-05-26: 1 via ORAL

## 2013-05-25 MED ORDER — IBUPROFEN 600 MG PO TABS: 600.0000 mg | ORAL_TABLET | Freq: Four times a day (QID) | ORAL | Status: DC | PRN

## 2013-05-25 MED ORDER — LACTATED RINGERS IV SOLN
500.0000 mL | Freq: Once | INTRAVENOUS | Status: AC
  Administered 2013-05-25: 500 mL via INTRAVENOUS

## 2013-05-25 MED ORDER — SODIUM BICARBONATE 8.4 % IV SOLN
INTRAVENOUS | Status: DC | PRN
  Administered 2013-05-25: 5 mL via EPIDURAL

## 2013-05-25 MED ORDER — OXYTOCIN BOLUS FROM INFUSION
500.0000 mL | INTRAVENOUS | Status: DC
  Administered 2013-05-25: 500 mL via INTRAVENOUS

## 2013-05-25 MED ORDER — LACTATED RINGERS IV SOLN
INTRAVENOUS | Status: DC
  Administered 2013-05-25: 20:00:00 via INTRAVENOUS

## 2013-05-25 NOTE — Anesthesia Procedure Notes (Signed)

## 2013-05-25 NOTE — Anesthesia Preprocedure Evaluation (Addendum)

## 2013-05-25 NOTE — H&P (Signed)
This is Dr. Francoise CeoBernard Marshall dictating the history and physical on blank blank she's a 23 year old gravida 4 para 0030 at 3338 weeks and 5 days to 06/03/2013 negative GBS admitted with ruptured membranes 4 cm dilated 100% she was on Procardia Dillard during the pregnancy because of premature dilatation the cervix she progressed satisfactorily had meconium-stained fluid and and and normal basilar of a female Apgar 8 and 9 placenta spontaneous no lacerations Past medical history negative Past surgical history negative Social history negative System review negative Physical exam well-developed female post delivery HEENT negative Lungs clear to P&A Heart regular rhythm no murmurs no gallops Breasts negative Abdomen uterus 20 week postpartum size pelvic as described above extremities negative and and and

## 2013-05-26 ENCOUNTER — Encounter (HOSPITAL_COMMUNITY): Payer: Self-pay | Admitting: *Deleted

## 2013-05-26 LAB — CBC
HCT: 29.2 % — ABNORMAL LOW (ref 36.0–46.0)
Hemoglobin: 9.7 g/dL — ABNORMAL LOW (ref 12.0–15.0)
MCH: 29.7 pg (ref 26.0–34.0)
MCHC: 33.2 g/dL (ref 30.0–36.0)
MCV: 89.3 fL (ref 78.0–100.0)
Platelets: 201 10*3/uL (ref 150–400)
RBC: 3.27 MIL/uL — AB (ref 3.87–5.11)
RDW: 15.6 % — ABNORMAL HIGH (ref 11.5–15.5)
WBC: 20.2 10*3/uL — AB (ref 4.0–10.5)

## 2013-05-26 MED ORDER — DIPHENHYDRAMINE HCL 25 MG PO CAPS: 25.0000 mg | ORAL_CAPSULE | Freq: Four times a day (QID) | ORAL | Status: DC | PRN

## 2013-05-26 MED ORDER — SIMETHICONE 80 MG PO CHEW: 80.0000 mg | CHEWABLE_TABLET | ORAL | Status: DC | PRN

## 2013-05-26 MED ORDER — OXYCODONE-ACETAMINOPHEN 5-325 MG PO TABS
1.0000 | ORAL_TABLET | ORAL | Status: DC | PRN
  Administered 2013-05-26: 1 via ORAL
  Filled 2013-05-26 (×2): qty 1

## 2013-05-26 MED ORDER — SENNOSIDES-DOCUSATE SODIUM 8.6-50 MG PO TABS
2.0000 | ORAL_TABLET | ORAL | Status: DC
  Filled 2013-05-26 (×2): qty 2

## 2013-05-26 MED ORDER — DIBUCAINE 1 % RE OINT
1.0000 | TOPICAL_OINTMENT | RECTAL | Status: DC | PRN
Start: 2013-05-26 — End: 2013-05-27

## 2013-05-26 MED ORDER — BENZOCAINE-MENTHOL 20-0.5 % EX AERO
1.0000 "application " | INHALATION_SPRAY | CUTANEOUS | Status: DC | PRN
  Administered 2013-05-26: 1 via TOPICAL
  Filled 2013-05-26: qty 56

## 2013-05-26 MED ORDER — FERROUS SULFATE 325 (65 FE) MG PO TABS
325.0000 mg | ORAL_TABLET | Freq: Two times a day (BID) | ORAL | Status: DC
  Administered 2013-05-26 – 2013-05-27 (×3): 325 mg via ORAL
  Filled 2013-05-26 (×3): qty 1

## 2013-05-26 MED ORDER — ZOLPIDEM TARTRATE 5 MG PO TABS: 5.0000 mg | ORAL_TABLET | Freq: Every evening | ORAL | Status: DC | PRN

## 2013-05-26 MED ORDER — ONDANSETRON HCL 4 MG/2ML IJ SOLN: 4.0000 mg | INTRAMUSCULAR | Status: DC | PRN

## 2013-05-26 MED ORDER — LANOLIN HYDROUS EX OINT: TOPICAL_OINTMENT | CUTANEOUS | Status: DC | PRN

## 2013-05-26 MED ORDER — PRENATAL MULTIVITAMIN CH
1.0000 | ORAL_TABLET | Freq: Every day | ORAL | Status: DC
  Administered 2013-05-26: 1 via ORAL
  Filled 2013-05-26: qty 1

## 2013-05-26 MED ORDER — TETANUS-DIPHTH-ACELL PERTUSSIS 5-2.5-18.5 LF-MCG/0.5 IM SUSP: 0.5000 mL | Freq: Once | INTRAMUSCULAR | Status: DC

## 2013-05-26 MED ORDER — WITCH HAZEL-GLYCERIN EX PADS: 1.0000 "application " | MEDICATED_PAD | CUTANEOUS | Status: DC | PRN

## 2013-05-26 MED ORDER — IBUPROFEN 600 MG PO TABS
600.0000 mg | ORAL_TABLET | Freq: Four times a day (QID) | ORAL | Status: DC
  Administered 2013-05-26 – 2013-05-27 (×5): 600 mg via ORAL
  Filled 2013-05-26 (×6): qty 1

## 2013-05-26 MED ORDER — ONDANSETRON HCL 4 MG PO TABS: 4.0000 mg | ORAL_TABLET | ORAL | Status: DC | PRN

## 2013-05-26 NOTE — Lactation Note (Signed)
This note was copied from the chart of Girl Dorena CookeyCindy Demilio. Lactation Consultation Note Initial consult:  Baby 17 hours old and is sleeping skin to skin after her bath. First time mother states breastfeeding going well.  Reviewed breastfeeding basics, supply and demand, lactation support services and brochure.  Maternal grandmother in room with previous breastfeeding experience will be good support for the mother.  Demonstrated use of hand pump.  Asked mother to call with next feeding to view latch.   Patient Name: Girl Dorena CookeyCindy Deetz UJWJX'BToday's Date: 05/26/2013 Reason for consult: Initial assessment   Maternal Data    Feeding Feeding Type: Breast Fed Length of feed: 11 min  LATCH Score/Interventions Latch: Repeated attempts needed to sustain latch, nipple held in mouth throughout feeding, stimulation needed to elicit sucking reflex. Intervention(s): Adjust position;Assist with latch  Audible Swallowing: A few with stimulation Intervention(s): Skin to skin Intervention(s): Skin to skin  Type of Nipple: Everted at rest and after stimulation  Comfort (Breast/Nipple): Soft / non-tender     Hold (Positioning): Assistance needed to correctly position infant at breast and maintain latch.  LATCH Score: 7  Lactation Tools Discussed/Used Date initiated:: 05/26/13   Consult Status Consult Status: Follow-up    Dahlia ByesBerkelhammer, Ruth [facility] 05/26/2013, 3:07 PM

## 2013-05-26 NOTE — Progress Notes (Signed)
Patient ID: [name], female   DOB: [birth year], 23 y.o.   MRN: [id number] The patient had a normal vaginal delivery Apgar 8 and 9 him no episiotomy or laceration the cord pH was not done and those sutures used

## 2013-05-26 NOTE — Progress Notes (Signed)
Patient ID: Carla IhaCindy L [name], female   DOB: January 19, 1991, 23 y.o.   MRN: [id number] Postpartum day one Vital signs normal Fundus firm Lochia moderate Legs negative doing well

## 2013-05-26 NOTE — Anesthesia Postprocedure Evaluation (Signed)
Anesthesia Post Note  Patient: Carla [name]  Procedure(s) Performed: * No procedures listed *  Anesthesia type: Epidural  Patient location: Mother/Baby  Post pain: Pain level controlled  Post assessment: Post-op Vital signs reviewed  Last Vitals:  Filed Vitals:   05/26/13 1053  BP:   Pulse:   Temp: 36.7 C  Resp:     Post vital signs: Reviewed  Level of consciousness:alert  Complications: No apparent anesthesia complications

## 2013-05-27 ENCOUNTER — Encounter: Payer: BC Managed Care – PPO | Admitting: Obstetrics & Gynecology

## 2013-05-27 MED ORDER — IBUPROFEN 600 MG PO TABS: 600.0000 mg | ORAL_TABLET | Freq: Four times a day (QID) | ORAL | Status: DC | PRN

## 2013-05-27 MED ORDER — FERROUS SULFATE 325 (65 FE) MG PO TABS: 325.0000 mg | ORAL_TABLET | Freq: Two times a day (BID) | ORAL | Status: DC

## 2013-05-27 NOTE — Lactation Note (Signed)
This note was copied from the chart of Carla Dorena CookeyCindy Rohrman. Lactation Consultation Note  Patient Name: Carla [name] HYQMV'HToday's Date: 05/27/2013   Per mom breast feeding is going well both breast , denies soreness, breast are filling.  Baby already latched with depth , per mom comfortable. When baby released nipple appeared normal shape. Noted multiply swallows, increased with breast compressions. Reviewed basics,. Hand expression, prevention sore nipples and' Engorgement and tx , referring to the baby and me booklet pg 20 -25. Mom aware of the [facility] and the [facility].   Maternal Data    Feeding Feeding Type: Breast Fed Length of feed: 15 min (LC observed the baby already latched )  LATCH Score/Interventions Latch: Repeated attempts needed to sustain latch, nipple held in mouth throughout feeding, stimulation needed to elicit sucking reflex. Intervention(s): Adjust position  Audible Swallowing: A few with stimulation Intervention(s): Hand expression  Type of Nipple: Everted at rest and after stimulation  Comfort (Breast/Nipple): Soft / non-tender     Hold (Positioning): No assistance needed to correctly position infant at breast.  LATCH Score: 8  Lactation Tools Discussed/Used     Consult Status      Kathrin Greathouseorio, Jules Vidovich Ann 05/27/2013, 1:41 PM

## 2013-05-27 NOTE — Discharge Instructions (Signed)
° °  Iron-Rich Diet ° °An iron-rich diet contains foods that are good sources of iron. Iron is an important mineral that helps your body produce hemoglobin. Hemoglobin is a protein in red blood cells that carries oxygen to the body's tissues. Sometimes, the iron level in your blood can be low. This may be caused by: °· A lack of iron in your diet. °· Blood loss. °· Times of growth, such as during pregnancy or during a child's growth and development. °Low levels of iron can cause a decrease in the number of red blood cells. This can result in iron deficiency anemia. Iron deficiency anemia symptoms include: °· Tiredness. °· Weakness. °· Irritability. °· Increased chance of infection. °Here are some recommendations for daily iron intake: °· Males older than 25 years of age need 8 mg of iron per day. °· Women ages 19 to 50 need 18 mg of iron per day. °· Pregnant women need 27 mg of iron per day, and women who are over 19 years of age and breastfeeding need 9 mg of iron per day. °· Women over the age of 50 need 8 mg of iron per day. °SOURCES OF IRON °There are 2 types of iron that are found in food: heme iron and nonheme iron. Heme iron is absorbed by the body better than nonheme iron. Heme iron is found in meat, poultry, and fish. Nonheme iron is found in grains, beans, and vegetables. °Heme Iron Sources °Food / Iron (mg) °· Chicken liver, 3 oz (85 g)/ 10 mg °· Beef liver, 3 oz (85 g)/ 5.5 mg °· Oysters, 3 oz (85 g)/ 8 mg °· Beef, 3 oz (85 g)/ 2 to 3 mg °· Shrimp, 3 oz (85 g)/ 2.8 mg °· Turkey, 3 oz (85 g)/ 2 mg °· Chicken, 3 oz (85 g) / 1 mg °· Fish (tuna, halibut), 3 oz (85 g)/ 1 mg °· Pork, 3 oz (85 g)/ 0.9 mg °Nonheme Iron Sources °Food / Iron (mg) °· Ready-to-eat breakfast cereal, iron-fortified / 3.9 to 7 mg °· Tofu, ½ cup / 3.4 mg °· Kidney beans, ½ cup / 2.6 mg °· Baked potato with skin / 2.7 mg °· Asparagus, ½ cup / 2.2 mg °· Avocado / 2 mg °· Dried peaches, ½ cup / 1.6 mg °· Raisins, ½ cup / 1.5 mg °· Soy milk,  1 cup / 1.5 mg °· Whole-wheat bread, 1 slice / 1.2 mg °· Spinach, 1 cup / 0.8 mg °· Broccoli, ½ cup / 0.6 mg °IRON ABSORPTION °Certain foods can decrease the body's absorption of iron. Try to avoid these foods and beverages while eating meals with iron-containing foods: °· Coffee. °· Tea. °· Fiber. °· Soy. °Foods containing vitamin C can help increase the amount of iron your body absorbs from iron sources, especially from nonheme sources. Eat foods with vitamin C along with iron-containing foods to increase your iron absorption. Foods that are high in vitamin C include many fruits and vegetables. Some good sources are: °· Fresh orange juice. °· Oranges. °· Strawberries. °· Mangoes. °· Grapefruit. °· Red bell peppers. °· Green bell peppers. °· Broccoli. °· Potatoes with skin. °· Tomato juice. °Document Released: 12/21/2004 Document Revised: 08/01/2011 Document Reviewed: 10/28/2010 °ExitCare® Patient Information ©2014 [employer]. ° °

## 2013-05-27 NOTE — Discharge Summary (Signed)
Obstetric Discharge Summary Reason for Admission: onset of labor Prenatal Procedures: none Intrapartum Procedures: spontaneous vaginal delivery Postpartum Procedures: none Complications-Operative and Postpartum: none Hemoglobin  Date Value Range Status  05/26/2013 9.7* 12.0 - 15.0 g/dL Final     HCT  Date Value Range Status  05/26/2013 29.2* 36.0 - 46.0 % Final    Physical Exam:  General: alert and cooperative Lochia: appropriate Uterine Fundus: firm Incision: healing well DVT Evaluation: No evidence of DVT seen on physical exam.  Discharge Diagnoses: Term Pregnancy-delivered  Discharge Information: Date: 05/27/2013 Activity: unrestricted Diet: routine Medications: PNV, Ibuprofen and Iron Condition: stable Instructions: refer to practice specific booklet Discharge to: home   Newborn Data: Live born female  Birth Weight: 6 lb 12.8 oz (3085 g) APGAR: 9, 9  Home with mother.  WREN, AMY 05/27/2013, 8:38 AM

## 2013-06-11 ENCOUNTER — Ambulatory Visit: Payer: BC Managed Care – PPO | Admitting: Obstetrics & Gynecology

## 2013-06-12 ENCOUNTER — Ambulatory Visit: Payer: BC Managed Care – PPO | Admitting: Obstetrics & Gynecology

## 2013-06-13 ENCOUNTER — Ambulatory Visit: Payer: BC Managed Care – PPO | Admitting: Obstetrics & Gynecology

## 2013-06-19 ENCOUNTER — Encounter: Payer: Self-pay | Admitting: Obstetrics & Gynecology

## 2013-06-19 ENCOUNTER — Ambulatory Visit (INDEPENDENT_AMBULATORY_CARE_PROVIDER_SITE_OTHER): Payer: BC Managed Care – PPO | Admitting: Obstetrics & Gynecology

## 2013-06-19 MED ORDER — NORETHINDRONE 0.35 MG PO TABS: 1.0000 | ORAL_TABLET | Freq: Every day | ORAL | Status: DC

## 2013-06-19 NOTE — Progress Notes (Signed)
Subjective:     Carla [name] is a 23 y.o. female who presents for a postpartum visit. She is 3 weeks postpartum following a spontaneous vaginal delivery. I have fully reviewed the prenatal and intrapartum course. The delivery was at 38 gestational weeks. Outcome: spontaneous vaginal delivery. Anesthesia: epidural. Postpartum course has been WNL. Baby's course has been WNL. Baby is feeding by breast. Bleeding thin lochia that only occurs after pumping. Bowel function is normal. Bladder function is normal. Patient is not sexually active. Contraception method is abstinence. Postpartum depression screening: negative.  The following portions of the patient's history were reviewed and updated as appropriate: allergies, current medications, past family history, past medical history, past social history, past surgical history and problem list.  Review of Systems Pertinent items are noted in HPI.   Objective:    BP 124/85  Pulse 64  Temp(Src) 97.3 F (36.3 C)  Ht 5\' 4"  (1.626 m)  Wt 168 lb (76.204 kg)  BMI 28.82 kg/m2  Breastfeeding? Yes       Exam deferred Assessment:    Doing well  Plan:    Contraception: oral progesterone-only contraceptive Follow up in: 1 month or as needed.

## 2013-06-19 NOTE — Patient Instructions (Signed)
Sudden Infant Death Syndrome (SIDS): Sleeping Position SIDS is the sudden death of a healthy infant. The cause of SIDS is not known. However, there are certain factors that put the baby at risk, such as:  Babies placed on their stomach or side to sleep.  The baby being born earlier than normal (prematurity).  Being of [place] American, Native [place], and [place] Native descent.  Being a female. SIDS is seen more often in female babies than in female babies.  Sleeping on a soft surface.  Overheating.  Having a mother who smokes or uses illegal drugs.  Being an infant of a mother who is very young.  Having poor prenatal care.  Babies that had a low weight at birth.  Abnormalities of the placenta, the organ that provides nutrition in the womb.  Babies born in the fall or winter months.  Recent respiratory tract infection. Although it is recommended that most babies should be put on their backs to sleep, some questions have arisen: IS THE SIDE POSITION AS EFFECTIVE AS THE BACK? The side position is not recommended because there is still an increased chance of SIDS compared to the back position. Your baby should be placed on his or her back every time he or she sleeps. ARE THERE ANY BABIES WHO SHOULD BE PLACED ON THEIR TUMMY FOR SLEEP? Babies with certain disorders have fewer problems when lying on their tummy. These babies include:  Infants with symptomatic gastro-esophageal reflux (GERD). Reflux is usually less in the tummy position.  Babies with certain upper airway malformations, such as Robin syndrome. There are fewer occurrences of the airway being blocked when lying on the stomach. Before letting your baby sleep on his/her tummy, discuss with your caregiver. If your baby has one of the above problems, your caregiver will help you decide if the benefits of tummy sleeping are more than the small increased risk for SIDS. Be sure to avoid overheating and soft bedding as these risk  factors are troublesome for belly sleeping infants. SHOULD HEALTHY BABIES EVER BE PLACED ON THE TUMMY? Having tummy time while the baby is awake is important for movement (motor) development. It can also lower the chance of a flattened head (positional plagiocephaly). Flattened head can be the result of spending too much time on their back. Tummy time when the baby is awake and watched by an adult is good for baby's development. WHICH SLEEPING POSITION IS BEST FOR A BABY BORN EARLY (PRE-TERM) AFTER LEAVING THE HOSPITAL? In the nursery, babies who are born early (pre-term) often receive care in a position lying on their backs. Once recovered and ready to leave the hospital, there is no reason to believe that they should be treated any differently than a baby who was born at term. Unless there are specific instructions to do otherwise, these babies should be placed on their backs to sleep. IN WHAT POSITION ARE FULL-TERM BABIES PUT TO SLEEP IN HOSPITAL NURSERIES? Unless there is a specific reason to do otherwise, babies are placed on their backs in hospital nurseries.  IF A BABY DOES NOT SLEEP WELL ON HIS OR HER BACK, IS IT OKAY TO TURN HIM OR HER TO A SIDE OR TUMMY POSITION? No. Because of the risk of SIDS, the side and tummy positions are not recommended. Positional preference appears to be a learned behavior among infants from birth to 634 to 316 months of age. Infants who are always placed on their backs will become used to this position. If your baby  is not sleeping well, look for possible reasons. For example, be sure to avoid overheating or the use of soft bedding. AT WHAT AGE CAN YOU STOP USING THE BACK POSITION FOR SLEEP? The peak risk for SIDS is age 25 to 8224 weeks. Although less common, it can occur up to 25 year of age. It is recommended that you place your baby on his/her back up to age 17 year.  DO I NEED TO KEEP CHECKING ON MY BABY AFTER LAYING HIM OR HER DOWN FOR SLEEP IN A BACK-LYING POSITION?    No. Very young infants placed on their backs cannot roll onto their tummies. HOW SHOULD HOSPITALS PLACE BABIES DOWN FOR SLEEP IF THEY ARE READMITTED? As a general guideline, hospitalized infants should sleep on their backs just as they would at home. However, there may be a medical problem that would require a side or tummy position.  WILL BABIES ASPIRATE ON THEIR BACKS? There is no evidence that healthy babies are more likely to inhale stomach contents (have aspiration episodes) when they are on their backs. In the majority of the small number of reported cases of death due to aspiration, the infant's position at death, when known, was on their tummy. DOES SLEEPING ON THE BACK CAUSE BABIES TO HAVE FLAT HEADS? There is some suggestion that the incidence of babies developing a flat spot on their heads may have increased since the incidence of sleeping on their tummies has decreased. Usually, this is not a serious condition. This condition will disappear within several months after the baby begins to sit up. Flat spots can be avoided by altering the head position when the baby is sleeping on his/her back. Giving your baby tummy time also helps prevent the development of a flat head. SHOULD PRODUCTS BE USED TO KEEP BABIES ON THEIR BACKS OR SIDES DURING SLEEP? Although various devices have been sold to maintain babies in a back-lying position during sleep, their use is not recommended. Infants who sleep on their backs need no extra support. SHOULD SOFT SURFACES BE AVOIDED? Several studies indicate that soft sleeping surfaces increase the risk of SIDS in infants. It is unknown how soft a surface must be to pose a threat. A firm infant-mattress with no more than a thin covering such as a sheet or rubberized pad between the infant and mattress is advised. Soft, plush, or bulky items, such as pillows, rolls of bedding, or cushions in the baby's sleeping environment are strongly warned against. These items can  come into close contact with the infant's face and might cause breathing problems.  DOES BED SHARING OR CO-SLEEPING DECREEASE RISK? No.Bed sharing, while controversial, is associated with an increased risk of SIDS, especially when the mother smokes, when sleeping occurs on a couch or sofa, when there are multiple bed sharers, or when bed sharers have consumed alcohol. Sleeping in an approved crib or bassinet in the same room as the mother decreases risk of SIDS. CAN A PACIFIER DECREASE RISK? While it is not known exactly how, pacifier use during the first year of life decreases the risk of SIDS. Give your baby the pacifier when putting the baby down, but do not force a pacifier or place one in your baby's mouth once your baby has fallen asleep. Pacifiers should not have any sugary solutions applied to them and need to be cleaned regularly. Finally, if your baby is breastfeeding, it is beneficial to delay use of a pacifier in order to firmly establish breastfeeding. Document Released: 05/03/2001  Document Revised: 08/01/2011 Document Reviewed: 12/08/2008 [facility] Patient Information 2014 [place], [place].

## 2013-07-18 ENCOUNTER — Ambulatory Visit: Payer: BC Managed Care – PPO | Admitting: Obstetrics & Gynecology

## 2013-08-05 ENCOUNTER — Encounter: Payer: Self-pay | Admitting: Obstetrics

## 2013-08-05 ENCOUNTER — Ambulatory Visit (INDEPENDENT_AMBULATORY_CARE_PROVIDER_SITE_OTHER): Payer: BC Managed Care – PPO | Admitting: Obstetrics

## 2013-08-05 DIAGNOSIS — Z3009 Encounter for other general counseling and advice on contraception: Secondary | ICD-10-CM

## 2013-08-05 MED ORDER — LEVONORGESTREL-ETHINYL ESTRAD 0.15-30 MG-MCG PO TABS: 1.0000 | ORAL_TABLET | Freq: Every day | ORAL | Status: DC

## 2013-08-05 NOTE — Progress Notes (Signed)
Subjective:     Carla [name] is a 23 y.o. female who presents for a postpartum visit. She is 9 weeks postpartum following a spontaneous vaginal delivery. I have fully reviewed the prenatal and intrapartum course. The delivery was at 38 gestational weeks. Outcome: spontaneous vaginal delivery. Anesthesia: epidural. Postpartum course has been WNL. Baby's course has been WNL. Baby is feeding by bottle - Daron OfferGerber Goodstart Soothe . Bleeding no bleeding. Bowel function is normal. Bladder function is normal. Patient is sexually active. Contraception method is OCP (estrogen/progesterone). Postpartum depression screening: negative. Patient states sometimes she gets light headed and dizzy.   The following portions of the patient's history were reviewed and updated as appropriate: allergies, current medications, past family history, past medical history, past social history, past surgical history and problem list.  Review of Systems Pertinent items are noted in HPI.   Objective:    BP 135/84  Pulse 77  Temp(Src) 98.2 F (36.8 C)  Ht 5\' 4"  (1.626 m)  Wt 176 lb (79.833 kg)  BMI 30.20 kg/m2  LMP 07/27/2013  Breastfeeding? No  General:  alert and no distress Breasts:  Negative Abdomen:  Soft, NT. Pelvic:  NEFG:  Uterus NSSC, NT.  Adnexa negative.   Assessment:     Normal postpartum exam. Pap smear not done at today's visit.   Plan:    1. Contraception: OCP (estrogen/progesterone) 2. Continue PNV 3. Follow up in: several months or as needed.

## 2013-08-06 ENCOUNTER — Encounter: Payer: Self-pay | Admitting: Obstetrics

## 2013-10-28 ENCOUNTER — Ambulatory Visit: Payer: BC Managed Care – PPO | Admitting: Obstetrics & Gynecology

## 2013-10-30 ENCOUNTER — Ambulatory Visit: Payer: BC Managed Care – PPO | Admitting: Obstetrics & Gynecology

## 2013-11-07 ENCOUNTER — Encounter: Payer: Self-pay | Admitting: Obstetrics & Gynecology

## 2013-11-07 ENCOUNTER — Ambulatory Visit: Payer: BC Managed Care – PPO | Admitting: Obstetrics & Gynecology

## 2013-11-07 ENCOUNTER — Encounter: Payer: Self-pay | Admitting: *Deleted

## 2013-11-07 ENCOUNTER — Ambulatory Visit (INDEPENDENT_AMBULATORY_CARE_PROVIDER_SITE_OTHER): Payer: BC Managed Care – PPO | Admitting: Obstetrics & Gynecology

## 2013-11-07 VITALS — BP 130/87 | HR 71 | Temp 98.2°F | Ht 64.0 in | Wt 186.0 lb

## 2013-11-07 DIAGNOSIS — N83209 Unspecified ovarian cyst, unspecified side: Secondary | ICD-10-CM

## 2013-11-07 DIAGNOSIS — R1030 Lower abdominal pain, unspecified: Secondary | ICD-10-CM

## 2013-11-07 DIAGNOSIS — N949 Unspecified condition associated with female genital organs and menstrual cycle: Secondary | ICD-10-CM

## 2013-11-07 DIAGNOSIS — Z3202 Encounter for pregnancy test, result negative: Secondary | ICD-10-CM

## 2013-11-07 DIAGNOSIS — R109 Unspecified abdominal pain: Secondary | ICD-10-CM

## 2013-11-07 DIAGNOSIS — R102 Pelvic and perineal pain: Secondary | ICD-10-CM

## 2013-11-07 LAB — CBC
HCT: 39.4 % (ref 36.0–46.0)
HEMOGLOBIN: 13.5 g/dL (ref 12.0–15.0)
MCH: 29.6 pg (ref 26.0–34.0)
MCHC: 34.3 g/dL (ref 30.0–36.0)
MCV: 86.4 fL (ref 78.0–100.0)
Platelets: 298 10*3/uL (ref 150–400)
RBC: 4.56 MIL/uL (ref 3.87–5.11)
RDW: 13.7 % (ref 11.5–15.5)
WBC: 7.4 10*3/uL (ref 4.0–10.5)

## 2013-11-07 LAB — POCT URINALYSIS DIPSTICK
Glucose, UA: NEGATIVE
KETONES UA: NEGATIVE
Leukocytes, UA: NEGATIVE
Nitrite, UA: NEGATIVE
PROTEIN UA: NEGATIVE
Spec Grav, UA: <=1.005
pH, UA: 5

## 2013-11-07 LAB — POCT URINE PREGNANCY: Preg Test, Ur: NEGATIVE

## 2013-11-08 DIAGNOSIS — R1030 Lower abdominal pain, unspecified: Secondary | ICD-10-CM | POA: Insufficient documentation

## 2013-11-08 LAB — WET PREP BY MOLECULAR PROBE
Candida species: NEGATIVE
Gardnerella vaginalis: POSITIVE — AB
Trichomonas vaginosis: NEGATIVE

## 2013-11-08 NOTE — Patient Instructions (Signed)
Ovarian Cyst An ovarian cyst is a fluid-filled sac that forms on an ovary. The ovaries are small organs that produce eggs in women. Various types of cysts can form on the ovaries. Most are not cancerous. Many do not cause problems, and they often go away on their own. Some may cause symptoms and require treatment. Common types of ovarian cysts include:  Functional cysts--These cysts may occur every month during the menstrual cycle. This is normal. The cysts usually go away with the next menstrual cycle if the woman does not get pregnant. Usually, there are no symptoms with a functional cyst.  Endometrioma cysts--These cysts form from the tissue that lines the uterus. They are also called "chocolate cysts" because they become filled with blood that turns brown. This type of cyst can cause pain in the lower abdomen during intercourse and with your menstrual period.  Cystadenoma cysts--This type develops from the cells on the outside of the ovary. These cysts can get very big and cause lower abdomen pain and pain with intercourse. This type of cyst can twist on itself, cut off its blood supply, and cause severe pain. It can also easily rupture and cause a lot of pain.  Dermoid cysts--This type of cyst is sometimes found in both ovaries. These cysts may contain different kinds of body tissue, such as skin, teeth, hair, or cartilage. They usually do not cause symptoms unless they get very big.  Theca lutein cysts--These cysts occur when too much of a certain hormone (human chorionic gonadotropin) is produced and overstimulates the ovaries to produce an egg. This is most common after procedures used to assist with the conception of a baby (in vitro fertilization). CAUSES   Fertility drugs can cause a condition in which multiple large cysts are formed on the ovaries. This is called ovarian hyperstimulation syndrome.  A condition called polycystic ovary syndrome can cause hormonal imbalances that can lead to  nonfunctional ovarian cysts. SIGNS AND SYMPTOMS  Many ovarian cysts do not cause symptoms. If symptoms are present, they may include:  Pelvic pain or pressure.  Pain in the lower abdomen.  Pain during sexual intercourse.  Increasing girth (swelling) of the abdomen.  Abnormal menstrual periods.  Increasing pain with menstrual periods.  Stopping having menstrual periods without being pregnant. DIAGNOSIS  These cysts are commonly found during a routine or annual pelvic exam. Tests may be ordered to find out more about the cyst. These tests may include:  Ultrasound.  X-ray of the pelvis.  CT scan.  MRI.  Blood tests. TREATMENT  Many ovarian cysts go away on their own without treatment. Your health care provider may want to check your cyst regularly for 2-3 months to see if it changes. For women in menopause, it is particularly important to monitor a cyst closely because of the higher rate of ovarian cancer in menopausal women. When treatment is needed, it may include any of the following:  A procedure to drain the cyst (aspiration). This may be done using a long needle and ultrasound. It can also be done through a laparoscopic procedure. This involves using a thin, lighted tube with a tiny camera on the end (laparoscope) inserted through a small incision.  Surgery to remove the whole cyst. This may be done using laparoscopic surgery or an open surgery involving a larger incision in the lower abdomen.  Hormone treatment or birth control pills. These methods are sometimes used to help dissolve a cyst. HOME CARE INSTRUCTIONS   Only take over-the-counter   or prescription medicines as directed by your health care provider.  Follow up with your health care provider as directed.  Get regular pelvic exams and Pap tests. SEEK MEDICAL CARE IF:   Your periods are late, irregular, or painful, or they stop.  Your pelvic pain or abdominal pain does not go away.  Your abdomen becomes  larger or swollen.  You have pressure on your bladder or trouble emptying your bladder completely.  You have pain during sexual intercourse.  You have feelings of fullness, pressure, or discomfort in your stomach.  You lose weight for no apparent reason.  You feel generally ill.  You become constipated.  You lose your appetite.  You develop acne.  You have an increase in body and facial hair.  You are gaining weight, without changing your exercise and eating habits.  You think you are pregnant. SEEK IMMEDIATE MEDICAL CARE IF:   You have increasing abdominal pain.  You feel sick to your stomach (nauseous), and you throw up (vomit).  You develop a fever that comes on suddenly.  You have abdominal pain during a bowel movement.  Your menstrual periods become heavier than usual. MAKE SURE YOU:  Understand these instructions.  Will watch your condition.  Will get help right away if you are not doing well or get worse. Document Released: 05/09/2005 Document Revised: 05/14/2013 Document Reviewed: 01/14/2013 [employer] Patient Information 2015 [employer]. This information is not intended to replace advice given to you by your health care provider. Make sure you discuss any questions you have with your health care provider.  

## 2013-11-08 NOTE — Progress Notes (Signed)
Patient ID: Carla IhaCindy L Heyboer, female   DOB: August 26, 1990, 23 y.o.   MRN: [id number]  Chief Complaint  Patient presents with  . Abdominal pain    HPI Carla [name] is a 23 y.o. female.   Gynecologic Exam Associated symptoms include abdominal pain. Pertinent negatives include no constipation, diarrhea, dysuria, frequency, hematuria, nausea or vomiting. (Functional ovarian cyst)  Abdominal Pain This is a new problem. The current episode started 1 to 4 weeks ago. The onset quality is gradual. The problem occurs intermittently. The problem has been unchanged. The pain is moderate. The abdominal pain does not radiate. Pertinent negatives include no constipation, diarrhea, dysuria, frequency, hematuria, nausea or vomiting. Nothing aggravates the pain. The pain is relieved by nothing. She has tried acetaminophen for the symptoms. The treatment provided significant relief. functional ovarian cyst    Past Medical History  Diagnosis Date  . Spontaneous abortion 12/03/2010  . Allergy   . Depression   . PONV (postoperative nausea and vomiting)   . OZDGUYQI(347.4Headache(784.0)     Past Surgical History  Procedure Laterality Date  . Dilation and evacuation  12/03/2010    Procedure: DILATATION AND EVACUATION (D&E);  Surgeon: Roseanna RainbowLisa A Jackson-Moore, MD;  Location: [facility];  Service: Gynecology;  Laterality: N/A;  . Ovarian cyst removal    . Tonsillectomy      Family History  Problem Relation Age of Onset  . Hypertension Other   . Diabetes Other   . Stroke Other   . Cancer Other   . Heart failure Other   . Seizures Other     Social History History  Substance Use Topics  . Smoking status: Never Smoker   . Smokeless tobacco: Never Used  . Alcohol Use: No    Allergies  Allergen Reactions  . Shellfish Allergy Anaphylaxis  . Latex Itching    Current Outpatient Prescriptions  Medication Sig Dispense Refill  . ferrous sulfate 325 (65 FE) MG tablet Take 1 tablet (325 mg total) by mouth 2 (two) times daily  with a meal.  60 tablet  3  . ibuprofen (ADVIL,MOTRIN) 600 MG tablet Take 1 tablet (600 mg total) by mouth every 6 (six) hours as needed (pain scale < 4).  30 tablet  0  . levonorgestrel-ethinyl estradiol (NORDETTE) 0.15-30 MG-MCG tablet Take 1 tablet by mouth daily.  1 Package  11  . Prenatal Vit-Fe Fumarate-FA (PRENATAL MULTIVITAMIN) TABS tablet Take 1 tablet by mouth daily at 12 noon.       No current facility-administered medications for this visit.    Review of Systems Review of Systems  Gastrointestinal: Positive for abdominal pain. Negative for nausea, vomiting, diarrhea and constipation.  Genitourinary: Negative for dysuria, frequency and hematuria.   Constitutional: negative for fatigue and weight loss Respiratory: negative for cough and wheezing Cardiovascular: negative for chest pain, fatigue and palpitations Gastrointestinal: negative for abdominal pain and change in bowel habits Genitourinary:negative for abnormal discharge Integument/breast: negative for nipple discharge Musculoskeletal:negative for myalgias Neurological: negative for gait problems and tremors Behavioral/Psych: negative for abusive relationship, depression Endocrine: negative for temperature intolerance     Blood pressure 130/87, pulse 71, temperature 98.2 F (36.8 C), height 5\' 4"  (1.626 m), weight 84.369 kg (186 lb), last menstrual period 10/23/2013, not currently breastfeeding.  Physical Exam Physical Exam General:   alert  Skin:   no rash or abnormalities  Lungs:   clear to auscultation bilaterally  Heart:   regular rate and rhythm, S1, S2 normal, no murmur, click, rub or  gallop  Breasts:   normal without suspicious masses, skin or nipple changes or axillary nodes  Abdomen:  normal findings: no organomegaly, soft, non-tender and no hernia  Pelvis:  External genitalia: normal general appearance Urinary system: urethral meatus normal and bladder without fullness, nontender Vaginal: normal without  tenderness, induration or masses Cervix: normal appearance Adnexa: normal bimanual exam Uterus: anteverted and non-tender, normal size    Limited U/S: fluid surrounding left ovary  Data Reviewed none  Assessment    Likely leaking functional ovarian cyst     Plan    Orders Placed This Encounter  Procedures  . GC/Chlamydia Probe Amp  . WET PREP BY MOLECULAR PROBE  . Urine culture  . US Transvaginal Non-OB    Standing Status: Future     Number of Occurrences:      Standing Expiration Date: 01/08/2015    Order Specific Question:  Reason for Exam (SYMPTOM  OR DIAGNOSIS REQUIRED)    Answer:  pelvic pain    Order Specific Question:  Preferred imaging location?    Answer:  Internal  . US Pelvis Complete    Standing Status: Future     Number of Occurrences:      Standing Expiration Date: 01/08/2015    Order Specific Question:  Reason for Exam (SYMPTOM  OR DIAGNOSIS REQUIRED)    Answer:  Pelvic pain    Order Specific Question:  Preferred imaging location?    Answer:  Internal  . CBC  . POCT urinalysis dipstick  . POCT urine pregnancy    Follow up as needed.         JACKSON-MOORE,Bethani Brugger A 11/08/2013, 9:05 PM

## 2013-11-09 LAB — URINE CULTURE

## 2013-11-09 LAB — GC/CHLAMYDIA PROBE AMP
CT Probe RNA: NEGATIVE
GC PROBE AMP APTIMA: NEGATIVE

## 2013-12-23 ENCOUNTER — Other Ambulatory Visit: Payer: Self-pay | Admitting: *Deleted

## 2013-12-23 DIAGNOSIS — N760 Acute vaginitis: Principal | ICD-10-CM

## 2013-12-23 DIAGNOSIS — B9689 Other specified bacterial agents as the cause of diseases classified elsewhere: Secondary | ICD-10-CM

## 2013-12-23 MED ORDER — METRONIDAZOLE 500 MG PO TABS: 500.0000 mg | ORAL_TABLET | Freq: Two times a day (BID) | ORAL | Status: DC

## 2014-01-01 ENCOUNTER — Ambulatory Visit (INDEPENDENT_AMBULATORY_CARE_PROVIDER_SITE_OTHER): Payer: BC Managed Care – PPO

## 2014-01-01 DIAGNOSIS — R102 Pelvic and perineal pain: Secondary | ICD-10-CM

## 2014-01-01 DIAGNOSIS — Z8041 Family history of malignant neoplasm of ovary: Secondary | ICD-10-CM

## 2014-01-01 DIAGNOSIS — R1032 Left lower quadrant pain: Secondary | ICD-10-CM

## 2014-01-01 DIAGNOSIS — N946 Dysmenorrhea, unspecified: Secondary | ICD-10-CM

## 2014-01-02 ENCOUNTER — Telehealth: Payer: Self-pay

## 2014-01-02 NOTE — Telephone Encounter (Signed)
told patient about balance that she owes - 35.00 in misys

## 2014-01-07 ENCOUNTER — Emergency Department (HOSPITAL_COMMUNITY)
Admission: EM | Admit: 2014-01-07 | Discharge: 2014-01-07 | Disposition: A | Discharge status: Home / Self Care | Payer: BC Managed Care – PPO | Attending: Emergency Medicine | Admitting: Emergency Medicine

## 2014-01-07 ENCOUNTER — Encounter (HOSPITAL_COMMUNITY): Payer: Self-pay | Admitting: Emergency Medicine

## 2014-01-07 DIAGNOSIS — Z8659 Personal history of other mental and behavioral disorders: Secondary | ICD-10-CM | POA: Diagnosis not present

## 2014-01-07 DIAGNOSIS — J029 Acute pharyngitis, unspecified: Secondary | ICD-10-CM

## 2014-01-07 DIAGNOSIS — Z91040 Latex allergy status: Secondary | ICD-10-CM | POA: Diagnosis not present

## 2014-01-07 DIAGNOSIS — Z79899 Other long term (current) drug therapy: Secondary | ICD-10-CM | POA: Diagnosis not present

## 2014-01-07 LAB — RAPID STREP SCREEN (MED CTR MEBANE ONLY): Streptococcus, Group A Screen (Direct): NEGATIVE

## 2014-01-07 MED ORDER — DM-GUAIFENESIN ER 30-600 MG PO TB12: 1.0000 | ORAL_TABLET | Freq: Two times a day (BID) | ORAL | Status: DC

## 2014-01-07 MED ORDER — IBUPROFEN 800 MG PO TABS: 800.0000 mg | ORAL_TABLET | Freq: Three times a day (TID) | ORAL | Status: DC

## 2014-01-07 NOTE — ED Provider Notes (Signed)
CSN: [id number]     Arrival date & time 01/07/14  1142 History  This chart was scribed for non-physician practitioner, Mellody DrownLauren Ellionna Buckbee, PA-C working with Rolland PorterMark James, MD by Greggory StallionKayla Andersen, ED scribe. This patient was seen in room TR11C/TR11C and the patient's care was started at 12:44 PM.    Chief Complaint  Patient presents with  . Sore Throat   HPI Comments: Carla [name] is a 23 y.o. female who presents to the Emergency Department complaining of gradual onset sore throat that started yesterday. Reports associated fever that started this morning. States it was 100.4. Pt has history of frequent strep throat and states this feels similar. She has taken tylenol with some relief. Last dose was prior to arrival. 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 Denies nasal congestion, cough.   The history is provided by the patient. No language interpreter was used.    Past Medical History  Diagnosis Date  . Spontaneous abortion 12/03/2010  . Allergy   . Depression   . PONV (postoperative nausea and vomiting)   . QMVHQION(629.5Headache(784.0)    Past Surgical History  Procedure Laterality Date  . Dilation and evacuation  12/03/2010    Procedure: DILATATION AND EVACUATION (D&E);  Surgeon: Roseanna RainbowLisa A Jackson-Moore, MD;  Location: [facility];  Service: Gynecology;  Laterality: N/A;  . Ovarian cyst removal    . Tonsillectomy     Family History  Problem Relation Age of Onset  . Hypertension Other   . Diabetes Other   . Stroke Other   . Cancer Other   . Heart failure Other   . Seizures Other    History  Substance Use Topics  . Smoking status: Never Smoker   . Smokeless tobacco: Never Used  . Alcohol Use: No   OB History   Grav Para Term Preterm Abortions TAB SAB Ect Mult Living   4 1 1  3  3   1      Review of Systems  Constitutional: Positive for fever.  HENT: Positive for congestion, rhinorrhea and sore throat.   Respiratory: Negative for cough.   All other systems reviewed and are  negative.  Allergies  Shellfish allergy and Latex  Home Medications   Prior to Admission medications   Medication Sig Start Date End Date Taking? Authorizing Provider  ferrous sulfate 325 (65 FE) MG tablet Take 1 tablet (325 mg total) by mouth 2 (two) times daily with a meal. 05/27/13   Amy Dessa PhiHowell Wren, CNM  ibuprofen (ADVIL,MOTRIN) 600 MG tablet Take 1 tablet (600 mg total) by mouth every 6 (six) hours as needed (pain scale < 4). 05/27/13   Amy Dessa PhiHowell Wren, CNM   BP 130/84  Pulse 90  Temp(Src) 98.5 F (36.9 C) (Oral)  Resp 16  SpO2 100%  Physical Exam  Nursing note and vitals reviewed. Constitutional: She is oriented to person, place, and time. She appears well-developed and well-nourished.  Non-toxic appearance. She does not have a sickly appearance. She does not appear ill. No distress.  HENT:  Head: Normocephalic and atraumatic.  Right Ear: Tympanic membrane, external ear and ear canal normal. No middle ear effusion.  Left Ear: Tympanic membrane, external ear and ear canal normal.  No middle ear effusion.  Nose: Rhinorrhea present.  Mouth/Throat: Uvula is midline and mucous membranes are normal. No oral lesions. No trismus in the jaw. Posterior oropharyngeal erythema present. No oropharyngeal exudate, posterior oropharyngeal edema or tonsillar abscesses.  Eyes: Conjunctivae and EOM are  normal. Pupils are equal, round, and reactive to light. Right eye exhibits no discharge. Left eye exhibits no discharge.  Neck: Normal range of motion. Neck supple.  Cardiovascular: Normal rate and regular rhythm.   No murmur heard. Pulmonary/Chest: Effort normal and breath sounds normal. No respiratory distress. She has no wheezes. She has no rales.  Abdominal: Soft.  Musculoskeletal: Normal range of motion.  Lymphadenopathy:    She has no cervical adenopathy.  Neurological: She is alert and oriented to person, place, and time.  Skin: Skin is warm and dry. No rash noted. She is not diaphoretic.   Psychiatric: She has a normal mood and affect. Her behavior is normal.    ED Course  Procedures (including critical care time)  COORDINATION OF CARE: 12:46 PM-Discussed treatment plan which includes salt water gargles, ibuprofen and tylenol with pt at bedside and pt agreed to plan.   Labs Review Labs Reviewed  RAPID STREP SCREEN  CULTURE, GROUP A STREP    Imaging Review No results found.   EKG Interpretation None      MDM   Final diagnoses:  Pharyngitis   Pt with negative rapid strep. Patients symptoms are consistent with URI, likely viral etiology. Discussed that antibiotics are not indicated for viral infections. Pt will be discharged with symptomatic treatment.  Verbalizes understanding and is agreeable with plan. Pt is hemodynamically stable & in NAD prior to dc. Meds given in ED:  Medications - No data to display  Discharge Medication List as of 01/07/2014  1:15 PM    START taking these medications   Details  dextromethorphan-guaiFENesin (MUCINEX DM) 30-600 MG per 12 hr tablet Take 1 tablet by mouth 2 (two) times daily., Starting 01/07/2014, Until Discontinued, Print    ibuprofen (ADVIL,MOTRIN) 800 MG tablet Take 1 tablet (800 mg total) by mouth 3 (three) times daily with meals., Starting 01/07/2014, Until Discontinued, Print       I personally performed the services described in this documentation, which was scribed in my presence. The recorded information has been reviewed and is accurate.  Mellody Drown, PA-C 01/07/14 [phone]

## 2014-01-07 NOTE — Discharge Instructions (Signed)
Call for a follow up appointment with a Family or Primary Care Provider.  °Return if Symptoms worsen.   °Take medication as prescribed.  °Salt water gargles 3-4 times a day.   °Drink plenty of fluids. °

## 2014-01-07 NOTE — ED Notes (Signed)
Pt c/o sore throat starting yesterday and fever this am; pt sts hx of frequent strep throat and feels same; pt took tylenol PTA

## 2014-01-08 ENCOUNTER — Ambulatory Visit: Payer: BC Managed Care – PPO | Admitting: Obstetrics & Gynecology

## 2014-01-09 LAB — CULTURE, GROUP A STREP

## 2014-01-12 NOTE — ED Provider Notes (Signed)
Medical screening examination/treatment/procedure(s) were performed by non-physician practitioner and as supervising physician I was immediately available for consultation/collaboration.   EKG Interpretation None        Karion Cudd, MD 01/12/14 1657 

## 2014-01-20 ENCOUNTER — Ambulatory Visit: Payer: BC Managed Care – PPO | Admitting: Obstetrics & Gynecology

## 2014-01-22 ENCOUNTER — Ambulatory Visit: Payer: BC Managed Care – PPO | Admitting: Obstetrics & Gynecology

## 2014-03-24 ENCOUNTER — Encounter (HOSPITAL_COMMUNITY): Payer: Self-pay | Admitting: Emergency Medicine

## 2014-04-15 IMAGING — US US OB LIMITED
1 series · 13 of 15 positions shown · non-contrast
Comparison: none

[Series 1: us ob limited · 0.11mm/px · 13 of 15 slices shown]
[im 1/15]
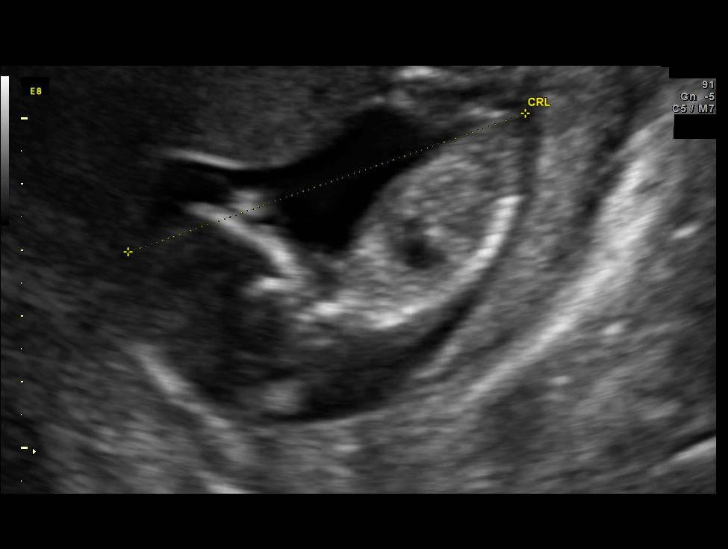
[im 2/15]
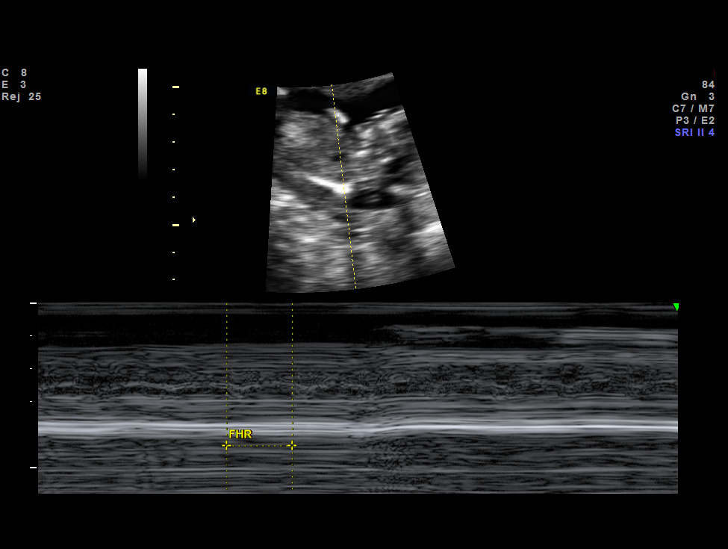
[im 3/15]
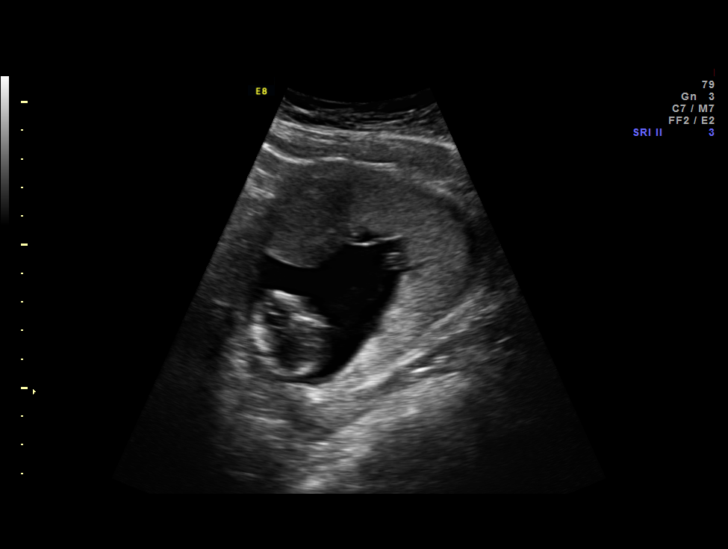
[im 5/15]
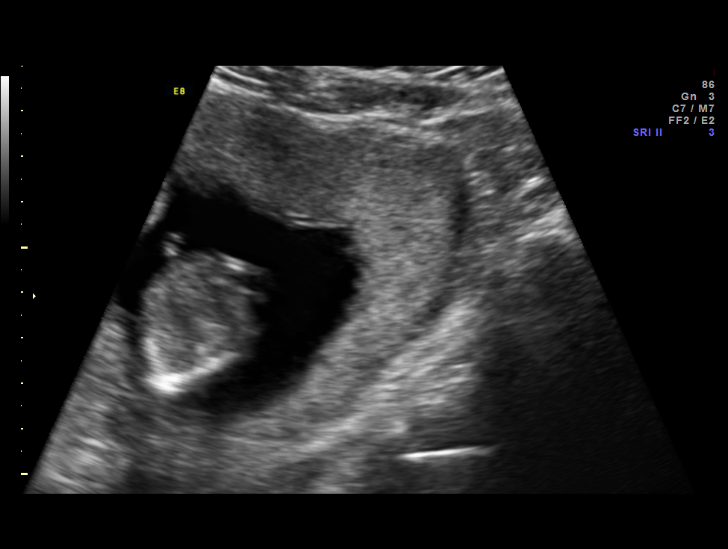
[im 6/15]
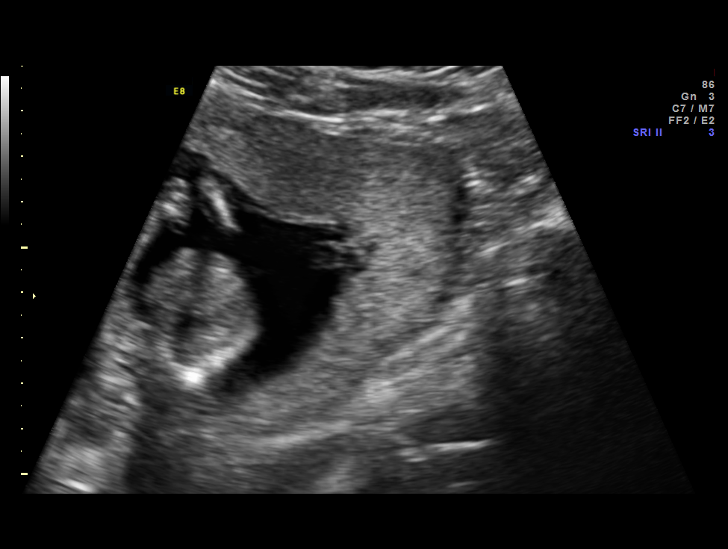
[im 7/15]
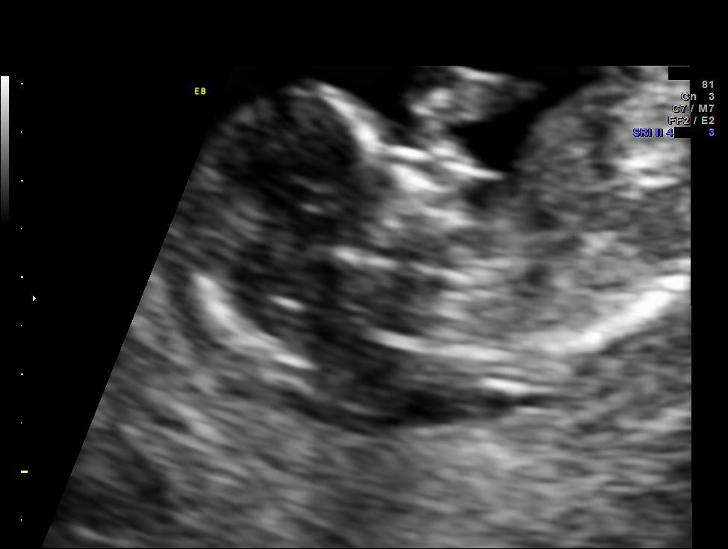
[im 8/15]
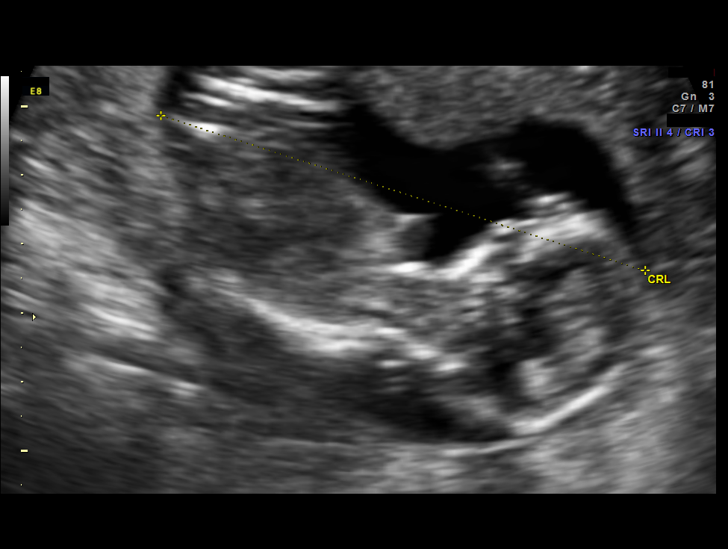
[im 9/15]
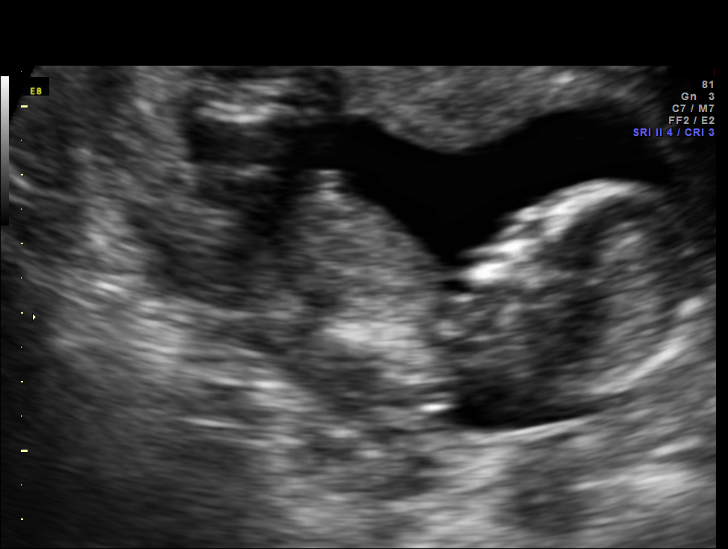
[im 10/15]
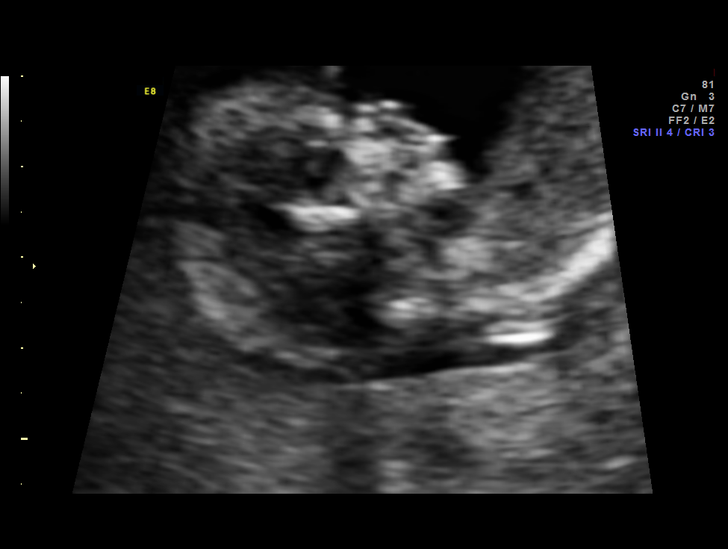
[im 11/15]
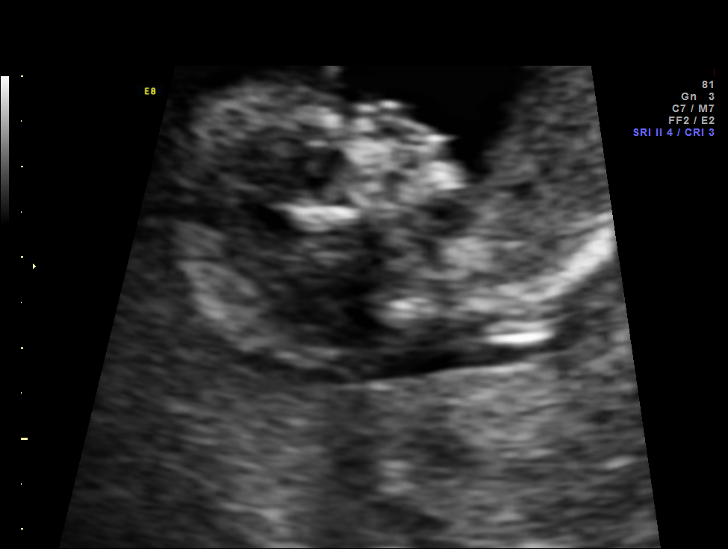
[im 13/15]
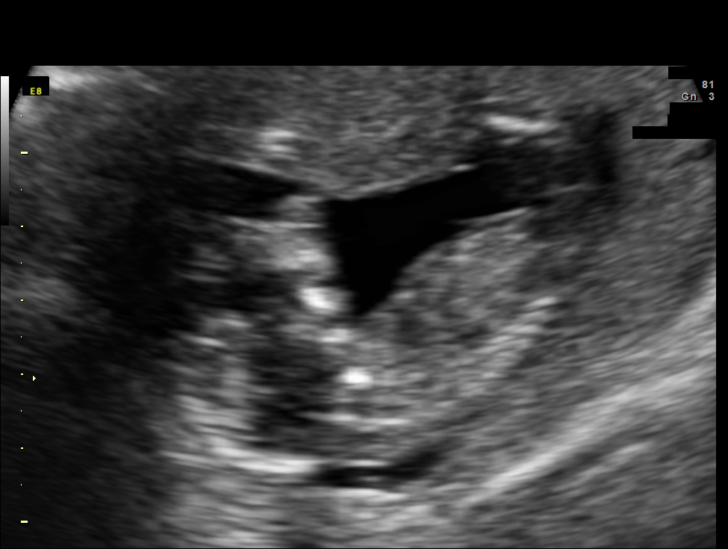
[im 14/15]
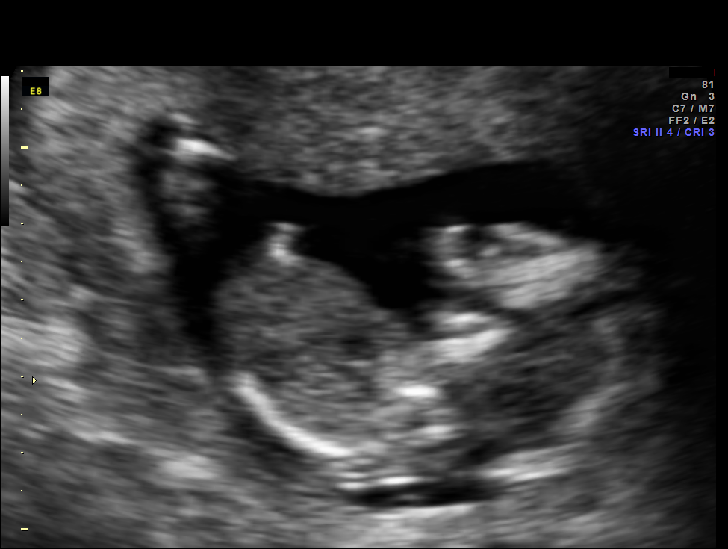
[im 15/15]
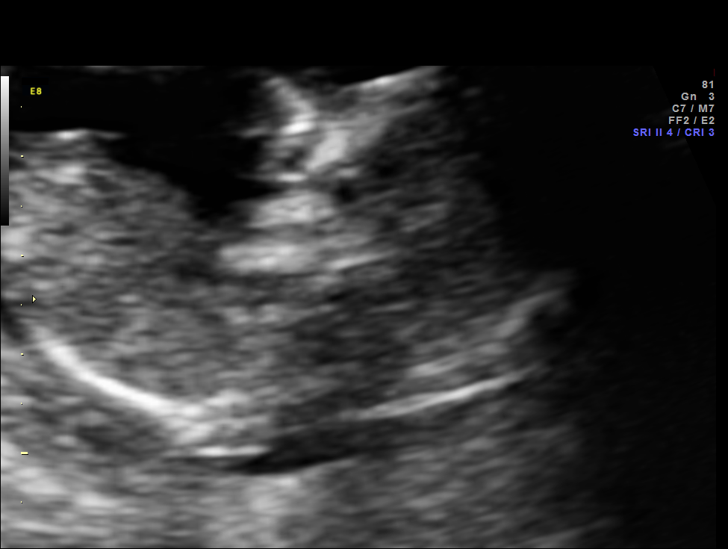

[13 of 15 positions shown; findings below may reference images not displayed]

OBSTETRICS REPORT
                      (Signed Final 11/29/2012 [DATE])

Service(s) Provided

 [HOSPITAL]                                         76815.0
Indications

 First trimester aneuploidy screen (NT) - second
 attempt
Fetal Evaluation

 Num Of Fetuses:    1
 Fetal Heart Rate:  153                          bpm
 Cardiac Activity:  Observed
 Placenta:          Anterior, above cervical os
 P. Cord            Visualized
 Insertion:

 Amniotic Fluid
 AFI FV:      Subjectively within normal limits
Gestational Age

 LMP:           16w 2d        Date:  08/07/12                 EDD:   05/14/13
 Best:          13w 3d     Det. By:  U/S C R L (10/30/12)     EDD:   06/03/13
1st Trimester Genetic Sonogram Screening

 CRL:            73.9  mm    G. Age:   13w 2d                 EDD:   06/04/13
Cervix Uterus Adnexa

 Cervix:       Normal appearance by transabdominal scan.
Impression

 IUP at 13+3 weeks
 No gross abnormalities identified
 Second attempt at obtaining a NT measurement was
 unsuccessful
 Normal amniotic fluid volume
 Measurements consistent with prior US

 All of their prenatal testing options were reviewed and she
 chose to have sequential screening. Blood was drawn today.
Recommendations

 Follow-up ultrasound in 2-3 weeks for the second blood draw

 questions or concerns.

## 2014-05-19 ENCOUNTER — Encounter: Payer: Self-pay | Admitting: *Deleted

## 2014-05-20 ENCOUNTER — Encounter: Payer: Self-pay | Admitting: Obstetrics & Gynecology

## 2014-07-03 ENCOUNTER — Other Ambulatory Visit: Payer: Self-pay | Admitting: Obstetrics

## 2014-07-03 NOTE — Telephone Encounter (Signed)
Please advise 

## 2014-08-06 ENCOUNTER — Other Ambulatory Visit: Payer: Self-pay | Admitting: Obstetrics

## 2014-08-06 DIAGNOSIS — Z3040 Encounter for surveillance of contraceptives, unspecified: Secondary | ICD-10-CM

## 2014-09-24 ENCOUNTER — Encounter (HOSPITAL_COMMUNITY): Payer: Self-pay | Admitting: Emergency Medicine

## 2014-09-24 ENCOUNTER — Emergency Department (HOSPITAL_COMMUNITY)
Admission: EM | Admit: 2014-09-24 | Discharge: 2014-09-24 | Disposition: A | Discharge status: Home / Self Care | Payer: BC Managed Care – PPO | Attending: Emergency Medicine | Admitting: Emergency Medicine

## 2014-09-24 DIAGNOSIS — S199XXA Unspecified injury of neck, initial encounter: Secondary | ICD-10-CM | POA: Insufficient documentation

## 2014-09-24 DIAGNOSIS — Z8659 Personal history of other mental and behavioral disorders: Secondary | ICD-10-CM | POA: Insufficient documentation

## 2014-09-24 DIAGNOSIS — Z791 Long term (current) use of non-steroidal anti-inflammatories (NSAID): Secondary | ICD-10-CM | POA: Insufficient documentation

## 2014-09-24 DIAGNOSIS — X58XXXA Exposure to other specified factors, initial encounter: Secondary | ICD-10-CM | POA: Insufficient documentation

## 2014-09-24 DIAGNOSIS — Y9289 Other specified places as the place of occurrence of the external cause: Secondary | ICD-10-CM | POA: Insufficient documentation

## 2014-09-24 DIAGNOSIS — Z79899 Other long term (current) drug therapy: Secondary | ICD-10-CM | POA: Insufficient documentation

## 2014-09-24 DIAGNOSIS — Y9389 Activity, other specified: Secondary | ICD-10-CM | POA: Insufficient documentation

## 2014-09-24 DIAGNOSIS — Y998 Other external cause status: Secondary | ICD-10-CM | POA: Insufficient documentation

## 2014-09-24 DIAGNOSIS — Z91040 Latex allergy status: Secondary | ICD-10-CM | POA: Insufficient documentation

## 2014-09-24 DIAGNOSIS — M542 Cervicalgia: Secondary | ICD-10-CM

## 2014-09-24 MED ORDER — KETOROLAC TROMETHAMINE 60 MG/2ML IM SOLN
60.0000 mg | Freq: Once | INTRAMUSCULAR | Status: AC
  Administered 2014-09-24: 60 mg via INTRAMUSCULAR
  Filled 2014-09-24: qty 2

## 2014-09-24 MED ORDER — CYCLOBENZAPRINE HCL 10 MG PO TABS: 10.0000 mg | ORAL_TABLET | Freq: Two times a day (BID) | ORAL | Status: DC | PRN

## 2014-09-24 NOTE — ED Provider Notes (Signed)
CSN: [id number]     Arrival date & time 09/24/14  [id number] History   First MD Initiated Contact with Patient 09/24/14 [phone]     Chief Complaint  Patient presents with  . Neck Pain     (Consider location/radiation/quality/duration/timing/severity/associated sxs/prior Treatment) HPI  Carla [name] is a 24 y.o. female presenting with left sided neck pain that started this morning after turning over in bed and heard pop in neck. Pain not improved with tylenol. Worse with movement and turning head. No numbness tingling or radiation of pain. No redness or swelling. No other pain.   Past Medical History  Diagnosis Date  . Spontaneous abortion 12/03/2010  . Allergy   . Depression   . PONV (postoperative nausea and vomiting)   . WNUUVOZD(664.4Headache(784.0)    Past Surgical History  Procedure Laterality Date  . Dilation and evacuation  12/03/2010    Procedure: DILATATION AND EVACUATION (D&E);  Surgeon: Roseanna RainbowLisa A Jackson-Moore, MD;  Location: [facility];  Service: Gynecology;  Laterality: N/A;  . Ovarian cyst removal    . Tonsillectomy     Family History  Problem Relation Age of Onset  . Hypertension Other   . Diabetes Other   . Stroke Other   . Cancer Other   . Heart failure Other   . Seizures Other    History  Substance Use Topics  . Smoking status: Never Smoker   . Smokeless tobacco: Never Used  . Alcohol Use: No   OB History    Gravida Para Term Preterm AB TAB SAB Ectopic Multiple Living   4 1 1  3  3   1      Review of Systems  Musculoskeletal: Positive for myalgias. Negative for joint swelling.  Skin: Negative for color change and wound.  Neurological: Negative for weakness and numbness.      Allergies  Shellfish allergy and Latex  Home Medications   Prior to Admission medications   Medication Sig Start Date End Date Taking? Authorizing Provider  acetaminophen (TYLENOL) 500 MG tablet Take 1,000 mg by mouth every 6 (six) hours as needed for fever.    Historical Provider, MD   cyclobenzaprine (FLEXERIL) 10 MG tablet Take 1 tablet (10 mg total) by mouth 2 (two) times daily as needed for muscle spasms. 09/24/14   Oswaldo ConroyVictoria Michiah Masse, PA-C  dextromethorphan-guaiFENesin [facility](MUCINEX DM) 30-600 MG per 12 hr tablet Take 1 tablet by mouth 2 (two) times daily. 01/07/14   Mellody DrownLauren Parker, PA-C  ibuprofen (ADVIL,MOTRIN) 800 MG tablet Take 1 tablet (800 mg total) by mouth 3 (three) times daily with meals. 01/07/14   Lauren Parker, PA-C  LEVORA 0.15/30, 28, 0.15-30 MG-MCG tablet TAKE 1 TABLET BY MOUTH EVERY DAY 08/06/14   Brock Badharles A Harper, MD   BP 133/86 mmHg  Pulse 78  Temp(Src) 98.7 F (37.1 C) (Oral)  Resp 18  Ht 5\' 4"  (1.626 m)  Wt 190 lb (86.183 kg)  BMI 32.60 kg/m2  SpO2 98% Physical Exam  Constitutional: Carla [name] appears well-developed and well-nourished. No distress.  HENT:  Head: Normocephalic and atraumatic.  Eyes: Conjunctivae and EOM are normal. Right eye exhibits no discharge. Left eye exhibits no discharge.  Neck:  45 range of motion of neck right and left. Tenderness/tightness/spasm of the left sternocleidomastoid muscle. No overlying skin changes.  Cardiovascular: Normal rate and regular rhythm.   2+ radial pulses equal bilaterally  Pulmonary/Chest: Effort normal and breath sounds normal. No respiratory distress. Carla [name] has no wheezes.  Abdominal: Soft. Bowel sounds are normal.  Carla [name] exhibits no distension. There is no tenderness.  Neurological: Carla [name] is alert. Carla [name] exhibits normal muscle tone. Coordination normal.  Bilateral grip strength intact. Sensation intact.  Skin: Skin is warm and dry. Carla [name] is not diaphoretic.  Nursing note and vitals reviewed.   ED Course  Procedures (including critical care time) Labs Review Labs Reviewed - No data to display  Imaging Review No results found.   EKG Interpretation None      MDM   Final diagnoses:  Neck pain on left side   Pt likely with torticollis. Neurovascularly intact. Discussed RICE, ibuprofen and flexeril for  severe pain. Driving and sedation precautions provided. Pt to follow up with PCP for persistent symptoms. Pt pain managed in ED.       Oswaldo ConroyVictoria Johny Pitstick, PA-C 09/24/14 0815  Oswaldo ConroyVictoria Shatonya Passon, PA-C 09/24/14 [id number]  Rolland PorterMark James, MD 09/28/14 [phone]

## 2014-09-24 NOTE — Discharge Instructions (Signed)
Return to the emergency room with worsening of symptoms, new symptoms or with symptoms that are concerning. RICE: Rest, Ice (three cycles of 20 mins on, 20mins off at least twice a day), compression/brace, elevation. Heating pad works well for back pain. Ibuprofen 400mg  (2 tablets 200mg ) every 5-6 hours for 3-5 days. Flexeril for severe pain. Follow up with wellness center for persistent symptom. Read below information and follow recommendations. Torticollis, Acute You have suddenly (acutely) developed a twisted neck (torticollis). This is usually a self-limited condition. CAUSES  Acute torticollis may be caused by malposition, trauma or infection. Most commonly, acute torticollis is caused by sleeping in an awkward position. Torticollis may also be caused by the flexion, extension or twisting of the neck muscles beyond their normal position. Sometimes, the exact cause may not be known. SYMPTOMS  Usually, there is pain and limited movement of the neck. Your neck may twist to one side. DIAGNOSIS  The diagnosis is often made by physical examination. X-rays, CT scans or MRIs may be done if there is a history of trauma or concern of infection. TREATMENT  For a common, stiff neck that develops during sleep, treatment is focused on relaxing the contracted neck muscle. Medications (including shots) may be used to treat the problem. Most cases resolve in several days. Torticollis usually responds to conservative physical therapy. If left untreated, the shortened and spastic neck muscle can cause deformities in the face and neck. Rarely, surgery is required. HOME CARE INSTRUCTIONS   Use over-the-counter and prescription medications as directed by your caregiver.  Do stretching exercises and massage the neck as directed by your caregiver.  Follow up with physical therapy if needed and as directed by your caregiver. SEEK IMMEDIATE MEDICAL CARE IF:   You develop difficulty breathing or noisy breathing  (stridor).  You drool, develop trouble swallowing or have pain with swallowing.  You develop numbness or weakness in the hands or feet.  You have changes in speech or vision.  You have problems with urination or bowel movements.  You have difficulty walking.  You have a fever.  You have increased pain. MAKE SURE YOU:   Understand these instructions.  Will watch your condition.  Will get help right away if you are not doing well or get worse. Document Released: 05/06/2000 Document Revised: 08/01/2011 Document Reviewed: 06/17/2009 [facility] Patient Information 2015 [employer] [place]. This information is not intended to replace advice given to you by your health care provider. Make sure you discuss any questions you have with your health care provider.

## 2014-09-24 NOTE — ED Notes (Signed)
Patient states that she was in bed and turned over to reach phone when she heard a pop in her neck.   Patient complains of pain around the back side of neck.   Patient states took tylenol for pain, but did not help.   Denies other symptoms.

## 2014-10-27 ENCOUNTER — Ambulatory Visit (INDEPENDENT_AMBULATORY_CARE_PROVIDER_SITE_OTHER): Payer: 59 | Admitting: Obstetrics

## 2014-10-27 ENCOUNTER — Encounter: Payer: Self-pay | Admitting: Obstetrics

## 2014-10-27 VITALS — BP 136/97 | HR 88 | Temp 98.6°F | Ht 64.0 in | Wt 194.0 lb

## 2014-10-27 DIAGNOSIS — N943 Premenstrual tension syndrome: Secondary | ICD-10-CM

## 2014-10-27 DIAGNOSIS — Z30011 Encounter for initial prescription of contraceptive pills: Secondary | ICD-10-CM

## 2014-10-27 DIAGNOSIS — Z01419 Encounter for gynecological examination (general) (routine) without abnormal findings: Secondary | ICD-10-CM

## 2014-10-27 DIAGNOSIS — Z3040 Encounter for surveillance of contraceptives, unspecified: Secondary | ICD-10-CM

## 2014-10-27 DIAGNOSIS — N946 Dysmenorrhea, unspecified: Secondary | ICD-10-CM

## 2014-10-27 MED ORDER — IBUPROFEN 800 MG PO TABS: 800.0000 mg | ORAL_TABLET | Freq: Three times a day (TID) | ORAL | Status: DC | PRN

## 2014-10-27 MED ORDER — LEVONORGESTREL-ETHINYL ESTRAD 0.15-30 MG-MCG PO TABS: 1.0000 | ORAL_TABLET | Freq: Every day | ORAL | Status: DC

## 2014-10-27 NOTE — Addendum Note (Signed)
Addended by: Coral CeoHARPER, Jo-Ann Johanning A on: 10/27/2014 05:14 PM   Modules accepted: Orders

## 2014-10-27 NOTE — Progress Notes (Addendum)
Subjective:        Carla [name] is a 24 y.o. female here for a routine exam.  Current complaints: None.    Personal health questionnaire:  Is patient Ashkenazi Jewish, have a family history of breast and/or ovarian cancer: no Is there a family history of uterine cancer diagnosed at age < 8, gastrointestinal cancer, urinary tract cancer, family member who is a Personnel officer syndrome-associated carrier: no Is the patient overweight and hypertensive, family history of diabetes, personal history of gestational diabetes, preeclampsia or PCOS: no Is patient over 38, have PCOS,  family history of premature CHD under age 40, diabetes, smoke, have hypertension or peripheral artery disease:  no At any time, has a partner hit, kicked or otherwise hurt or frightened you?: no Over the past 2 weeks, have you felt down, depressed or hopeless?: no Over the past 2 weeks, have you felt little interest or pleasure in doing things?:no   Gynecologic History Patient's last menstrual period was 09/26/2014. Contraception: OCP (estrogen/progesterone) Last Pap: 2-3 years ago. Results were: normal Last mammogram: n/a. Results were: n/a  Obstetric History OB History  Gravida Para Term Preterm AB SAB TAB Ectopic Multiple Living  # Outcome Date GA Lbr Len/2nd Weight Sex Delivery Anes PTL Lv  4 Term 05/25/13 [redacted]w[redacted]d 11:01 / 01:14 6 lb 12.8 oz (3.085 kg) F Vag-Spont EPI  Y  3 SAB           2 SAB           1 SAB               Past Medical History  Diagnosis Date  . Spontaneous abortion 12/03/2010  . Allergy   . Depression   . PONV (postoperative nausea and vomiting)   . ZOXWRUEA(540.9)     Past Surgical History  Procedure Laterality Date  . Dilation and evacuation  12/03/2010    Procedure: DILATATION AND EVACUATION (D&E);  Surgeon: Roseanna Rainbow, MD;  Location: [facility];  Service: Gynecology;  Laterality: N/A;  . Ovarian cyst removal    . Tonsillectomy       Current outpatient  prescriptions:  .  acetaminophen (TYLENOL) 500 MG tablet, Take 1,000 mg by mouth every 6 (six) hours as needed for fever., Disp: , Rfl:  .  levonorgestrel-ethinyl estradiol (LEVORA 0.15/30, 28,) 0.15-30 MG-MCG tablet, Take 1 tablet by mouth daily., Disp: 28 tablet, Rfl: 11 .  ibuprofen (ADVIL,MOTRIN) 800 MG tablet, Take 1 tablet (800 mg total) by mouth every 8 (eight) hours as needed., Disp: 30 tablet, Rfl: 11 Allergies  Allergen Reactions  . Shellfish Allergy Anaphylaxis  . Latex Itching    History  Substance Use Topics  . Smoking status: Never Smoker   . Smokeless tobacco: Never Used  . Alcohol Use: 0.0 oz/week    0 Standard drinks or equivalent per week     Comment: Ocassionally    Family History  Problem Relation Age of Onset  . Hypertension Other   . Diabetes Other   . Stroke Other   . Cancer Other   . Heart failure Other   . Seizures Other       Review of Systems  Constitutional: negative for fatigue and weight loss Respiratory: negative for cough and wheezing Cardiovascular: negative for chest pain, fatigue and palpitations Gastrointestinal: negative for abdominal pain and change in bowel habits Musculoskeletal:negative for myalgias Neurological: negative for gait problems  and tremors Behavioral/Psych: negative for abusive relationship, depression Endocrine: negative for temperature intolerance   Genitourinary:negative for abnormal menstrual periods, genital lesions, hot flashes, sexual problems and vaginal discharge Integument/breast: negative for breast lump, breast tenderness, nipple discharge and skin lesion(s)    Objective:       BP 136/97 mmHg  Pulse 88  Temp(Src) 98.6 F (37 C)  Ht 5\' 4"  (1.626 m)  Wt 194 lb (87.998 kg)  BMI 33.28 kg/m2  LMP 09/26/2014 General:   alert  Skin:   no rash or abnormalities  Lungs:   clear to auscultation bilaterally  Heart:   regular rate and rhythm, S1, S2 normal, no murmur, click, rub or gallop  Breasts:   normal  without suspicious masses, skin or nipple changes or axillary nodes  Abdomen:  normal findings: no organomegaly, soft, non-tender and no hernia  Pelvis:  External genitalia: normal general appearance Urinary system: urethral meatus normal and bladder without fullness, nontender Vaginal: normal without tenderness, induration or masses Cervix: normal appearance Adnexa: normal bimanual exam Uterus: anteverted and non-tender, normal size   Lab Review Urine pregnancy test Labs reviewed yes Radiologic studies reviewed no    Assessment:    Healthy female exam.    PMS  Dysmenorrhea  Contraceptive management.  Wants to continue OCP's.    Plan:   Considering medical therapy with SSRI for PMS. Ibuprofen Rx for Dysmenorrhea Continue OCP's   Education reviewed: low fat, low cholesterol diet, self breast exams and weight bearing exercise. Contraception: OCP (estrogen/progesterone). Follow up in: 1 year.   Meds ordered this encounter  Medications  . levonorgestrel-ethinyl estradiol (LEVORA 0.15/30, 28,) 0.15-30 MG-MCG tablet    Sig: Take 1 tablet by mouth daily.    Dispense:  28 tablet    Refill:  11  . ibuprofen (ADVIL,MOTRIN) 800 MG tablet    Sig: Take 1 tablet (800 mg total) by mouth every 8 (eight) hours as needed.    Dispense:  30 tablet    Refill:  11   No orders of the defined types were placed in this encounter.

## 2014-10-27 NOTE — Patient Instructions (Addendum)
Premenstrual Syndrome Premenstrual syndrome (PMS) is a condition that consists of physical, emotional, and behavioral symptoms that affect women of childbearing age. PMS occurs 5-14 days before the start of a menstrual period and often recurs in a predictable pattern. The symptoms go away a few days after the menstrual period starts. PMS can interfere in many ways with normal daily activities and can range from mild to severe. When PMS is considered severe, it Hornik be diagnosed as premenstrual dysphoric disorder (PMDD). A small percentage of women are affected by PMS symptoms and an even smaller percentage of those women are affected by PMDD.  CAUSES  The exact cause of PMS is unknown, but it seems to be related to cyclic hormone changes that happen before menstruation. These hormones are thought to affect chemicals in the brain (serotonin) that can influence a person's mood.  SYMPTOMS  Symptoms of PMS recur consistently from month to month and go away completely after the menstrual period starts. The most common emotional or behavioral symptom is mood swings. These mood swings can be disabling and interfere with normal activities of daily living. Other common symptoms include depression and angry outbursts. Other symptoms Ishler include:   Irritability.  Anxiety.  Crying spells.   Food cravings or appetite changes.   Changes in sexual desire.   Confusion.   Aggression.   Social withdrawal.   Poor concentration. The most common physical symptoms include a sense of bloating, breast pain, headaches, and extreme fatigue. Other physical symptoms include:   Backaches.   Swelling of the hands and feet.   Weight gain.   Hot flashes.  DIAGNOSIS  To make a diagnosis, your caregiver will ask questions to confirm that you are having a pattern of symptoms. Symptoms must:   Be present 5 days before the start of your period and be present at least 3 months in a row.   End within 4 days  after your period starts.   Interfere with some of your normal activities.  Other conditions, such as thyroid disease, depression, and migraine headaches must be ruled out before a diagnosis of PMS is confirmed.  TREATMENT  Your caregiver Mungo suggest ways to maintain a healthy lifestyle, such as exercise. Over-the-counter pain relievers Blower ease cramps, aches, pains, headaches, and breast tenderness. However, selective serotonin reuptake inhibitors (SSRIs) are medicines that are most beneficial in improving PMS if taken in the second half of the monthly cycle. They Janus be taken on a daily basis. The most effective oral contraceptive pill used for symptoms of PMS is one that contains the ingredient drospirenone. Taking 4 days off of the pill instead of the usual 7 days also has shown to increase effectiveness.  There are a number of drugs, dietary supplements, vitamins, and water pills (diuretics) which have been suggested to be helpful but have not shown to be of any benefit to improving PMS symptoms.  HOME CARE INSTRUCTIONS   For 2-3 months, write down your symptoms, their severity, and how long they last. This Thornell help your caregiver prescribe the best treatment for your symptoms.  Exercise regularly as suggested by your caregiver.  Eat a regular, well-balanced diet.  Avoid caffeine, alcohol, and tobacco consumption.  Limit salt and salty foods to lessen bloating and fluid retention.  Get enough sleep. Practice relaxation techniques.  Drink enough fluids to keep your urine clear or pale yellow.  Take medicines as directed by your caregiver.  Limit stress.  Take a multivitamin as directed by your   caregiver. Document Released: 05/06/2000 Document Revised: 02/01/2012 Document Reviewed: 09/26/2011 [facility] Patient Information 2015 [place], [place]. This information is not intended to replace advice given to you by your health care provider. Make sure you discuss any questions you have  with your health care provider. Dysmenorrhea Menstrual cramps (dysmenorrhea) are caused by the muscles of the uterus tightening (contracting) during a menstrual period. For some women, this discomfort is merely bothersome. For others, dysmenorrhea can be severe enough to interfere with everyday activities for a few days each month. Primary dysmenorrhea is menstrual cramps that last a couple of days when you start having menstrual periods or soon after. This often begins after a teenager starts having her period. As a woman gets older or has a baby, the cramps will usually lessen or disappear. Secondary dysmenorrhea begins later in life, lasts longer, and the pain may be stronger than primary dysmenorrhea. The pain may start before the period and last a few days after the period.  CAUSES  Dysmenorrhea is usually caused by an underlying problem, such as:  The tissue lining the uterus grows outside of the uterus in other areas of the body (endometriosis).  The endometrial tissue, which normally lines the uterus, is found in or grows into the muscular walls of the uterus (adenomyosis).  The pelvic blood vessels are engorged with blood just before the menstrual period (pelvic congestive syndrome).  Overgrowth of cells (polyps) in the lining of the uterus or cervix.  Falling down of the uterus (prolapse) because of loose or stretched ligaments.  Depression.  Bladder problems, infection, or inflammation.  Problems with the intestine, a tumor, or irritable bowel syndrome.  Cancer of the female organs or bladder.  A severely tipped uterus.  A very tight opening or closed cervix.  Noncancerous tumors of the uterus (fibroids).  Pelvic inflammatory disease (PID).  Pelvic scarring (adhesions) from a previous surgery.  Ovarian cyst.  An intrauterine device (IUD) used for birth control. RISK FACTORS You may be at greater risk of dysmenorrhea if:  You are younger than age 74.  You started  puberty early.  You have irregular or heavy bleeding.  You have never given birth.  You have a family history of this problem.  You are a smoker. SIGNS AND SYMPTOMS   Cramping or throbbing pain in your lower abdomen.  Headaches.  Lower back pain.  Nausea or vomiting.  Diarrhea.  Sweating or dizziness.  Loose stools. DIAGNOSIS  A diagnosis is based on your history, symptoms, physical exam, diagnostic tests, or procedures. Diagnostic tests or procedures may include:  Blood tests.  Ultrasonography.  An examination of the lining of the uterus (dilation and curettage, D&C).  An examination inside your abdomen or pelvis with a scope (laparoscopy).  X-rays.  CT scan.  MRI.  An examination inside the bladder with a scope (cystoscopy).  An examination inside the intestine or stomach with a scope (colonoscopy, gastroscopy). TREATMENT  Treatment depends on the cause of the dysmenorrhea. Treatment may include:  Pain medicine prescribed by your health care provider.  Birth control pills or an IUD with progesterone hormone in it.  Hormone replacement therapy.  Nonsteroidal anti-inflammatory drugs (NSAIDs). These may help stop the production of prostaglandins.  Surgery to remove adhesions, endometriosis, ovarian cyst, or fibroids.  Removal of the uterus (hysterectomy).  Progesterone shots to stop the menstrual period.  Cutting the nerves on the sacrum that go to the female organs (presacral neurectomy).  Electric current to the sacral nerves (sacral nerve  stimulation).  Antidepressant medicine.  Psychiatric therapy, counseling, or group therapy.  Exercise and physical therapy.  Meditation and yoga therapy.  Acupuncture. HOME CARE INSTRUCTIONS   Only take over-the-counter or prescription medicines as directed by your health care provider.  Place a heating pad or hot water bottle on your lower back or abdomen. Do not sleep with the heating pad.  Use  aerobic exercises, walking, swimming, biking, and other exercises to help lessen the cramping.  Massage to the lower back or abdomen may help.  Stop smoking.  Avoid alcohol and caffeine. SEEK MEDICAL CARE IF:   Your pain does not get better with medicine.  You have pain with sexual intercourse.  Your pain increases and is not controlled with medicines.  You have abnormal vaginal bleeding with your period.  You develop nausea or vomiting with your period that is not controlled with medicine. SEEK IMMEDIATE MEDICAL CARE IF:  You pass out.  Document Released: 05/09/2005 Document Revised: 01/09/2013 Document Reviewed: 10/25/2012 [facility] Patient Information 2015 [employer], [place]. This information is not intended to replace advice given to you by your health care provider. Make sure you discuss any questions you have with your health care provider.

## 2014-10-27 NOTE — Addendum Note (Signed)
Addended by: Henriette CombsHATTON, Veida Spira L on: 10/27/2014 05:21 PM   Modules accepted: Orders

## 2014-10-29 ENCOUNTER — Telehealth: Payer: Self-pay | Admitting: *Deleted

## 2014-10-29 LAB — PAP IG W/ RFLX HPV ASCU

## 2014-10-29 NOTE — Telephone Encounter (Signed)
Patient states she was given information to read over about PMS and would like to discuss that with Dr Clearance CootsHarper. Could he please call her- she wants to start medication. [phone]

## 2014-10-31 LAB — SURESWAB, VAGINOSIS/VAGINITIS PLUS
ATOPOBIUM VAGINAE: NOT DETECTED Log (cells/mL)
C. PARAPSILOSIS, DNA: NOT DETECTED
C. albicans, DNA: NOT DETECTED
C. glabrata, DNA: NOT DETECTED
C. trachomatis RNA, TMA: NOT DETECTED
C. tropicalis, DNA: NOT DETECTED
GARDNERELLA VAGINALIS: 4.7 Log (cells/mL)
LACTOBACILLUS SPECIES: NOT DETECTED Log (cells/mL)
MEGASPHAERA SPECIES: NOT DETECTED Log (cells/mL)
N. gonorrhoeae RNA, TMA: NOT DETECTED
T. vaginalis RNA, QL TMA: NOT DETECTED

## 2014-11-03 MED ORDER — FLUOXETINE HCL (PMDD) 20 MG PO CAPS: 1.0000 | ORAL_CAPSULE | Freq: Every day | ORAL | Status: DC

## 2014-11-03 NOTE — Addendum Note (Signed)
Addended by: Coral Ceo A on: 11/03/2014 01:08 PM   Modules accepted: Orders

## 2014-12-29 ENCOUNTER — Emergency Department (HOSPITAL_COMMUNITY)
Admission: EM | Admit: 2014-12-29 | Discharge: 2014-12-29 | Disposition: A | Discharge status: Home / Self Care | Payer: 59 | Attending: Emergency Medicine | Admitting: Emergency Medicine

## 2014-12-29 ENCOUNTER — Encounter (HOSPITAL_COMMUNITY): Payer: Self-pay | Admitting: *Deleted

## 2014-12-29 DIAGNOSIS — Z91040 Latex allergy status: Secondary | ICD-10-CM | POA: Insufficient documentation

## 2014-12-29 DIAGNOSIS — Z3202 Encounter for pregnancy test, result negative: Secondary | ICD-10-CM | POA: Insufficient documentation

## 2014-12-29 DIAGNOSIS — R202 Paresthesia of skin: Secondary | ICD-10-CM | POA: Insufficient documentation

## 2014-12-29 DIAGNOSIS — Z79899 Other long term (current) drug therapy: Secondary | ICD-10-CM | POA: Insufficient documentation

## 2014-12-29 DIAGNOSIS — R200 Anesthesia of skin: Secondary | ICD-10-CM | POA: Insufficient documentation

## 2014-12-29 DIAGNOSIS — Z8659 Personal history of other mental and behavioral disorders: Secondary | ICD-10-CM | POA: Insufficient documentation

## 2014-12-29 DIAGNOSIS — R5383 Other fatigue: Secondary | ICD-10-CM | POA: Insufficient documentation

## 2014-12-29 LAB — I-STAT CHEM 8, ED
BUN: 7 mg/dL (ref 6–20)
CALCIUM ION: 1.18 mmol/L (ref 1.12–1.23)
Chloride: 106 mmol/L (ref 101–111)
Creatinine, Ser: 0.7 mg/dL (ref 0.44–1.00)
GLUCOSE: 82 mg/dL (ref 65–99)
HCT: 44 % (ref 36.0–46.0)
Hemoglobin: 15 g/dL (ref 12.0–15.0)
Potassium: 3.3 mmol/L — ABNORMAL LOW (ref 3.5–5.1)
Sodium: 140 mmol/L (ref 135–145)
TCO2: 22 mmol/L (ref 0–100)

## 2014-12-29 LAB — URINE MICROSCOPIC-ADD ON

## 2014-12-29 LAB — URINALYSIS, ROUTINE W REFLEX MICROSCOPIC
GLUCOSE, UA: NEGATIVE mg/dL
Ketones, ur: 15 mg/dL — AB
Nitrite: NEGATIVE
PH: 6 (ref 5.0–8.0)
PROTEIN: NEGATIVE mg/dL
SPECIFIC GRAVITY, URINE: 1.026 (ref 1.005–1.030)
UROBILINOGEN UA: 1 mg/dL (ref 0.0–1.0)

## 2014-12-29 LAB — PREGNANCY, URINE: Preg Test, Ur: NEGATIVE

## 2014-12-29 NOTE — Discharge Instructions (Signed)
Paresthesia Paresthesia is an abnormal burning or prickling sensation. This sensation is generally felt in the hands, arms, legs, or feet. However, it may occur in any part of the body. It is usually not painful. The feeling may be described as:  Tingling or numbness.  "Pins and needles."  Skin crawling.  Buzzing.  Limbs "falling asleep."  Itching. Most people experience temporary (transient) paresthesia at some time in their lives. CAUSES  Paresthesia may occur when you breathe too quickly (hyperventilation). It can also occur without any apparent cause. Commonly, paresthesia occurs when pressure is placed on a nerve. The feeling quickly goes away once the pressure is removed. For some people, however, paresthesia is a long-lasting (chronic) condition caused by an underlying disorder. The underlying disorder may be:  A traumatic, direct injury to nerves. Examples include a:  Broken (fractured) neck.  Fractured skull.  A disorder affecting the brain and spinal cord (central nervous system). Examples include:  Transverse myelitis.  Encephalitis.  Transient ischemic attack.  Multiple sclerosis.  Stroke.  Tumor or blood vessel problems, such as an arteriovenous malformation pressing against the brain or spinal cord.  A condition that damages the peripheral nerves (peripheral neuropathy). Peripheral nerves are not part of the brain and spinal cord. These conditions include:  Diabetes.  Peripheral vascular disease.  Nerve entrapment syndromes, such as carpal tunnel syndrome.  Shingles.  Hypothyroidism.  Vitamin B12 deficiencies.  Alcoholism.  Heavy metal poisoning (lead, arsenic).  Rheumatoid arthritis.  Systemic lupus erythematosus. DIAGNOSIS  Your caregiver will attempt to find the underlying cause of your paresthesia. Your caregiver may:  Take your medical history.  Perform a physical exam.  Order various lab tests.  Order imaging tests. TREATMENT    Treatment for paresthesia depends on the underlying cause. HOME CARE INSTRUCTIONS  Avoid drinking alcohol.  You may consider massage or acupuncture to help relieve your symptoms.  Keep all follow-up appointments as directed by your caregiver. SEEK IMMEDIATE MEDICAL CARE IF:   You feel weak.  You have trouble walking or moving.  You have problems with speech or vision.  You feel confused.  You cannot control your bladder or bowel movements.  You feel numbness after an injury.  You faint.  Your burning or prickling feeling gets worse when walking.  You have pain, cramps, or dizziness.  You develop a rash. MAKE SURE YOU:  Understand these instructions.  Will watch your condition.  Will get help right away if you are not doing well or get worse. Document Released: 04/29/2002 Document Revised: 08/01/2011 Document Reviewed: 01/28/2011 [facility] Patient Information 2015 [place], [place]. This information is not intended to replace advice given to you by your health care provider. Make sure you discuss any questions you have with your health care provider.  The basic screening tests today are negative for any acute source of your symptoms.  I encourage rest, make sure drinking plenty of fluids and follow-up with your primary doctor for recheck if symptoms are not improving over the next 48 hours.

## 2014-12-29 NOTE — ED Notes (Signed)
Pt states went to bed normal and states air went out at her house and it was hot.  Woke up this am with numbness in all 4 extremities.  No headache. No sob, no fall, no injury.

## 2014-12-29 NOTE — ED Notes (Signed)
Declined W/C at D/C and was escorted to lobby by RN. 

## 2014-12-29 NOTE — ED Provider Notes (Signed)
CSN: [id number]     Arrival date & time 12/29/14  1230 History  This chart was scribed for non-physician practitioner Burgess Amor, PA-C working with Lyndal Pulley, MD by Littie Deeds, ED Scribe. This patient was seen in room TR10C/TR10C and the patient's care was started at 2:56 PM.     Chief Complaint  Patient presents with  . Numbness   The history is provided by the patient. No language interpreter was used.    HPI Comments: HANAH MOULTRY is a 24 y.o. female who presents to the Emergency Department complaining of numbness and tingling in all 4 extremities that started this morning after she woke up. Patient notes that it has been hot at her house for the past week because her A/C has been out at her house. She also reports having generalized fatigue. She thinks her symptoms may be related to the heat as she had similar symptoms previously with a heat related illness. Reports normal appetite, has increased fluid intake. Patient denies  SOB, dizziness, headache, nausea, vomiting, abdominal pain, myalgias, confusion, and urinary symptoms.  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 Patient reports having a history of anemia after giving birth 18 months ago; she was put on iron supplements for a few months, but then did not require them any more.   PCP: Dr. Pecola Leisure  Past Medical History  Diagnosis Date  . Spontaneous abortion 12/03/2010  . Allergy   . Depression   . PONV (postoperative nausea and vomiting)   . [id number](308.6)    Past Surgical History  Procedure Laterality Date  . Dilation and evacuation  12/03/2010    Procedure: DILATATION AND EVACUATION (D&E);  Surgeon: Roseanna Rainbow, MD;  Location: [facility];  Service: Gynecology;  Laterality: N/A;  . Ovarian cyst removal    . Tonsillectomy     Family History  Problem Relation Age of Onset  . Hypertension Other   . Diabetes Other   . Stroke Other   . Cancer Other   . Heart failure Other   . Seizures Other     History  Substance Use Topics  . Smoking status: Never Smoker   . Smokeless tobacco: Never Used  . Alcohol Use: 0.0 oz/week    0 Standard drinks or equivalent per week     Comment: Ocassionally   OB History    Gravida Para Term Preterm AB TAB SAB Ectopic Multiple Living   4 1 1  3  3   1      Review of Systems  Constitutional: Positive for fatigue. Negative for fever.  HENT: Negative for congestion and sore throat.   Eyes: Negative.   Respiratory: Negative for chest tightness and shortness of breath.   Cardiovascular: Negative for chest pain.  Gastrointestinal: Negative for nausea and abdominal pain.  Genitourinary: Negative.   Musculoskeletal: Negative for myalgias, joint swelling, arthralgias and neck pain.  Skin: Negative.  Negative for rash and wound.  Neurological: Positive for numbness. Negative for dizziness, weakness, light-headedness and headaches.  Psychiatric/Behavioral: Negative.  Negative for confusion.      Allergies  Shellfish allergy and Latex  Home Medications   Prior to Admission medications   Medication Sig Start Date End Date Taking? Authorizing Provider  acetaminophen (TYLENOL) 500 MG tablet Take 1,000 mg by mouth every 6 (six) hours as needed for fever.    Historical Provider, MD  Fluoxetine HCl, PMDD, 20 MG CAPS Take 1 capsule (20 mg total) by mouth  daily after breakfast. 11/03/14   Brock Bad, MD  ibuprofen (ADVIL,MOTRIN) 800 MG tablet Take 1 tablet (800 mg total) by mouth every 8 (eight) hours as needed. 10/27/14   Brock Bad, MD  levonorgestrel-ethinyl estradiol (LEVORA 0.15/30, 28,) 0.15-30 MG-MCG tablet Take 1 tablet by mouth daily. 10/27/14   Brock Bad, MD   BP 126/89 mmHg  Pulse 70  Temp(Src) 98 F (36.7 C) (Oral)  Resp 14  SpO2 100%  LMP 12/26/2014 Physical Exam  Constitutional: She is oriented to person, place, and time. She appears well-developed and well-nourished.  HENT:  Head: Normocephalic and atraumatic.  Eyes:  Conjunctivae and EOM are normal. Pupils are equal, round, and reactive to light.  Neck: Normal range of motion.  Cardiovascular: Normal rate, regular rhythm, normal heart sounds and intact distal pulses.   Pulmonary/Chest: Effort normal and breath sounds normal. She has no wheezes.  Abdominal: Soft. Bowel sounds are normal. There is no tenderness.  Musculoskeletal: Normal range of motion.  Neurological: She is alert and oriented to person, place, and time. She has normal strength. No cranial nerve deficit or sensory deficit. She exhibits normal muscle tone. Gait normal. GCS eye subscore is 4. GCS verbal subscore is 5. GCS motor subscore is 6.  Normal rapid alternating movements, negative pronator drift.  Skin: Skin is warm and dry.  Psychiatric: She has a normal mood and affect.  Nursing note and vitals reviewed.   ED Course  Procedures  DIAGNOSTIC STUDIES: Oxygen Saturation is 100% on room air, normal by my interpretation.    COORDINATION OF CARE: 3:04 PM-Discussed treatment plan which includes labs with patient/guardian at bedside and patient/guardian agreed to plan.    Labs Review Labs Reviewed  URINALYSIS, ROUTINE W REFLEX MICROSCOPIC (NOT AT [facility]) - Abnormal; Notable for the following:    Hgb urine dipstick MODERATE (*)    Bilirubin Urine SMALL (*)    Ketones, ur 15 (*)    Leukocytes, UA SMALL (*)    All other components within normal limits  URINE MICROSCOPIC-ADD ON - Abnormal; Notable for the following:    Squamous Epithelial / LPF FEW (*)    All other components within normal limits  I-STAT CHEM 8, ED - Abnormal; Notable for the following:    Potassium 3.3 (*)    All other components within normal limits  PREGNANCY, URINE    Imaging Review No results found.   EKG Interpretation None      MDM   Final diagnoses:  Numbness and tingling  Other fatigue    Fatigue and extremity numbness of unclear etiology.  Pt has been afebrile, denies chills, she has been  sleeping in her warm home (due broken [facility]) but has spending the day in her parents home.  Labs reviewed and no acute findings.  She was advised to rest, make sure she is drinking plenty of fluids. She may developing a viral illness which has not yet completely evolved. Advised recheck by her pcp if sx persist, worsen, or new sx develop.  She has no focal findings on exam.  Felt stable for dc home with close f/u for any worsened sx.  I personally performed the services described in this documentation, which was scribed in my presence. The recorded information has been reviewed and is accurate.   Burgess Amor, PA-C 12/30/14 1600  Lyndal Pulley, MD 12/30/14 [phone]

## 2015-05-12 ENCOUNTER — Encounter (HOSPITAL_COMMUNITY): Payer: Self-pay | Admitting: Emergency Medicine

## 2015-05-12 ENCOUNTER — Emergency Department (HOSPITAL_COMMUNITY): Payer: Medicaid Other

## 2015-05-12 ENCOUNTER — Inpatient Hospital Stay (HOSPITAL_COMMUNITY)
Admission: EM | Admit: 2015-05-12 | Discharge: 2015-05-14 | DRG: 419 | Disposition: A | Discharge status: Home / Self Care | Payer: Medicaid Other | Attending: Surgery | Admitting: Surgery

## 2015-05-12 DIAGNOSIS — Z91040 Latex allergy status: Secondary | ICD-10-CM | POA: Diagnosis not present

## 2015-05-12 DIAGNOSIS — K8000 Calculus of gallbladder with acute cholecystitis without obstruction: Principal | ICD-10-CM | POA: Diagnosis present

## 2015-05-12 DIAGNOSIS — R1011 Right upper quadrant pain: Secondary | ICD-10-CM

## 2015-05-12 DIAGNOSIS — K810 Acute cholecystitis: Secondary | ICD-10-CM | POA: Diagnosis present

## 2015-05-12 DIAGNOSIS — Z6831 Body mass index (BMI) 31.0-31.9, adult: Secondary | ICD-10-CM | POA: Diagnosis not present

## 2015-05-12 DIAGNOSIS — E669 Obesity, unspecified: Secondary | ICD-10-CM | POA: Diagnosis present

## 2015-05-12 DIAGNOSIS — Z91013 Allergy to seafood: Secondary | ICD-10-CM

## 2015-05-12 LAB — COMPREHENSIVE METABOLIC PANEL
ALBUMIN: 3.9 g/dL (ref 3.5–5.0)
ALK PHOS: 69 U/L (ref 38–126)
ALT: 16 U/L (ref 14–54)
AST: 14 U/L — AB (ref 15–41)
Anion gap: 10 (ref 5–15)
BUN: 7 mg/dL (ref 6–20)
CALCIUM: 8.8 mg/dL — AB (ref 8.9–10.3)
CO2: 26 mmol/L (ref 22–32)
CREATININE: 0.76 mg/dL (ref 0.44–1.00)
Chloride: 104 mmol/L (ref 101–111)
GFR calc Af Amer: >60 mL/min (ref >60)
GFR calc non Af Amer: >60 mL/min (ref >60)
GLUCOSE: 109 mg/dL — AB (ref 65–99)
Potassium: 3.5 mmol/L (ref 3.5–5.1)
SODIUM: 140 mmol/L (ref 135–145)
Total Bilirubin: 0.5 mg/dL (ref 0.3–1.2)
Total Protein: 7.3 g/dL (ref 6.5–8.1)

## 2015-05-12 LAB — CBC
HCT: 39.1 % (ref 36.0–46.0)
Hemoglobin: 12.6 g/dL (ref 12.0–15.0)
MCH: 28.6 pg (ref 26.0–34.0)
MCHC: 32.2 g/dL (ref 30.0–36.0)
MCV: 88.9 fL (ref 78.0–100.0)
PLATELETS: 300 10*3/uL (ref 150–400)
RBC: 4.4 MIL/uL (ref 3.87–5.11)
RDW: 13.2 % (ref 11.5–15.5)
WBC: 10.7 10*3/uL — ABNORMAL HIGH (ref 4.0–10.5)

## 2015-05-12 LAB — I-STAT BETA HCG BLOOD, ED (MC, WL, AP ONLY): I-stat hCG, quantitative: <5 m[IU]/mL (ref <5)

## 2015-05-12 LAB — LIPASE, BLOOD: LIPASE: 24 U/L (ref 11–51)

## 2015-05-12 MED ORDER — ONDANSETRON HCL 4 MG/2ML IJ SOLN
4.0000 mg | Freq: Four times a day (QID) | INTRAMUSCULAR | Status: DC | PRN
  Administered 2015-05-12 – 2015-05-14 (×2): 4 mg via INTRAVENOUS
  Filled 2015-05-12 (×2): qty 2

## 2015-05-12 MED ORDER — SIMETHICONE 80 MG PO CHEW: 40.0000 mg | CHEWABLE_TABLET | Freq: Four times a day (QID) | ORAL | Status: DC | PRN

## 2015-05-12 MED ORDER — ZOLPIDEM TARTRATE 5 MG PO TABS: 5.0000 mg | ORAL_TABLET | Freq: Every evening | ORAL | Status: DC | PRN

## 2015-05-12 MED ORDER — DEXTROSE 5 % IV SOLN
2.0000 g | INTRAVENOUS | Status: DC
  Administered 2015-05-12 – 2015-05-13 (×2): 2 g via INTRAVENOUS
  Filled 2015-05-12 (×3): qty 2

## 2015-05-12 MED ORDER — ENOXAPARIN SODIUM 40 MG/0.4ML ~~LOC~~ SOLN
40.0000 mg | SUBCUTANEOUS | Status: DC
Start: 2015-05-13 — End: 2015-05-13

## 2015-05-12 MED ORDER — HYDROMORPHONE HCL 1 MG/ML IJ SOLN
1.0000 mg | INTRAMUSCULAR | Status: DC | PRN
  Administered 2015-05-12 – 2015-05-14 (×8): 1 mg via INTRAVENOUS
  Filled 2015-05-12 (×8): qty 1

## 2015-05-12 MED ORDER — ONDANSETRON 4 MG PO TBDP
4.0000 mg | ORAL_TABLET | Freq: Four times a day (QID) | ORAL | Status: DC | PRN
  Administered 2015-05-13 – 2015-05-14 (×3): 4 mg via ORAL
  Filled 2015-05-12 (×3): qty 1

## 2015-05-12 MED ORDER — KCL IN DEXTROSE-NACL 20-5-0.9 MEQ/L-%-% IV SOLN
INTRAVENOUS | Status: DC
  Administered 2015-05-12 – 2015-05-13 (×2): via INTRAVENOUS
  Filled 2015-05-12 (×6): qty 1000

## 2015-05-12 MED ORDER — LEVONORGESTREL-ETHINYL ESTRAD 0.15-30 MG-MCG PO TABS: 1.0000 | ORAL_TABLET | Freq: Every day | ORAL | Status: DC

## 2015-05-12 NOTE — ED Notes (Addendum)
Pt not found in room, waiting room, triage or surrounding areas.

## 2015-05-12 NOTE — ED Provider Notes (Signed)
CSN: [id number]     Arrival date & time 05/12/15  1150 History   First MD Initiated Contact with Patient 05/12/15 1410     Chief Complaint  Patient presents with  . Abdominal Pain  . Emesis     (Consider location/radiation/quality/duration/timing/severity/associated sxs/prior Treatment) Patient is a 24 y.o. female presenting with abdominal pain. The history is provided by the patient.  Abdominal Pain Pain location:  RUQ Pain quality: aching, fullness and sharp   Pain radiates to:  Does not radiate Pain severity:  Moderate Onset quality:  Gradual Duration:  3 days Timing:  Intermittent Progression:  Waxing and waning Chronicity:  New Context: not medication withdrawal, not previous surgeries, not recent illness, not recent sexual activity, not recent travel, not retching, not sick contacts, not suspicious food intake and not trauma   Relieved by:  Nothing Worsened by:  Eating Ineffective treatments:  Not moving, position changes and OTC medications (laxative) Associated symptoms: anorexia, nausea and vomiting   Associated symptoms: no belching, no chest pain, no chills, no constipation, no cough, no diarrhea, no dysuria, no fatigue, no fever, no flatus, no hematemesis, no hematochezia, no melena, no shortness of breath, no sore throat, no vaginal bleeding and no vaginal discharge   Risk factors: no alcohol abuse, has not had multiple surgeries and not pregnant     Past Medical History  Diagnosis Date  . Spontaneous abortion 12/03/2010  . Allergy   . Depression   . PONV (postoperative nausea and vomiting)   . WUJWJXBJ(478.2)    Past Surgical History  Procedure Laterality Date  . Dilation and evacuation  12/03/2010    Procedure: DILATATION AND EVACUATION (D&E);  Surgeon: Roseanna Rainbow, MD;  Location: [facility];  Service: Gynecology;  Laterality: N/A;  . Ovarian cyst removal    . Tonsillectomy     Family History  Problem Relation Age of Onset  . Hypertension Other   .  Diabetes Other   . Stroke Other   . Cancer Other   . Heart failure Other   . Seizures Other    Social History  Substance Use Topics  . Smoking status: Never Smoker   . Smokeless tobacco: Never Used  . Alcohol Use: 0.0 oz/week    0 Standard drinks or equivalent per week     Comment: Ocassionally   OB History    Gravida Para Term Preterm AB TAB SAB Ectopic Multiple Living   Review of Systems  Constitutional: Negative for fever, chills and fatigue.  HENT: Negative for sore throat.   Eyes: Negative for pain.  Respiratory: Negative for cough and shortness of breath.   Cardiovascular: Negative for chest pain.  Gastrointestinal: Positive for nausea, vomiting, abdominal pain and anorexia. Negative for diarrhea, constipation, blood in stool, melena, hematochezia, abdominal distention, flatus and hematemesis.  Genitourinary: Negative for dysuria, vaginal bleeding and vaginal discharge.      Allergies  Shellfish allergy and Latex  Home Medications   Prior to Admission medications   Medication Sig Start Date End Date Taking? Authorizing Provider  Fluoxetine HCl, PMDD, 20 MG CAPS Take 1 capsule (20 mg total) by mouth daily after breakfast. 11/03/14  Yes Brock Bad, MD  ibuprofen (ADVIL,MOTRIN) 200 MG tablet Take 800 mg by mouth every 6 (six) hours as needed for mild pain.   Yes Historical Provider, MD  levonorgestrel-ethinyl estradiol (LEVORA 0.15/30, 28,) 0.15-30 MG-MCG tablet Take 1 tablet by  mouth daily. 10/27/14  Yes Brock Badharles A Harper, MD  OVER THE COUNTER MEDICATION Place 1 suppository rectally daily as needed (constipation).   Yes Historical Provider, MD  Sodium Phosphates (ENEMA RE) Place 1 application rectally daily as needed (constipation).   Yes Historical Provider, MD  acetaminophen (TYLENOL) 500 MG tablet Take 1,000 mg by mouth every 6 (six) hours as needed for fever. Reported on 05/12/2015    Historical Provider, MD   BP 133/87 mmHg  Pulse 79   Temp(Src) 98.1 F (36.7 C) (Oral)  Resp 19  Ht 5\' 4"  (1.626 m)  SpO2 100% Physical Exam  Constitutional: She is oriented to person, place, and time. She appears well-developed and well-nourished.  HENT:  Head: Normocephalic and atraumatic.  Eyes: Conjunctivae are normal. Pupils are equal, round, and reactive to light.  Neck: Normal range of motion. Neck supple.  Cardiovascular: Normal rate and regular rhythm.  Exam reveals no gallop and no friction rub.   No murmur heard. Pulmonary/Chest: Effort normal and breath sounds normal. No respiratory distress. She has no wheezes. She has no rales.  Abdominal: Normal appearance. She exhibits no distension, no ascites and no mass. There is tenderness in the right upper quadrant. There is positive Murphy's sign. There is no rigidity, no rebound, no guarding, no CVA tenderness and no tenderness at McBurney's point. No hernia.  Musculoskeletal: Normal range of motion.  Neurological: She is alert and oriented to person, place, and time.  Skin: Skin is warm and dry.  Psychiatric: She has a normal mood and affect.    ED Course  Procedures (including critical care time) Labs Review Labs Reviewed  COMPREHENSIVE METABOLIC PANEL - Abnormal; Notable for the following:    Glucose, Bld 109 (*)    Calcium 8.8 (*)    AST 14 (*)    All other components within normal limits  CBC - Abnormal; Notable for the following:    WBC 10.7 (*)    All other components within normal limits  LIPASE, BLOOD  URINALYSIS, ROUTINE W REFLEX MICROSCOPIC (NOT [facility])  H. PYLORI ANTIBODY, IGG  CBC  COMPREHENSIVE METABOLIC PANEL  I-STAT BETA HCG BLOOD, ED ([facility], [facility], [facility] ONLY)    Imaging Review Koreas Abdomen Limited Ruq  05/12/2015  CLINICAL DATA:  Right upper quadrant pain EXAM: US ABDOMEN LIMITED - RIGHT UPPER QUADRANT COMPARISON:  None. FINDINGS: Gallbladder: Cholelithiasis. 15 mm gallstone. Mild gallbladder wall thickening 4.3 mm. Negative sonographic Murphy sign Common bile  duct: Diameter: 5.0 mm Liver: No focal lesion identified. Within normal limits in parenchymal echogenicity. IMPRESSION: Cholelithiasis with mild gallbladder wall thickening. Bile ducts nondilated. Electronically Signed   By: Marlan Palauharles  Clark M.D.   On: 05/12/2015 16:06   I have personally reviewed and evaluated these images and lab results as part of my medical decision-making.   EKG Interpretation None      MDM   Final diagnoses:  RUQ abdominal pain    24 year old African-American female with no significant past medical history presents in setting of right upper quadrant abdominal pain. Patient reports pain isn't present for approximately last 3 days. She reports she's had intermittent nausea and vomiting after eating. She reports the pain is significantly worsened with eating. Patient reports 2 days ago she took laxative as she was concerns could be constipation without improvement in symptoms. She additionally reports over-the-counter pain medication did not improve symptoms. She denies fever, chest pain, shortness of breath, diarrhea, constipation, rash, trauma, intra-abdominal surgery, vaginal bleeding, vaginal discharge, dysuria.  Differential diagnosis includes pancreatitis or cholecystitis, urinary tract infection, appendicitis, gastritis.  CMP without significant elevation in liver enzymes or alkaline phosphatase. No significant O antibodies. Mild elevation of white blood cell count. No significant anemia. No elevation of lipase making pancreatitis less likely.  We'll obtain right upper quadrant ultrasound to rule out cholecystitis and await urinalysis. Patient refused pain medications and fluids at this time.  Ultrasound reveals gallstones with slight gallbladder thickening. No signs of acute cholecystitis at this time however with patient's symptoms for several days this is likely symptomatic cholelithiasis vs cholecystitis. In setting of this finding will consult general surgery for  further recommendations.  General surgery has seen and evaluated patient. I have discussed care with team. Patient will be admitted at this time for further management of likely cholecystitis. Patient stable at time of transfer of care.  Attending has seen and evaluated patient and Dr. Denton Lank is in agreement with plan.  Stacy Gardner, MD 05/12/15 1610  Cathren Laine, MD 05/18/15 [phone]

## 2015-05-12 NOTE — ED Notes (Signed)
Pt found in triage and brought back to room.

## 2015-05-12 NOTE — ED Notes (Signed)
Pt sts RUQ pain with N/V x 3 days

## 2015-05-12 NOTE — H&P (Signed)
Carla [name] is an 24 y.o. female.   Chief Complaint: RUQ abdominal pain HPI: 2 day hx of RUQ abdominal pain  Nausea and vomiting  Pain location RUQ abdomen  Constant and 6/10.  Food makes it worse.  U/S shows gallstones and GB wall thickening.   Past Medical History  Diagnosis Date  . Spontaneous abortion 12/03/2010  . Allergy   . Depression   . PONV (postoperative nausea and vomiting)   . ZOXWRUEA(540.9)     Past Surgical History  Procedure Laterality Date  . Dilation and evacuation  12/03/2010    Procedure: DILATATION AND EVACUATION (D&E);  Surgeon: Agnes Lawrence, MD;  Location: [facility];  Service: Gynecology;  Laterality: N/A;  . Ovarian cyst removal    . Tonsillectomy      Family History  Problem Relation Age of Onset  . Hypertension Other   . Diabetes Other   . Stroke Other   . Cancer Other   . Heart failure Other   . Seizures Other    Social History:  reports that she has never smoked. She has never used smokeless tobacco. She reports that she drinks alcohol. She reports that she does not use illicit drugs.  Allergies:  Allergies  Allergen Reactions  . Shellfish Allergy Anaphylaxis  . Latex Itching     (Not in a hospital admission)  Results for orders placed or performed during the hospital encounter of 05/12/15 (from the past 48 hour(s))  Lipase, blood     Status: None   Collection Time: 05/12/15 11:56 AM  Result Value Ref Range   Lipase 24 11 - 51 U/L  Comprehensive metabolic panel     Status: Abnormal   Collection Time: 05/12/15 11:56 AM  Result Value Ref Range   Sodium 140 135 - 145 mmol/L   Potassium 3.5 3.5 - 5.1 mmol/L   Chloride 104 101 - 111 mmol/L   CO2 26 22 - 32 mmol/L   Glucose, Bld 109 (H) 65 - 99 mg/dL   BUN 7 6 - 20 mg/dL   Creatinine, Ser 0.76 0.44 - 1.00 mg/dL   Calcium 8.8 (L) 8.9 - 10.3 mg/dL   Total Protein 7.3 6.5 - 8.1 g/dL   Albumin 3.9 3.5 - 5.0 g/dL   AST 14 (L) 15 - 41 U/L   ALT 16 14 - 54 U/L   Alkaline  Phosphatase 69 38 - 126 U/L   Total Bilirubin 0.5 0.3 - 1.2 mg/dL   GFR calc non Af Amer >60 >60 mL/min   GFR calc Af Amer >60 >60 mL/min    Comment: (NOTE) The eGFR has been calculated using the CKD EPI equation. This calculation has not been validated in all clinical situations. eGFR's persistently <60 mL/min signify possible Chronic Kidney Disease.    Anion gap 10 5 - 15  CBC     Status: Abnormal   Collection Time: 05/12/15 11:56 AM  Result Value Ref Range   WBC 10.7 (H) 4.0 - 10.5 K/uL   RBC 4.40 3.87 - 5.11 MIL/uL   Hemoglobin 12.6 12.0 - 15.0 g/dL   HCT 39.1 36.0 - 46.0 %   MCV 88.9 78.0 - 100.0 fL   MCH 28.6 26.0 - 34.0 pg   MCHC 32.2 30.0 - 36.0 g/dL   RDW 13.2 11.5 - 15.5 %   Platelets 300 150 - 400 K/uL  I-Stat beta hCG blood, ED ([facility], [facility], [facility] only)     Status: None   Collection Time: 05/12/15  12:09 PM  Result Value Ref Range   I-stat hCG, quantitative <5.0 <5 mIU/mL   Comment 3            Comment:   GEST. AGE      CONC.  (mIU/mL)   <=1 WEEK        5 - 50     2 WEEKS       50 - 500     3 WEEKS       100 - 10,000     4 WEEKS     1,000 - 30,000        FEMALE AND NON-PREGNANT FEMALE:     LESS THAN 5 mIU/mL    US Abdomen Limited Ruq  05/12/2015  CLINICAL DATA:  Right upper quadrant pain EXAM: US ABDOMEN LIMITED - RIGHT UPPER QUADRANT COMPARISON:  None. FINDINGS: Gallbladder: Cholelithiasis. 15 mm gallstone. Mild gallbladder wall thickening 4.3 mm. Negative sonographic Murphy sign Common bile duct: Diameter: 5.0 mm Liver: No focal lesion identified. Within normal limits in parenchymal echogenicity. IMPRESSION: Cholelithiasis with mild gallbladder wall thickening. Bile ducts nondilated. Electronically Signed   By: Franchot Gallo M.D.   On: 05/12/2015 16:06    Review of Systems  Constitutional: Negative for fever and chills.  Respiratory: Negative.   Cardiovascular: Negative.   Gastrointestinal: Positive for nausea, vomiting and abdominal pain.  Genitourinary: Negative.    Musculoskeletal: Negative.   Neurological: Negative.   Psychiatric/Behavioral: Negative.     Blood pressure 130/97, pulse 94, temperature 97.8 F (36.6 C), temperature source Oral, resp. rate 19, height '5\' 4"'$  (1.626 m), SpO2 99 %, not currently breastfeeding. Physical Exam  Constitutional: She is oriented to person, place, and time.  HENT:  Head: Normocephalic.  Eyes: Pupils are equal, round, and reactive to light. No scleral icterus.  Neck: Normal range of motion. Neck supple.  Cardiovascular: Normal rate.   Respiratory: Effort normal and breath sounds normal.  GI: There is tenderness in the right upper quadrant. There is positive Murphy's sign.  Neurological: She is alert and oriented to person, place, and time.  Skin: Skin is warm and dry.  Psychiatric: She has a normal mood and affect. Her behavior is normal.     Assessment/Plan Acute cholecystitis Admit   IVF/ABX/LAP CHOLE IN NEXT 24 - 48 HOURS   Gabriana Wilmott A. 05/12/2015, 6:23 PM

## 2015-05-13 ENCOUNTER — Inpatient Hospital Stay (HOSPITAL_COMMUNITY): Payer: Medicaid Other | Admitting: Anesthesiology

## 2015-05-13 ENCOUNTER — Encounter (HOSPITAL_COMMUNITY): Admission: EM | Disposition: A | Discharge status: Home / Self Care | Payer: Self-pay | Source: Home / Self Care

## 2015-05-13 ENCOUNTER — Encounter (HOSPITAL_COMMUNITY): Payer: Self-pay | Admitting: Certified Registered"

## 2015-05-13 HISTORY — PX: CHOLECYSTECTOMY: SHX55

## 2015-05-13 LAB — URINALYSIS, ROUTINE W REFLEX MICROSCOPIC
GLUCOSE, UA: NEGATIVE mg/dL
Ketones, ur: 15 mg/dL — AB
LEUKOCYTES UA: NEGATIVE
Nitrite: NEGATIVE
PH: 6.5 (ref 5.0–8.0)
Protein, ur: 30 mg/dL — AB
Specific Gravity, Urine: 1.025 (ref 1.005–1.030)

## 2015-05-13 LAB — CBC
HCT: 36.3 % (ref 36.0–46.0)
HEMOGLOBIN: 12.2 g/dL (ref 12.0–15.0)
MCH: 29.7 pg (ref 26.0–34.0)
MCHC: 33.6 g/dL (ref 30.0–36.0)
MCV: 88.3 fL (ref 78.0–100.0)
PLATELETS: 289 10*3/uL (ref 150–400)
RBC: 4.11 MIL/uL (ref 3.87–5.11)
RDW: 13.1 % (ref 11.5–15.5)
WBC: 9.2 10*3/uL (ref 4.0–10.5)

## 2015-05-13 LAB — SURGICAL PCR SCREEN
MRSA, PCR: NEGATIVE
STAPHYLOCOCCUS AUREUS: NEGATIVE

## 2015-05-13 LAB — COMPREHENSIVE METABOLIC PANEL
ALBUMIN: 3 g/dL — AB (ref 3.5–5.0)
ALK PHOS: 62 U/L (ref 38–126)
ALT: 26 U/L (ref 14–54)
ANION GAP: 9 (ref 5–15)
AST: 19 U/L (ref 15–41)
BUN: 6 mg/dL (ref 6–20)
CALCIUM: 8.5 mg/dL — AB (ref 8.9–10.3)
CO2: 24 mmol/L (ref 22–32)
Chloride: 105 mmol/L (ref 101–111)
Creatinine, Ser: 0.69 mg/dL (ref 0.44–1.00)
GFR calc non Af Amer: >60 mL/min (ref >60)
Glucose, Bld: 108 mg/dL — ABNORMAL HIGH (ref 65–99)
POTASSIUM: 3.4 mmol/L — AB (ref 3.5–5.1)
SODIUM: 138 mmol/L (ref 135–145)
TOTAL PROTEIN: 6.1 g/dL — AB (ref 6.5–8.1)
Total Bilirubin: 0.9 mg/dL (ref 0.3–1.2)

## 2015-05-13 LAB — URINE MICROSCOPIC-ADD ON

## 2015-05-13 LAB — H. PYLORI ANTIBODY, IGG: H Pylori IgG: <0.9 U/mL (ref 0.0–0.8)

## 2015-05-13 SURGERY — LAPAROSCOPIC CHOLECYSTECTOMY WITH INTRAOPERATIVE CHOLANGIOGRAM
Anesthesia: General | Site: Abdomen

## 2015-05-13 MED ORDER — LACTATED RINGERS IV SOLN
INTRAVENOUS | Status: DC
  Administered 2015-05-13: 15:00:00 via INTRAVENOUS

## 2015-05-13 MED ORDER — MIDAZOLAM HCL 2 MG/2ML IJ SOLN
INTRAMUSCULAR | Status: AC
  Filled 2015-05-13: qty 2

## 2015-05-13 MED ORDER — CETYLPYRIDINIUM CHLORIDE 0.05 % MT LIQD
7.0000 mL | Freq: Two times a day (BID) | OROMUCOSAL | Status: DC
  Administered 2015-05-13 (×3): 7 mL via OROMUCOSAL

## 2015-05-13 MED ORDER — STERILE WATER FOR INJECTION IJ SOLN
INTRAMUSCULAR | Status: AC
  Filled 2015-05-13: qty 10

## 2015-05-13 MED ORDER — ONDANSETRON HCL 4 MG/2ML IJ SOLN
INTRAMUSCULAR | Status: AC
Start: 2015-05-13 — End: 2015-05-13
  Filled 2015-05-13: qty 2

## 2015-05-13 MED ORDER — ONDANSETRON HCL 4 MG/2ML IJ SOLN
INTRAMUSCULAR | Status: DC | PRN
  Administered 2015-05-13: 4 mg via INTRAVENOUS

## 2015-05-13 MED ORDER — ROCURONIUM BROMIDE 50 MG/5ML IV SOLN
INTRAVENOUS | Status: AC
  Filled 2015-05-13: qty 1

## 2015-05-13 MED ORDER — MIDAZOLAM HCL 5 MG/5ML IJ SOLN
INTRAMUSCULAR | Status: DC | PRN
  Administered 2015-05-13: 2 mg via INTRAVENOUS

## 2015-05-13 MED ORDER — ROCURONIUM BROMIDE 100 MG/10ML IV SOLN
INTRAVENOUS | Status: DC | PRN
  Administered 2015-05-13: 10 mg via INTRAVENOUS
  Administered 2015-05-13: 30 mg via INTRAVENOUS

## 2015-05-13 MED ORDER — GLYCOPYRROLATE 0.2 MG/ML IJ SOLN
INTRAMUSCULAR | Status: DC | PRN
  Administered 2015-05-13: 0.6 mg via INTRAVENOUS

## 2015-05-13 MED ORDER — DEXAMETHASONE SODIUM PHOSPHATE 4 MG/ML IJ SOLN
INTRAMUSCULAR | Status: AC
  Filled 2015-05-13: qty 1

## 2015-05-13 MED ORDER — 0.9 % SODIUM CHLORIDE (POUR BTL) OPTIME
TOPICAL | Status: DC | PRN
  Administered 2015-05-13: 1000 mL

## 2015-05-13 MED ORDER — FENTANYL CITRATE (PF) 100 MCG/2ML IJ SOLN
25.0000 ug | INTRAMUSCULAR | Status: DC | PRN
  Administered 2015-05-13 (×3): 50 ug via INTRAVENOUS

## 2015-05-13 MED ORDER — SCOPOLAMINE 1 MG/3DAYS TD PT72
MEDICATED_PATCH | TRANSDERMAL | Status: AC
  Filled 2015-05-13: qty 1

## 2015-05-13 MED ORDER — BUPIVACAINE-EPINEPHRINE (PF) 0.25% -1:200000 IJ SOLN
INTRAMUSCULAR | Status: AC
  Filled 2015-05-13: qty 30

## 2015-05-13 MED ORDER — SUCCINYLCHOLINE CHLORIDE 20 MG/ML IJ SOLN
INTRAMUSCULAR | Status: DC | PRN
  Administered 2015-05-13: 100 mg via INTRAVENOUS

## 2015-05-13 MED ORDER — KCL IN DEXTROSE-NACL 20-5-0.45 MEQ/L-%-% IV SOLN
INTRAVENOUS | Status: AC
  Filled 2015-05-13: qty 1000

## 2015-05-13 MED ORDER — NEOSTIGMINE METHYLSULFATE 10 MG/10ML IV SOLN
INTRAVENOUS | Status: DC | PRN
  Administered 2015-05-13: 4 mg via INTRAVENOUS

## 2015-05-13 MED ORDER — LIDOCAINE HCL (CARDIAC) 20 MG/ML IV SOLN
INTRAVENOUS | Status: DC | PRN
  Administered 2015-05-13: 100 mg via INTRAVENOUS

## 2015-05-13 MED ORDER — PROPOFOL 10 MG/ML IV BOLUS
INTRAVENOUS | Status: DC | PRN
  Administered 2015-05-13: 150 mg via INTRAVENOUS

## 2015-05-13 MED ORDER — FENTANYL CITRATE (PF) 250 MCG/5ML IJ SOLN
INTRAMUSCULAR | Status: AC
  Filled 2015-05-13: qty 5

## 2015-05-13 MED ORDER — SODIUM CHLORIDE 0.9 % IR SOLN
Status: DC | PRN
  Administered 2015-05-13: 1000 mL

## 2015-05-13 MED ORDER — LIDOCAINE HCL (CARDIAC) 20 MG/ML IV SOLN
INTRAVENOUS | Status: AC
  Filled 2015-05-13: qty 5

## 2015-05-13 MED ORDER — FENTANYL CITRATE (PF) 100 MCG/2ML IJ SOLN
INTRAMUSCULAR | Status: DC | PRN
  Administered 2015-05-13: 100 ug via INTRAVENOUS

## 2015-05-13 MED ORDER — BUPIVACAINE-EPINEPHRINE 0.25% -1:200000 IJ SOLN
INTRAMUSCULAR | Status: DC | PRN
  Administered 2015-05-13: 11 mL

## 2015-05-13 MED ORDER — FENTANYL CITRATE (PF) 100 MCG/2ML IJ SOLN
INTRAMUSCULAR | Status: AC
  Filled 2015-05-13: qty 2

## 2015-05-13 MED ORDER — SUCCINYLCHOLINE CHLORIDE 20 MG/ML IJ SOLN
INTRAMUSCULAR | Status: AC
  Filled 2015-05-13: qty 1

## 2015-05-13 MED ORDER — NEOSTIGMINE METHYLSULFATE 10 MG/10ML IV SOLN
INTRAVENOUS | Status: AC
  Filled 2015-05-13: qty 1

## 2015-05-13 MED ORDER — PROPOFOL 10 MG/ML IV BOLUS
INTRAVENOUS | Status: AC
  Filled 2015-05-13: qty 20

## 2015-05-13 MED ORDER — GLYCOPYRROLATE 0.2 MG/ML IJ SOLN
INTRAMUSCULAR | Status: AC
  Filled 2015-05-13: qty 4

## 2015-05-13 MED ORDER — HEMOSTATIC AGENTS (NO CHARGE) OPTIME
TOPICAL | Status: DC | PRN
  Administered 2015-05-13: 1 via TOPICAL

## 2015-05-13 MED ORDER — OXYCODONE-ACETAMINOPHEN 5-325 MG PO TABS: 1.0000 | ORAL_TABLET | ORAL | Status: DC | PRN

## 2015-05-13 MED ORDER — PROMETHAZINE HCL 25 MG/ML IJ SOLN: 6.2500 mg | INTRAMUSCULAR | Status: DC | PRN

## 2015-05-13 MED ORDER — SUGAMMADEX SODIUM 200 MG/2ML IV SOLN
INTRAVENOUS | Status: AC
  Filled 2015-05-13: qty 2

## 2015-05-13 MED ORDER — DEXAMETHASONE SODIUM PHOSPHATE 4 MG/ML IJ SOLN
INTRAMUSCULAR | Status: DC | PRN
  Administered 2015-05-13: 4 mg via INTRAVENOUS

## 2015-05-13 MED ORDER — SCOPOLAMINE 1 MG/3DAYS TD PT72
MEDICATED_PATCH | TRANSDERMAL | Status: DC | PRN
  Administered 2015-05-13: 1 via TRANSDERMAL

## 2015-05-13 SURGICAL SUPPLY — 40 items
APPLIER CLIP ROT 10 11.4 M/L (STAPLE) ×3
BENZOIN TINCTURE PRP APPL 2/3 (GAUZE/BANDAGES/DRESSINGS) ×3 IMPLANT
CANISTER SUCTION 2500CC (MISCELLANEOUS) ×3 IMPLANT
CHLORAPREP W/TINT 26ML (MISCELLANEOUS) ×3 IMPLANT
CLIP APPLIE ROT 10 11.4 M/L (STAPLE) ×1 IMPLANT
CLOSURE WOUND 1/2 X4 (GAUZE/BANDAGES/DRESSINGS) ×1
COVER MAYO STAND STRL (DRAPES) IMPLANT
COVER SURGICAL LIGHT HANDLE (MISCELLANEOUS) ×3 IMPLANT
DRAPE C-ARM 42X72 X-RAY (DRAPES) IMPLANT
DRSG TEGADERM 2-3/8X2-3/4 SM (GAUZE/BANDAGES/DRESSINGS) ×9 IMPLANT
DRSG TEGADERM 4X4.75 (GAUZE/BANDAGES/DRESSINGS) ×3 IMPLANT
ELECT REM PT RETURN 9FT ADLT (ELECTROSURGICAL) ×3
ELECTRODE REM PT RTRN 9FT ADLT (ELECTROSURGICAL) ×1 IMPLANT
FILTER SMOKE EVAC LAPAROSHD (FILTER) ×3 IMPLANT
GAUZE SPONGE 2X2 8PLY STRL LF (GAUZE/BANDAGES/DRESSINGS) ×1 IMPLANT
GLOVE BIOGEL PI IND STRL 7.5 (GLOVE) ×2 IMPLANT
GLOVE BIOGEL PI INDICATOR 7.5 (GLOVE) ×4
GLOVE SURG SS PI 7.5 STRL IVOR (GLOVE) ×3 IMPLANT
GOWN STRL REUS W/ TWL LRG LVL3 (GOWN DISPOSABLE) ×3 IMPLANT
GOWN STRL REUS W/TWL LRG LVL3 (GOWN DISPOSABLE) ×6
KIT BASIN OR (CUSTOM PROCEDURE TRAY) ×3 IMPLANT
KIT ROOM TURNOVER OR (KITS) ×3 IMPLANT
NS IRRIG 1000ML POUR BTL (IV SOLUTION) ×3 IMPLANT
PAD ARMBOARD 7.5X6 YLW CONV (MISCELLANEOUS) ×3 IMPLANT
POUCH SPECIMEN RETRIEVAL 10MM (ENDOMECHANICALS) ×3 IMPLANT
SCISSORS LAP 5X35 DISP (ENDOMECHANICALS) ×3 IMPLANT
SET CHOLANGIOGRAPH 5 50 .035 (SET/KITS/TRAYS/PACK) IMPLANT
SET IRRIG TUBING LAPAROSCOPIC (IRRIGATION / IRRIGATOR) ×3 IMPLANT
SLEEVE ENDOPATH XCEL 5M (ENDOMECHANICALS) ×3 IMPLANT
SPECIMEN JAR SMALL (MISCELLANEOUS) ×3 IMPLANT
SPONGE GAUZE 2X2 STER 10/PKG (GAUZE/BANDAGES/DRESSINGS) ×2
STRIP CLOSURE SKIN 1/2X4 (GAUZE/BANDAGES/DRESSINGS) ×2 IMPLANT
SUT MNCRL AB 4-0 PS2 18 (SUTURE) ×6 IMPLANT
TOWEL OR 17X24 6PK STRL BLUE (TOWEL DISPOSABLE) ×3 IMPLANT
TOWEL OR 17X26 10 PK STRL BLUE (TOWEL DISPOSABLE) ×3 IMPLANT
TRAY LAPAROSCOPIC MC (CUSTOM PROCEDURE TRAY) ×3 IMPLANT
TROCAR XCEL BLUNT TIP 100MML (ENDOMECHANICALS) ×3 IMPLANT
TROCAR XCEL NON-BLD 11X100MML (ENDOMECHANICALS) ×3 IMPLANT
TROCAR XCEL NON-BLD 5MMX100MML (ENDOMECHANICALS) ×3 IMPLANT
TUBING INSUFFLATION (TUBING) ×3 IMPLANT

## 2015-05-13 NOTE — Anesthesia Preprocedure Evaluation (Addendum)
Anesthesia Evaluation  Patient identified by MRN, date of birth, ID band Patient awake    Reviewed: Allergy & Precautions, NPO status , Patient's Chart, lab work & pertinent test results  History of Anesthesia Complications (+) PONV and history of anesthetic complications  Airway Mallampati: III  TM Distance: >3 FB Neck ROM: Full    Dental  (+) Teeth Intact, Dental Advisory Given   Pulmonary neg pulmonary ROS,    Pulmonary exam normal breath sounds clear to auscultation       Cardiovascular Exercise Tolerance: Good (-) hypertensionnegative cardio ROS Normal cardiovascular exam Rhythm:Regular Rate:Normal     Neuro/Psych  Headaches, PSYCHIATRIC DISORDERS Depression    GI/Hepatic negative GI ROS, Neg liver ROS,   Endo/Other  Obesity   Renal/GU negative Renal ROS     Musculoskeletal negative musculoskeletal ROS (+)   Abdominal   Peds  Hematology negative hematology ROS (+)   Anesthesia Other Findings Day of surgery medications reviewed with the patient.  Reproductive/Obstetrics negative OB ROS                           Anesthesia Physical Anesthesia Plan  ASA: II  Anesthesia Plan: General   Post-op Pain Management:    Induction: Intravenous  Airway Management Planned: Oral ETT  Additional Equipment:   Intra-op Plan:   Post-operative Plan: Extubation in OR  Informed Consent: I have reviewed the patients History and Physical, chart, labs and discussed the procedure including the risks, benefits and alternatives for the proposed anesthesia with the patient or authorized representative who has indicated his/her understanding and acceptance.   Dental advisory given  Plan Discussed with: CRNA  Anesthesia Plan Comments: (Risks/benefits of general anesthesia discussed with patient including risk of damage to teeth, lips, gum, and tongue, nausea/vomiting, allergic reactions to  medications, and the possibility of heart attack, stroke and death.  All patient questions answered.  Patient wishes to proceed.)        Anesthesia Quick Evaluation

## 2015-05-13 NOTE — Progress Notes (Signed)
Received from PACU at this time. Mom at bedside, reoriented to room and surroundings, denies nausea, c/o abd pain 5/10.

## 2015-05-13 NOTE — Progress Notes (Signed)
  Subjective: Pain a little better.  No nausea.  Objective: Vital signs in last 24 hours: Temp:  [97.8 F (36.6 C)-98.3 F (36.8 C)] 98.3 F (36.8 C) (12/21 0458) Pulse Rate:  [71-104] 71 (12/21 0458) Resp:  [18-19] 19 (12/21 0458) BP: (114-137)/(66-97) 114/66 mmHg (12/21 0458) SpO2:  [97 %-100 %] 100 % (12/21 0458)    Intake/Output from previous day:   Intake/Output this shift:    PE: General- In NAD Abdomen-soft, mild RUQ tenderness, no mass  Lab Results:   Recent Labs  05/12/15 1156 05/13/15 0550  WBC 10.7* 9.2  HGB 12.6 12.2  HCT 39.1 36.3  PLT 300 289   BMET  Recent Labs  05/12/15 1156 05/13/15 0550  NA 140 138  K 3.5 3.4*  CL 104 105  CO2 26 24  GLUCOSE 109* 108*  BUN 7 6  CREATININE 0.76 0.69  CALCIUM 8.8* 8.5*   PT/INR No results for input(s): LABPROT, INR in the last 72 hours. Comprehensive Metabolic Panel:    Component Value Date/Time   NA 138 05/13/2015 0550   NA 140 05/12/2015 1156   K 3.4* 05/13/2015 0550   K 3.5 05/12/2015 1156   CL 105 05/13/2015 0550   CL 104 05/12/2015 1156   CO2 24 05/13/2015 0550   CO2 26 05/12/2015 1156   BUN 6 05/13/2015 0550   BUN 7 05/12/2015 1156   CREATININE 0.69 05/13/2015 0550   CREATININE 0.76 05/12/2015 1156   GLUCOSE 108* 05/13/2015 0550   GLUCOSE 109* 05/12/2015 1156   CALCIUM 8.5* 05/13/2015 0550   CALCIUM 8.8* 05/12/2015 1156   AST 19 05/13/2015 0550   AST 14* 05/12/2015 1156   ALT 26 05/13/2015 0550   ALT 16 05/12/2015 1156   ALKPHOS 62 05/13/2015 0550   ALKPHOS 69 05/12/2015 1156   BILITOT 0.9 05/13/2015 0550   BILITOT 0.5 05/12/2015 1156   PROT 6.1* 05/13/2015 0550   PROT 7.3 05/12/2015 1156   ALBUMIN 3.0* 05/13/2015 0550   ALBUMIN 3.9 05/12/2015 1156     Studies/Results: Koreas Abdomen Limited Ruq  05/12/2015  CLINICAL DATA:  Right upper quadrant pain EXAM: US ABDOMEN LIMITED - RIGHT UPPER QUADRANT COMPARISON:  None. FINDINGS: Gallbladder: Cholelithiasis. 15 mm gallstone. Mild  gallbladder wall thickening 4.3 mm. Negative sonographic Murphy sign Common bile duct: Diameter: 5.0 mm Liver: No focal lesion identified. Within normal limits in parenchymal echogenicity. IMPRESSION: Cholelithiasis with mild gallbladder wall thickening. Bile ducts nondilated. Electronically Signed   By: Marlan Palauharles  Clark M.D.   On: 05/12/2015 16:06    Anti-infectives: Anti-infectives    Start     Dose/Rate Route Frequency Ordered Stop   05/12/15 2030  cefTRIAXone (ROCEPHIN) 2 g in dextrose 5 % 50 mL IVPB     2 g 100 mL/hr over 30 Minutes Intravenous Every 24 hours 05/12/15 1943        Assessment Active Problems:   Acute calculous cholecystitis    LOS: 1 day   Plan: Cholecystectomy today.  I have explained the procedure, risks of cholecystectomy.  Risks include but are not limited to bleeding, infection, wound problems, anesthesia, diarrhea, bile leak, injury to common bile duct/liver/intestine.  She seems to understand and agrees to proceed.    Carla [name] 05/13/2015

## 2015-05-13 NOTE — Op Note (Signed)
Laparoscopic Cholecystectomy Procedure Note  Indications: This patient presents with symptomatic gallbladder disease and will undergo laparoscopic cholecystectomy.  Pre-operative Diagnosis: Calculus of gallbladder with acute cholecystitis, without mention of obstruction  Post-operative Diagnosis: Same  Surgeon: Dajana Gehrig K.   Assistants: none  Anesthesia: General endotracheal anesthesia  ASA Class: 2  Procedure Details  The patient was seen again in the Holding Room. The risks, benefits, complications, treatment options, and expected outcomes were discussed with the patient. The possibilities of reaction to medication, pulmonary aspiration, perforation of viscus, bleeding, recurrent infection, finding a normal gallbladder, the need for additional procedures, failure to diagnose a condition, the possible need to convert to an open procedure, and creating a complication requiring transfusion or operation were discussed with the patient. The likelihood of improving the patient's symptoms with return to their baseline status is good.  The patient and/or family concurred with the proposed plan, giving informed consent. The site of surgery properly noted. The patient was taken to Operating Room, identified as Carla [name] and the procedure verified as Laparoscopic Cholecystectomy with Intraoperative Cholangiogram. A Time Out was held and the above information confirmed.  Prior to the induction of general anesthesia, antibiotic prophylaxis was administered. General endotracheal anesthesia was then administered and tolerated well. After the induction, the abdomen was prepped with Chloraprep and draped in sterile fashion. The patient was positioned in the supine position.  Local anesthetic agent was injected into the skin near the umbilicus and an incision made. We dissected down to the abdominal fascia with blunt dissection.  The fascia was incised vertically and we entered the peritoneal cavity  bluntly.  A pursestring suture of 0-Vicryl was placed around the fascial opening.  The Hasson cannula was inserted and secured with the stay suture.  Pneumoperitoneum was then created with CO2 and tolerated well without any adverse changes in the patient's vital signs. An 11-mm port was placed in the subxiphoid position.  Two 5-mm ports were placed in the right upper quadrant. All skin incisions were infiltrated with a local anesthetic agent before making the incision and placing the trocars.   We positioned the patient in reverse Trendelenburg, tilted slightly to the patient's left.  The gallbladder was identified, the fundus grasped and retracted cephalad.  We had to decompress the gallbladder with the suction aspirator.  The gallbladder was thickened and edematous. Adhesions were lysed bluntly and with the electrocautery where indicated, taking care not to injure any adjacent organs or viscus. The infundibulum was grasped and retracted laterally, exposing the peritoneum overlying the triangle of Calot. This was then divided and exposed in a blunt fashion. The cystic duct was clearly identified and bluntly dissected circumferentially. A critical view of the cystic duct and cystic artery was obtained.  The cystic duct was then ligated with clips and divided. The cystic artery was, dissected free, ligated with clips and divided as well.   The gallbladder was dissected from the liver bed in retrograde fashion with the electrocautery. The gallbladder was removed and placed in an Endocatch sac. The liver bed was irrigated and inspected. Hemostasis was achieved with the electrocautery and Surgicel SNOW. Copious irrigation was utilized and was repeatedly aspirated until clear.  The gallbladder and Endocatch sac were then removed through the umbilical port site.  The pursestring suture was used to close the umbilical fascia.    We again inspected the right upper quadrant for hemostasis.  Pneumoperitoneum was  released as we removed the trocars.  4-0 Monocryl was used to close  the skin.   Benzoin, steri-strips, and clean dressings were applied. The patient was then extubated and brought to the recovery room in stable condition. Instrument, sponge, and needle counts were correct at closure and at the conclusion of the case.   Findings: Cholecystitis with Cholelithiasis  Estimated Blood Loss: less than 100 mL         Drains: none         Specimens: Gallbladder           Complications: None; patient tolerated the procedure well.         Disposition: PACU - hemodynamically stable.         Condition: stable

## 2015-05-13 NOTE — Care Management Note (Signed)
Case Management Note  Patient Details  Name: Carla [name] MRN: [id number] Date of Birth: [birth year]  Subjective/Objective:                    Action/Plan:  Initial UR completed  Expected Discharge Date:                  Expected Discharge Plan:  Home/Self Care  In-House Referral:     Discharge planning Services     Post Acute Care Choice:    Choice offered to:     DME Arranged:    DME Agency:     HH Arranged:    HH Agency:     Status of Service:  In process, will continue to follow  Medicare Important Message Given:    Date Medicare IM Given:    Medicare IM give by:    Date Additional Medicare IM Given:    Additional Medicare Important Message give by:     If discussed at Long Length of Stay Meetings, dates discussed:    Additional Comments:  Kingsley PlanWile, Carla [name] Marie, RN 05/13/2015, 1:55 PM

## 2015-05-13 NOTE — Anesthesia Procedure Notes (Signed)
Procedure Name: Intubation Date/Time: 05/13/2015 3:30 PM Performed by: Jefm MilesENNIE, Rajiv Parlato E Pre-anesthesia Checklist: Patient identified, Emergency Drugs available, Suction available, Patient being monitored and Timeout performed Patient Re-evaluated:Patient Re-evaluated prior to inductionOxygen Delivery Method: Circle system utilized Preoxygenation: Pre-oxygenation with 100% oxygen Intubation Type: IV induction, Rapid sequence and Cricoid Pressure applied Laryngoscope Size: 3 and Mac Grade View: Grade I Tube type: Oral Tube size: 7.0 mm Number of attempts: 1 Airway Equipment and Method: Stylet Placement Confirmation: ETT inserted through vocal cords under direct vision,  positive ETCO2 and breath sounds checked- equal and bilateral Secured at: 22 cm Tube secured with: Tape Dental Injury: Teeth and Oropharynx as per pre-operative assessment

## 2015-05-13 NOTE — Transfer of Care (Signed)
Immediate Anesthesia Transfer of Care Note  Patient: Carla [name]  Procedure(s) Performed: Procedure(s): LAPAROSCOPIC CHOLECYSTECTOMY  (N/A)  Patient Location: PACU  Anesthesia Type:General  Level of Consciousness: awake  Airway & Oxygen Therapy: Patient Spontanous Breathing and Patient connected to nasal cannula oxygen  Post-op Assessment: Report given to RN  Post vital signs: Reviewed and stable  Last Vitals:  Filed Vitals:   05/13/15 0458 05/13/15 1654  BP: 114/66 120/66  Pulse: 71 80  Temp: 36.8 C 36.4 C  Resp: 19 20    Complications: No apparent anesthesia complications

## 2015-05-14 ENCOUNTER — Encounter (HOSPITAL_COMMUNITY): Payer: Self-pay | Admitting: Surgery

## 2015-05-14 LAB — COMPREHENSIVE METABOLIC PANEL
ALBUMIN: 3.2 g/dL — AB (ref 3.5–5.0)
ALT: 45 U/L (ref 14–54)
ANION GAP: 9 (ref 5–15)
AST: 33 U/L (ref 15–41)
Alkaline Phosphatase: 64 U/L (ref 38–126)
BILIRUBIN TOTAL: 0.4 mg/dL (ref 0.3–1.2)
BUN: <5 mg/dL — ABNORMAL LOW (ref 6–20)
CHLORIDE: 105 mmol/L (ref 101–111)
CO2: 24 mmol/L (ref 22–32)
Calcium: 8.9 mg/dL (ref 8.9–10.3)
Creatinine, Ser: 0.7 mg/dL (ref 0.44–1.00)
GFR calc Af Amer: >60 mL/min (ref >60)
Glucose, Bld: 100 mg/dL — ABNORMAL HIGH (ref 65–99)
POTASSIUM: 4.2 mmol/L (ref 3.5–5.1)
Sodium: 138 mmol/L (ref 135–145)
TOTAL PROTEIN: 6.4 g/dL — AB (ref 6.5–8.1)

## 2015-05-14 LAB — CBC
HCT: 38.2 % (ref 36.0–46.0)
HEMOGLOBIN: 12.2 g/dL (ref 12.0–15.0)
MCH: 28.4 pg (ref 26.0–34.0)
MCHC: 31.9 g/dL (ref 30.0–36.0)
MCV: 88.8 fL (ref 78.0–100.0)
PLATELETS: 311 10*3/uL (ref 150–400)
RBC: 4.3 MIL/uL (ref 3.87–5.11)
RDW: 13.1 % (ref 11.5–15.5)
WBC: 13.3 10*3/uL — ABNORMAL HIGH (ref 4.0–10.5)

## 2015-05-14 MED ORDER — OXYCODONE-ACETAMINOPHEN 5-325 MG PO TABS
1.0000 | ORAL_TABLET | ORAL | Status: DC | PRN
  Administered 2015-05-14 (×2): 2 via ORAL
  Filled 2015-05-14 (×2): qty 2

## 2015-05-14 MED ORDER — METHOCARBAMOL 500 MG PO TABS
1000.0000 mg | ORAL_TABLET | Freq: Three times a day (TID) | ORAL | Status: DC | PRN
  Administered 2015-05-14: 1000 mg via ORAL
  Filled 2015-05-14: qty 2

## 2015-05-14 MED ORDER — OXYCODONE-ACETAMINOPHEN 5-325 MG PO TABS: 1.0000 | ORAL_TABLET | Freq: Four times a day (QID) | ORAL | Status: DC | PRN

## 2015-05-14 MED ORDER — METHOCARBAMOL 500 MG PO TABS: 1000.0000 mg | ORAL_TABLET | Freq: Three times a day (TID) | ORAL | Status: DC | PRN

## 2015-05-14 NOTE — Anesthesia Postprocedure Evaluation (Signed)
Anesthesia Post Note  Patient: Dyanne IhaCindy L Exley  Procedure(s) Performed: Procedure(s) (LRB): LAPAROSCOPIC CHOLECYSTECTOMY  (N/A)  Patient location during evaluation: PACU Anesthesia Type: General Level of consciousness: awake and alert Pain management: pain level controlled Vital Signs Assessment: post-procedure vital signs reviewed and stable Respiratory status: spontaneous breathing, nonlabored ventilation, respiratory function stable and patient connected to nasal cannula oxygen Cardiovascular status: blood pressure returned to baseline and stable Postop Assessment: no signs of nausea or vomiting Anesthetic complications: no    Last Vitals:  Filed Vitals:   05/14/15 0116 05/14/15 0513  BP: 112/59 125/73  Pulse: 73 91  Temp: 36.9 C 36.9 C  Resp: 17 18    Last Pain:  Filed Vitals:   05/14/15 0514  PainSc: 9                  Cecile HearingStephen Edward Drisana Schweickert

## 2015-05-14 NOTE — Progress Notes (Signed)
Dorena Cookeyindy Vossler to be D/C'd home per MD order. Discussed with the patient and all questions fully answered.  VSS, Surgical sites clean, dry, intact with dressings in place.  IV catheter discontinued intact. Site without signs and symptoms of complications. Dressing and pressure applied.  An After Visit Summary was printed and given to the patient. Patient received prescriptions and work note.  D/c education completed with patient/family including follow up instructions, medication list, d/c activities limitations if indicated, with other d/c instructions as indicated by MD - patient able to verbalize understanding, all questions fully answered.   Patient instructed to return to ED, call 911, or call MD for any changes in condition.   Patient to be escorted via WC, and D/C home via private auto.

## 2015-05-14 NOTE — Discharge Summary (Signed)
[facility] Discharge Summary   Patient ID: Carla [name] MRN: [id number] DOB/AGE: Nov 23, 1990 24 y.o.  Admit date: 05/12/2015 Discharge date: 05/14/2015  Admitting Diagnosis: Acute cholecystitis  Discharge Diagnosis Patient Active Problem List   Diagnosis Date Noted  . Acute cholecystitis 05/12/2015  . Abdominal pain, lower 11/08/2013    Consultants None  Imaging: Koreas Abdomen Limited Ruq  05/12/2015  CLINICAL DATA:  Right upper quadrant pain EXAM: US ABDOMEN LIMITED - RIGHT UPPER QUADRANT COMPARISON:  None. FINDINGS: Gallbladder: Cholelithiasis. 15 mm gallstone. Mild gallbladder wall thickening 4.3 mm. Negative sonographic Murphy sign Common bile duct: Diameter: 5.0 mm Liver: No focal lesion identified. Within normal limits in parenchymal echogenicity. IMPRESSION: Cholelithiasis with mild gallbladder wall thickening. Bile ducts nondilated. Electronically Signed   By: Marlan Palauharles  Clark M.D.   On: 05/12/2015 16:06    Procedures Dr. Corliss Skainssuei (05/13/15) - Laparoscopic Cholecystectomy  Hospital Course:  24 y/[facility] day hx of RUQ abdominal pain Nausea and vomiting.  Pain location RUQ abdomen Constant and 6/10. Food makes it worse. U/S shows gallstones and GB wall thickening.    Patient was admitted and underwent procedure listed above.  Tolerated procedure well and was transferred to the floor.  Diet was advanced as tolerated.  We added robaxin on to help control the pain.  On POD #1, the patient was voiding well, tolerating diet, ambulating well, pain well controlled, vital signs stable, incisions c/d/i and felt stable for discharge home.  Patient will follow up in our office in 2 weeks and knows to call with questions or concerns.    Physical Exam: General:  Alert, NAD, pleasant, comfortable Abd:  Soft, ND, mild tenderness, incisions C/D/I     Medication List    TAKE these medications        acetaminophen 500 MG tablet  Commonly  known as:  TYLENOL  Take 1,000 mg by mouth every 6 (six) hours as needed for fever. Reported on 05/12/2015     ENEMA RE  Place 1 application rectally daily as needed (constipation).     Fluoxetine HCl (PMDD) 20 MG Caps  Take 1 capsule (20 mg total) by mouth daily after breakfast.     ibuprofen 200 MG tablet  Commonly known as:  ADVIL,MOTRIN  Take 800 mg by mouth every 6 (six) hours as needed for mild pain.     levonorgestrel-ethinyl estradiol 0.15-30 MG-MCG tablet  Commonly known as:  LEVORA 0.15/30 (28)  Take 1 tablet by mouth daily.     methocarbamol 500 MG tablet  Commonly known as:  ROBAXIN  Take 2 tablets (1,000 mg total) by mouth every 8 (eight) hours as needed for muscle spasms.     OVER THE COUNTER MEDICATION  Place 1 suppository rectally daily as needed (constipation).     oxyCODONE-acetaminophen 5-325 MG tablet  Commonly known as:  PERCOCET/ROXICET  Take 1-2 tablets by mouth every 6 (six) hours as needed for moderate pain.         Follow-up Information    [facility].   Specialty:  General Surgery   Why:  For post-operation check. Your appointment is at 11:45am, please arrive at least 30min before your appointment to complete your check in paperwork.  If you are unable to arrive 30min prior to your appointment time we may have to cancel or reschedule you.   Contact information:   [address]  [address] [phone]      Signed: Nonie Hoyer, [facility] [phone]  05/14/2015, 1:59 PM

## 2015-05-14 NOTE — Discharge Instructions (Signed)
Your appointment is at 11:45am, please arrive at least 30min before your appointment to complete your check in paperwork.  If you are unable to arrive 30min prior to your appointment time we may have to cancel or reschedule you. ° °LAPAROSCOPIC SURGERY: POST OP INSTRUCTIONS  °1. DIET: Follow a light bland diet the first 24 hours after arrival home, such as soup, liquids, crackers, etc. Be sure to include lots of fluids daily. Avoid fast food or heavy meals as your are more likely to get nauseated. Eat a low fat the next few days after surgery.  °2. Take your usually prescribed home medications unless otherwise directed. °3. PAIN CONTROL:  °1. Pain is best controlled by a usual combination of three different methods TOGETHER:  °1. Ice/Heat °2. Over the counter pain medication °3. Prescription pain medication °2. Most patients will experience some swelling and bruising around the incisions. Ice packs or heating pads (30-60 minutes up to 6 times a day) will help. Use ice for the first few days to help decrease swelling and bruising, then switch to heat to help relax tight/sore spots and speed recovery. Some people prefer to use ice alone, heat alone, alternating between ice & heat. Experiment to what works for you. Swelling and bruising can take several weeks to resolve.  °3. It is helpful to take an over-the-counter pain medication regularly for the first few weeks. Choose one of the following that works best for you:  °1. Naproxen (Aleve, etc) Two 220mg tabs twice a day °2. Ibuprofen (Advil, etc) Three 200mg tabs four times a day (every meal & bedtime) °3. Acetaminophen (Tylenol, etc) 500-650mg four times a day (every meal & bedtime) °4. A prescription for pain medication (such as oxycodone, hydrocodone, etc) should be given to you upon discharge. Take your pain medication as prescribed.  °1. If you are having problems/concerns with the prescription medicine (does not control pain, nausea, vomiting, rash, itching,  etc), please call us [phone] to see if we need to switch you to a different pain medicine that will work better for you and/or control your side effect better. °2. If you need a refill on your pain medication, please contact your pharmacy. They will contact our office to request authorization. Prescriptions will not be filled after 5 pm or on week-ends. °4. Avoid getting constipated. Between the surgery and the pain medications, it is common to experience some constipation. Increasing fluid intake and taking a fiber supplement (such as Metamucil, Citrucel, FiberCon, MiraLax, etc) 1-2 times a day regularly will usually help prevent this problem from occurring. A mild laxative (prune juice, Milk of Magnesia, MiraLax, etc) should be taken according to package directions if there are no bowel movements after 48 hours.  °5. Watch out for diarrhea. If you have many loose bowel movements, simplify your diet to bland foods & liquids for a few days. Stop any stool softeners and decrease your fiber supplement. Switching to mild anti-diarrheal medications (Kayopectate, Pepto Bismol) can help. If this worsens or does not improve, please call us. °6. Wash / shower every day. You may shower over the dressings as they are waterproof. Continue to shower over incision(s) after the dressing is off. °7. Remove your waterproof bandages 5 days after surgery. You may leave the incision open to air. You may replace a dressing/Band-Aid to cover the incision for comfort if you wish.  °8. ACTIVITIES as tolerated:  °1. You may resume regular (light) daily activities beginning the next day--such as daily self-care,   walking, climbing stairs--gradually increasing activities as tolerated. If you can walk 30 minutes without difficulty, it is safe to try more intense activity such as jogging, treadmill, bicycling, low-impact aerobics, swimming, etc. °2. Save the most intensive and strenuous activity for last such as sit-ups, heavy lifting,  contact sports, etc Refrain from any heavy lifting or straining until you are off narcotics for pain control.  °3. DO NOT PUSH THROUGH PAIN. Let pain be your guide: If it hurts to do something, don't do it. Pain is your body warning you to avoid that activity for another week until the pain goes down. °4. You may drive when you are no longer taking prescription pain medication, you can comfortably wear a seatbelt, and you can safely maneuver your car and apply brakes. °5. You may have sexual intercourse when it is comfortable.  °9. FOLLOW UP in our office  °1. Please call [facility] at [phone] to set up an appointment to see your surgeon in the office for a follow-up appointment approximately 2-3 weeks after your surgery. °2. Make sure that you call for this appointment the day you arrive home to insure a convenient appointment time. °     10. IF YOU HAVE DISABILITY OR FAMILY LEAVE FORMS, BRING THEM TO THE               OFFICE FOR PROCESSING.  ° °WHEN TO CALL US [phone]:  °1. Poor pain control °2. Reactions / problems with new medications (rash/itching, nausea, etc)  °3. Fever over 101.5 F (38.5 C) °4. Inability to urinate °5. Nausea and/or vomiting °6. Worsening swelling or bruising °7. Continued bleeding from incision. °8. Increased pain, redness, or drainage from the incision ° °The clinic staff is available to answer your questions during regular business hours (8:30am-5pm). Please don’t hesitate to call and ask to speak to one of our nurses for clinical concerns.  °If you have a medical emergency, go to the nearest emergency room or call 911.  °A surgeon from [facility] is always on call at the hospitals  ° °[facility]  °[address], [address], , [place] [address] ?  °MAIN: [phone] ? TOLL FREE: [phone] ?  °FAX [phone]  °[email or web address] ° ° °

## 2015-10-28 ENCOUNTER — Ambulatory Visit: Payer: 59 | Admitting: Obstetrics

## 2015-11-17 ENCOUNTER — Other Ambulatory Visit: Payer: Self-pay | Admitting: Obstetrics

## 2015-11-26 ENCOUNTER — Ambulatory Visit (INDEPENDENT_AMBULATORY_CARE_PROVIDER_SITE_OTHER): Payer: BLUE CROSS/BLUE SHIELD | Admitting: Physician Assistant

## 2015-11-26 ENCOUNTER — Ambulatory Visit (INDEPENDENT_AMBULATORY_CARE_PROVIDER_SITE_OTHER): Payer: BLUE CROSS/BLUE SHIELD

## 2015-11-26 VITALS — BP 118/62 | HR 83 | Temp 98.2°F | Resp 16 | Ht 64.57 in | Wt 196.0 lb

## 2015-11-26 DIAGNOSIS — R220 Localized swelling, mass and lump, head: Secondary | ICD-10-CM

## 2015-11-26 DIAGNOSIS — D72828 Other elevated white blood cell count: Secondary | ICD-10-CM | POA: Diagnosis not present

## 2015-11-26 LAB — POCT CBC
Granulocyte percent: 68.9 %G (ref 37–80)
HEMATOCRIT: 36 % — AB (ref 37.7–47.9)
Hemoglobin: 12.5 g/dL (ref 12.2–16.2)
Lymph, poc: 2.8 (ref 0.6–3.4)
MCH: 29.6 pg (ref 27–31.2)
MCHC: 34.7 g/dL (ref 31.8–35.4)
MCV: 85.3 fL (ref 80–97)
MID (CBC): 0.8 (ref 0–0.9)
MPV: 6.5 fL (ref 0–99.8)
POC GRANULOCYTE: 7.9 — AB (ref 2–6.9)
POC LYMPH PERCENT: 24 %L (ref 10–50)
POC MID %: 7.1 % (ref 0–12)
Platelet Count, POC: 310 10*3/uL (ref 142–424)
RBC: 4.22 M/uL (ref 4.04–5.48)
RDW, POC: 13.2 %
WBC: 11.5 10*3/uL — AB (ref 4.6–10.2)

## 2015-11-26 MED ORDER — AMOXICILLIN-POT CLAVULANATE 875-125 MG PO TABS: 1.0000 | ORAL_TABLET | Freq: Two times a day (BID) | ORAL | Status: DC

## 2015-11-26 NOTE — Patient Instructions (Signed)
     IF you received an x-ray today, you will receive an invoice from [facility]. Please contact [facility] at [phone] with questions or concerns regarding your invoice.   IF you received labwork today, you will receive an invoice from [facility]. Please contact [facility] at [phone] with questions or concerns regarding your invoice.   Our billing staff will not be able to assist you with questions regarding bills from these companies.  You will be contacted with the lab results as soon as they are available. The fastest way to get your results is to activate your My Chart account. Instructions are located on the last page of this paperwork. If you have not heard from us regarding the results in 2 weeks, please contact this office.      

## 2015-11-26 NOTE — Progress Notes (Signed)
11/26/2015 6:54 PM   DOB: [birth year] / MRN: [id number]  SUBJECTIVE:  Carla [name] is a 25 y.o. female presenting for facial swelling that started today.  Says the swelling is not pruritic. She denies pain, fever, chills, teeth pain.  She has not tried anything for the problem yet.  She has never had this before.  She denies any new medications or foods.   She is allergic to shellfish allergy and latex.   She  has a past medical history of Spontaneous abortion (12/03/2010); Allergy; Depression; PONV (postoperative nausea and vomiting); Headache(784.0); and Anemia.    She  reports that she has never smoked. She has never used smokeless tobacco. She reports that she drinks alcohol. She reports that she does not use illicit drugs. She  reports that she currently engages in sexual activity and has had female partners. She reports using the following method of birth control/protection: Pill. The patient  has past surgical history that includes Dilation and evacuation (12/03/2010); Ovarian cyst removal; Tonsillectomy; and Cholecystectomy (N/A, 05/13/2015).  Her family history includes Cancer in her other; Diabetes in her other; Heart failure in her other; Hypertension in her other; Seizures in her other; Stroke in her other.  Review of Systems  Constitutional: Negative for fever, chills and malaise/fatigue.  HENT: Positive for congestion (chronic). Negative for sore throat.   Eyes: Negative for blurred vision, photophobia, pain, discharge and redness.  Respiratory: Negative for cough.   Cardiovascular: Negative for chest pain.  Gastrointestinal: Negative for nausea.  Skin: Negative for itching.  Neurological: Negative for dizziness and headaches.    Problem list and medications reviewed and updated by myself where necessary, and exist elsewhere in the encounter.   OBJECTIVE:  BP 118/62 mmHg  Pulse 83  Temp(Src) 98.2 F (36.8 C)  Resp 16  Ht 5' 4.57" (1.64 m)  Wt 196 lb (88.905 kg)  BMI  33.06 kg/m2  SpO2 100%  LMP 11/12/2015 (Approximate)  Results for orders placed or performed in visit on 11/26/15  POCT CBC  Result Value Ref Range   WBC 11.5 (A) 4.6 - 10.2 K/uL   Lymph, poc 2.8 0.6 - 3.4   POC LYMPH PERCENT 24.0 10 - 50 %L   MID (cbc) 0.8 0 - 0.9   POC MID % 7.1 0 - 12 %M   POC Granulocyte 7.9 (A) 2 - 6.9   Granulocyte percent 68.9 37 - 80 %G   RBC 4.22 4.04 - 5.48 M/uL   Hemoglobin 12.5 12.2 - 16.2 g/dL   HCT, POC 91.436.0 (A) 78.237.7 - 47.9 %   MCV 85.3 80 - 97 fL   MCH, POC 29.6 27 - 31.2 pg   MCHC 34.7 31.8 - 35.4 g/dL   RDW, POC 95.613.2 %   Platelet Count, POC 310 142 - 424 K/uL   MPV 6.5 0 - 99.8 fL      Physical Exam  Constitutional: She is oriented to person, place, and time. She appears well-developed and well-nourished. No distress.  HENT:  Head:    Mouth/Throat: Uvula is midline, oropharynx is clear and moist and mucous membranes are normal.  Eyes: EOM are normal. Pupils are equal, round, and reactive to light.  Cardiovascular: Normal rate and regular rhythm.   Pulmonary/Chest: Effort normal and breath sounds normal.  Abdominal: She exhibits no distension.  Neurological: She is alert and oriented to person, place, and time. No cranial nerve deficit. Gait normal.  Skin: Skin is warm and dry. She is not  diaphoretic.  Psychiatric: She has a normal mood and affect.  Vitals reviewed.   Dg Sinus 1-2 Views  11/26/2015  CLINICAL DATA:  Facial swelling since this morning. Right-sided maxillary swelling. EXAM: PARANASAL SINUSES - 1-2 VIEW COMPARISON:  None. FINDINGS: Single Waters view of the paranasal sinuses. The paranasal sinus are aerated. There is no evidence of sinus opacification, air-fluid levels or mucosal thickening. No significant bone abnormalities are seen. IMPRESSION: Negative single-view radiograph of the paranasal sinuses. Electronically Signed   By: Rubye OaksMelanie  Ehinger M.D.   On: 11/26/2015 18:41    ASSESSMENT AND PLAN  Carla [name] was seen today for  facial swelling.  Diagnoses and all orders for this visit:  Facial swelling: Tooth infection vs. sinus infection vs angioedema.  Doubt the latter. Will start augmentin given the anatomical location and granulocytosis. She will return if she does not improve or worsens.    -     DG SinUS 1-2 Views; Future -     POCT CBC    The patient was advised to call or return to clinic if she does not see an improvement in symptoms, or to seek the care of the closest emergency department if she worsens with the above plan.   Deliah BostonMichael Charniece Venturino, MHS, PA-C [facility] 11/26/2015 6:54 PM

## 2015-12-13 ENCOUNTER — Other Ambulatory Visit: Payer: Self-pay | Admitting: Obstetrics

## 2015-12-29 ENCOUNTER — Other Ambulatory Visit: Payer: Self-pay | Admitting: Family Medicine

## 2015-12-29 ENCOUNTER — Ambulatory Visit
Admission: RE | Admit: 2015-12-29 | Discharge: 2015-12-29 | Disposition: A | Discharge status: Home / Self Care | Payer: BLUE CROSS/BLUE SHIELD | Source: Ambulatory Visit | Attending: Family Medicine | Admitting: Family Medicine

## 2015-12-29 DIAGNOSIS — M545 Low back pain: Secondary | ICD-10-CM

## 2016-01-12 ENCOUNTER — Other Ambulatory Visit: Payer: Self-pay | Admitting: Obstetrics

## 2016-02-11 ENCOUNTER — Other Ambulatory Visit: Payer: Self-pay | Admitting: Obstetrics

## 2016-03-07 ENCOUNTER — Other Ambulatory Visit: Payer: Self-pay | Admitting: Obstetrics

## 2016-03-17 ENCOUNTER — Ambulatory Visit (INDEPENDENT_AMBULATORY_CARE_PROVIDER_SITE_OTHER): Payer: BLUE CROSS/BLUE SHIELD | Admitting: Family Medicine

## 2016-03-17 VITALS — BP 118/76 | HR 75 | Temp 98.5°F | Resp 16 | Ht 65.0 in | Wt 207.0 lb

## 2016-03-17 DIAGNOSIS — S39012D Strain of muscle, fascia and tendon of lower back, subsequent encounter: Secondary | ICD-10-CM

## 2016-03-17 NOTE — Patient Instructions (Addendum)
   IF you received an x-ray today, you will receive an invoice from [facility]. Please contact [facility] at [phone] with questions or concerns regarding your invoice.   IF you received labwork today, you will receive an invoice from [facility]. Please contact [facility] at [phone] with questions or concerns regarding your invoice.   Our billing staff will not be able to assist you with questions regarding bills from these companies.  You will be contacted with the lab results as soon as they are available. The fastest way to get your results is to activate your My Chart account. Instructions are located on the last page of this paperwork. If you have not heard from us regarding the results in 2 weeks, please contact this office.     Low Back Strain With Rehab A strain is an injury in which a tendon or muscle is torn. The muscles and tendons of the lower back are vulnerable to strains. However, these muscles and tendons are very strong and require a great force to be injured. Strains are classified into three categories. Grade 1 strains cause pain, but the tendon is not lengthened. Grade 2 strains include a lengthened ligament, due to the ligament being stretched or partially ruptured. With grade 2 strains there is still function, although the function may be decreased. Grade 3 strains involve a complete tear of the tendon or muscle, and function is usually impaired. SYMPTOMS   Pain in the lower back.  Pain that affects one side more than the other.  Pain that gets worse with movement and may be felt in the hip, buttocks, or back of the thigh.  Muscle spasms of the muscles in the back.  Swelling along the muscles of the back.  Loss of strength of the back muscles.  Crackling sound (crepitation) when the muscles are touched. CAUSES  Lower back strains occur when a force is placed on the muscles or tendons that is greater than they  can handle. Common causes of injury include:  Prolonged overuse of the muscle-tendon units in the lower back, usually from incorrect posture.  A single violent injury or force applied to the back. RISK INCREASES WITH:  Sports that involve twisting forces on the spine or a lot of bending at the waist (football, rugby, weightlifting, bowling, golf, tennis, speed skating, racquetball, swimming, running, gymnastics, diving).  Poor strength and flexibility.  Failure to warm up properly before activity.  Family history of lower back pain or disk disorders.  Previous back injury or surgery (especially fusion).  Poor posture with lifting, especially heavy objects.  Prolonged sitting, especially with poor posture. PREVENTION   Learn and use proper posture when sitting or lifting (maintain proper posture when sitting, lift using the knees and legs, not at the waist).  Warm up and stretch properly before activity.  Allow for adequate recovery between workouts.  Maintain physical fitness:  Strength, flexibility, and endurance.  Cardiovascular fitness. PROGNOSIS  If treated properly, lower back strains usually heal within 6 weeks. RELATED COMPLICATIONS   Recurring symptoms, resulting in a chronic problem.  Chronic inflammation, scarring, and partial muscle-tendon tear.  Delayed healing or resolution of symptoms.  Prolonged disability. TREATMENT  Treatment first involves the use of ice and medicine, to reduce pain and inflammation. The use of strengthening and stretching exercises may help reduce pain with activity. These exercises may be performed at home or with a therapist. Severe injuries may require referral to a therapist for further   evaluation and treatment, such as ultrasound. Your caregiver may advise that you wear a back brace or corset, to help reduce pain and discomfort. Often, prolonged bed rest results in greater harm then benefit. Corticosteroid injections may be  recommended. However, these should be reserved for the most serious cases. It is important to avoid using your back when lifting objects. At night, sleep on your back on a firm mattress with a pillow placed under your knees. If non-surgical treatment is unsuccessful, surgery may be needed.  MEDICATION   If pain medicine is needed, nonsteroidal anti-inflammatory medicines (aspirin and ibuprofen), or other minor pain relievers (acetaminophen), are often advised.  Do not take pain medicine for 7 days before surgery.  Prescription pain relievers may be given, if your caregiver thinks they are needed. Use only as directed and only as much as you need.  Ointments applied to the skin may be helpful.  Corticosteroid injections may be given by your caregiver. These injections should be reserved for the most serious cases, because they may only be given a certain number of times. HEAT AND COLD  Cold treatment (icing) should be applied for 10 to 15 minutes every 2 to 3 hours for inflammation and pain, and immediately after activity that aggravates your symptoms. Use ice packs or an ice massage.  Heat treatment may be used before performing stretching and strengthening activities prescribed by your caregiver, physical therapist, or athletic trainer. Use a heat pack or a warm water soak. SEEK MEDICAL CARE IF:   Symptoms get worse or do not improve in 2 to 4 weeks, despite treatment.  You develop numbness, weakness, or loss of bowel or bladder function.  New, unexplained symptoms develop. (Drugs used in treatment may produce side effects.) EXERCISES  RANGE OF MOTION (ROM) AND STRETCHING EXERCISES - Low Back Strain Most people with lower back pain will find that their symptoms get worse with excessive bending forward (flexion) or arching at the lower back (extension). The exercises which will help resolve your symptoms will focus on the opposite motion.  Your physician, physical therapist or athletic  trainer will help you determine which exercises will be most helpful to resolve your lower back pain. Do not complete any exercises without first consulting with your caregiver. Discontinue any exercises which make your symptoms worse until you speak to your caregiver.  If you have pain, numbness or tingling which travels down into your buttocks, leg or foot, the goal of the therapy is for these symptoms to move closer to your back and eventually resolve. Sometimes, these leg symptoms will get better, but your lower back pain may worsen. This is typically an indication of progress in your rehabilitation. Be very alert to any changes in your symptoms and the activities in which you participated in the 24 hours prior to the change. Sharing this information with your caregiver will allow him/her to most efficiently treat your condition.  These exercises may help you when beginning to rehabilitate your injury. Your symptoms may resolve with or without further involvement from your physician, physical therapist or athletic trainer. While completing these exercises, remember:  Restoring tissue flexibility helps normal motion to return to the joints. This allows healthier, less painful movement and activity.  An effective stretch should be held for at least 30 seconds.  A stretch should never be painful. You should only feel a gentle lengthening or release in the stretched tissue. FLEXION RANGE OF MOTION AND STRETCHING EXERCISES: STRETCH - Flexion, Single Knee to Chest     Lie on a firm bed or floor with both legs extended in front of you.  Keeping one leg in contact with the floor, bring your opposite knee to your chest. Hold your leg in place by either grabbing behind your thigh or at your knee.  Pull until you feel a gentle stretch in your lower back. Hold __________ seconds.  Slowly release your grasp and repeat the exercise with the opposite side. Repeat __________ times. Complete this exercise  __________ times per day.  STRETCH - Flexion, Double Knee to Chest   Lie on a firm bed or floor with both legs extended in front of you.  Keeping one leg in contact with the floor, bring your opposite knee to your chest.  Tense your stomach muscles to support your back and then lift your other knee to your chest. Hold your legs in place by either grabbing behind your thighs or at your knees.  Pull both knees toward your chest until you feel a gentle stretch in your lower back. Hold __________ seconds.  Tense your stomach muscles and slowly return one leg at a time to the floor. Repeat __________ times. Complete this exercise __________ times per day.  STRETCH - Low Trunk Rotation  Lie on a firm bed or floor. Keeping your legs in front of you, bend your knees so they are both pointed toward the ceiling and your feet are flat on the floor.  Extend your arms out to the side. This will stabilize your upper body by keeping your shoulders in contact with the floor.  Gently and slowly drop both knees together to one side until you feel a gentle stretch in your lower back. Hold for __________ seconds.  Tense your stomach muscles to support your lower back as you bring your knees back to the starting position. Repeat the exercise to the other side. Repeat __________ times. Complete this exercise __________ times per day  EXTENSION RANGE OF MOTION AND FLEXIBILITY EXERCISES: STRETCH - Extension, Prone on Elbows   Lie on your stomach on the floor, a bed will be too soft. Place your palms about shoulder width apart and at the height of your head.  Place your elbows under your shoulders. If this is too painful, stack pillows under your chest.  Allow your body to relax so that your hips drop lower and make contact more completely with the floor.  Hold this position for __________ seconds.  Slowly return to lying flat on the floor. Repeat __________ times. Complete this exercise __________ times  per day.  RANGE OF MOTION - Extension, Prone Press Ups  Lie on your stomach on the floor, a bed will be too soft. Place your palms about shoulder width apart and at the height of your head.  Keeping your back as relaxed as possible, slowly straighten your elbows while keeping your hips on the floor. You may adjust the placement of your hands to maximize your comfort. As you gain motion, your hands will come more underneath your shoulders.  Hold this position __________ seconds.  Slowly return to lying flat on the floor. Repeat __________ times. Complete this exercise __________ times per day.  RANGE OF MOTION- Quadruped, Neutral Spine   Assume a hands and knees position on a firm surface. Keep your hands under your shoulders and your knees under your hips. You may place padding under your knees for comfort.  Drop your head and point your tail bone toward the ground below you. This will round out   your lower back like an angry cat. Hold this position for __________ seconds.  Slowly lift your head and release your tail bone so that your back sags into a large arch, like an old horse.  Hold this position for __________ seconds.  Repeat this until you feel limber in your lower back.  Now, find your "sweet spot." This will be the most comfortable position somewhere between the two previous positions. This is your neutral spine. Once you have found this position, tense your stomach muscles to support your lower back.  Hold this position for __________ seconds. Repeat __________ times. Complete this exercise __________ times per day.  STRENGTHENING EXERCISES - Low Back Strain These exercises may help you when beginning to rehabilitate your injury. These exercises should be done near your "sweet spot." This is the neutral, low-back arch, somewhere between fully rounded and fully arched, that is your least painful position. When performed in this safe range of motion, these exercises can be used  for people who have either a flexion or extension based injury. These exercises may resolve your symptoms with or without further involvement from your physician, physical therapist or athletic trainer. While completing these exercises, remember:   Muscles can gain both the endurance and the strength needed for everyday activities through controlled exercises.  Complete these exercises as instructed by your physician, physical therapist or athletic trainer. Increase the resistance and repetitions only as guided.  You may experience muscle soreness or fatigue, but the pain or discomfort you are trying to eliminate should never worsen during these exercises. If this pain does worsen, stop and make certain you are following the directions exactly. If the pain is still present after adjustments, discontinue the exercise until you can discuss the trouble with your caregiver. STRENGTHENING - Deep Abdominals, Pelvic Tilt  Lie on a firm bed or floor. Keeping your legs in front of you, bend your knees so they are both pointed toward the ceiling and your feet are flat on the floor.  Tense your lower abdominal muscles to press your lower back into the floor. This motion will rotate your pelvis so that your tail bone is scooping upwards rather than pointing at your feet or into the floor.  With a gentle tension and even breathing, hold this position for __________ seconds. Repeat __________ times. Complete this exercise __________ times per day.  STRENGTHENING - Abdominals, Crunches   Lie on a firm bed or floor. Keeping your legs in front of you, bend your knees so they are both pointed toward the ceiling and your feet are flat on the floor. Cross your arms over your chest.  Slightly tip your chin down without bending your neck.  Tense your abdominals and slowly lift your trunk high enough to just clear your shoulder blades. Lifting higher can put excessive stress on the lower back and does not further  strengthen your abdominal muscles.  Control your return to the starting position. Repeat __________ times. Complete this exercise __________ times per day.  STRENGTHENING - Quadruped, Opposite UE/LE Lift   Assume a hands and knees position on a firm surface. Keep your hands under your shoulders and your knees under your hips. You may place padding under your knees for comfort.  Find your neutral spine and gently tense your abdominal muscles so that you can maintain this position. Your shoulders and hips should form a rectangle that is parallel with the floor and is not twisted.  Keeping your trunk steady, lift your right   hand no higher than your shoulder and then your left leg no higher than your hip. Make sure you are not holding your breath. Hold this position __________ seconds.  Continuing to keep your abdominal muscles tense and your back steady, slowly return to your starting position. Repeat with the opposite arm and leg. Repeat __________ times. Complete this exercise __________ times per day.  STRENGTHENING - Lower Abdominals, Double Knee Lift  Lie on a firm bed or floor. Keeping your legs in front of you, bend your knees so they are both pointed toward the ceiling and your feet are flat on the floor.  Tense your abdominal muscles to brace your lower back and slowly lift both of your knees until they come over your hips. Be certain not to hold your breath.  Hold __________ seconds. Using your abdominal muscles, return to the starting position in a slow and controlled manner. Repeat __________ times. Complete this exercise __________ times per day.  POSTURE AND BODY MECHANICS CONSIDERATIONS - Low Back Strain Keeping correct posture when sitting, standing or completing your activities will reduce the stress put on different body tissues, allowing injured tissues a chance to heal and limiting painful experiences. The following are general guidelines for improved posture. Your physician  or physical therapist will provide you with any instructions specific to your needs. While reading these guidelines, remember:  The exercises prescribed by your provider will help you have the flexibility and strength to maintain correct postures.  The correct posture provides the best environment for your joints to work. All of your joints have less wear and tear when properly supported by a spine with good posture. This means you will experience a healthier, less painful body.  Correct posture must be practiced with all of your activities, especially prolonged sitting and standing. Correct posture is as important when doing repetitive low-stress activities (typing) as it is when doing a single heavy-load activity (lifting). RESTING POSITIONS Consider which positions are most painful for you when choosing a resting position. If you have pain with flexion-based activities (sitting, bending, stooping, squatting), choose a position that allows you to rest in a less flexed posture. You would want to avoid curling into a fetal position on your side. If your pain worsens with extension-based activities (prolonged standing, working overhead), avoid resting in an extended position such as sleeping on your stomach. Most people will find more comfort when they rest with their spine in a more neutral position, neither too rounded nor too arched. Lying on a non-sagging bed on your side with a pillow between your knees, or on your back with a pillow under your knees will often provide some relief. Keep in mind, being in any one position for a prolonged period of time, no matter how correct your posture, can still lead to stiffness. PROPER SITTING POSTURE In order to minimize stress and discomfort on your spine, you must sit with correct posture. Sitting with good posture should be effortless for a healthy body. Returning to good posture is a gradual process. Many people can work toward this most comfortably by using  various supports until they have the flexibility and strength to maintain this posture on their own. When sitting with proper posture, your ears will fall over your shoulders and your shoulders will fall over your hips. You should use the back of the chair to support your upper back. Your lower back will be in a neutral position, just slightly arched. You may place a small pillow or   folded towel at the base of your lower back for support.  When working at a desk, create an environment that supports good, upright posture. Without extra support, muscles tire, which leads to excessive strain on joints and other tissues. Keep these recommendations in mind: CHAIR:  A chair should be able to slide under your desk when your back makes contact with the back of the chair. This allows you to work closely.  The chair's height should allow your eyes to be level with the upper part of your monitor and your hands to be slightly lower than your elbows. BODY POSITION  Your feet should make contact with the floor. If this is not possible, use a foot rest.  Keep your ears over your shoulders. This will reduce stress on your neck and lower back. INCORRECT SITTING POSTURES  If you are feeling tired and unable to assume a healthy sitting posture, do not slouch or slump. This puts excessive strain on your back tissues, causing more damage and pain. Healthier options include:  Using more support, like a lumbar pillow.  Switching tasks to something that requires you to be upright or walking.  Talking a brief walk.  Lying down to rest in a neutral-spine position. PROLONGED STANDING WHILE SLIGHTLY LEANING FORWARD  When completing a task that requires you to lean forward while standing in one place for a long time, place either foot up on a stationary 2-4 inch high object to help maintain the best posture. When both feet are on the ground, the lower back tends to lose its slight inward curve. If this curve flattens (or  becomes too large), then the back and your other joints will experience too much stress, tire more quickly, and can cause pain. CORRECT STANDING POSTURES Proper standing posture should be assumed with all daily activities, even if they only take a few moments, like when brushing your teeth. As in sitting, your ears should fall over your shoulders and your shoulders should fall over your hips. You should keep a slight tension in your abdominal muscles to brace your spine. Your tailbone should point down to the ground, not behind your body, resulting in an over-extended swayback posture.  INCORRECT STANDING POSTURES  Common incorrect standing postures include a forward head, locked knees and/or an excessive swayback. WALKING Walk with an upright posture. Your ears, shoulders and hips should all line-up. PROLONGED ACTIVITY IN A FLEXED POSITION When completing a task that requires you to bend forward at your waist or lean over a low surface, try to find a way to stabilize 3 out of 4 of your limbs. You can place a hand or elbow on your thigh or rest a knee on the surface you are reaching across. This will provide you more stability so that your muscles do not fatigue as quickly. By keeping your knees relaxed, or slightly bent, you will also reduce stress across your lower back. CORRECT LIFTING TECHNIQUES DO :   Assume a wide stance. This will provide you more stability and the opportunity to get as close as possible to the object which you are lifting.  Tense your abdominals to brace your spine. Bend at the knees and hips. Keeping your back locked in a neutral-spine position, lift using your leg muscles. Lift with your legs, keeping your back straight.  Test the weight of unknown objects before attempting to lift them.  Try to keep your elbows locked down at your sides in order get the best strength from your   shoulders when carrying an object.  Always ask for help when lifting heavy or awkward  objects. INCORRECT LIFTING TECHNIQUES DO NOT:   Lock your knees when lifting, even if it is a small object.  Bend and twist. Pivot at your feet or move your feet when needing to change directions.  Assume that you can safely pick up even a paper clip without proper posture.   This information is not intended to replace advice given to you by your health care provider. Make sure you discuss any questions you have with your health care provider.   Document Released: 05/09/2005 Document Revised: 05/30/2014 Document Reviewed: 08/21/2008 Else[employer] Interactive Patient Education 2016 [employer]   

## 2016-03-17 NOTE — Progress Notes (Signed)
This is a 25 year old woman who presents with swollen area on the low back. She's married and has a 27-year-old. 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  She initially injured her back in July and has been taking meloxicam and Flexeril since with no improvement. Her pain does not completely limit her but it is uncomfortable.  She recently centimeter and saw some swelling just above the gluteal cleft and was concerned.  Objective:BP 118/76 (BP Location: Left Arm, Patient Position: Sitting, Cuff Size: Normal)   Pulse 75   Temp 98.5 F (36.9 C) (Oral)   Resp 16   Ht 5\' 5"  (1.651 m)   Wt 207 lb (93.9 kg)   LMP 02/24/2016 (Exact Date)   SpO2 100%   BMI 34.45 kg/m  Inspection of the low back reveals mild swelling at the top of the sacral area. This is nontender and there is no fluctuance. She has good range of motion of both lower extremities with straight leg raising and crossover testing. She has good strength and muscle tone bilaterally. She has no numbness in her legs.  I reviewed for her the exercises that she needs to do to improve her core strength. She also needs to lose some weight.  Assessment: Chronic mild low back pain with no signs of nerve root impingement, infection, or neoplastic disease.  Plan: Patient advised to stop her medicine since they are not working. I demonstrated a variety of stretching and back strengthening exercises that she should undergo. She agrees to do this.  Signed, Sheila OatsKurt Angi Goodell M.D.

## 2016-04-09 ENCOUNTER — Other Ambulatory Visit: Payer: Self-pay | Admitting: Obstetrics

## 2016-05-05 ENCOUNTER — Other Ambulatory Visit: Payer: Self-pay | Admitting: Obstetrics

## 2016-05-30 ENCOUNTER — Emergency Department (HOSPITAL_COMMUNITY)
Admission: EM | Admit: 2016-05-30 | Discharge: 2016-05-30 | Disposition: A | Discharge status: Home / Self Care | Payer: BLUE CROSS/BLUE SHIELD | Attending: Emergency Medicine | Admitting: Emergency Medicine

## 2016-05-30 ENCOUNTER — Encounter (HOSPITAL_COMMUNITY): Payer: Self-pay

## 2016-05-30 ENCOUNTER — Emergency Department (HOSPITAL_COMMUNITY): Payer: BLUE CROSS/BLUE SHIELD

## 2016-05-30 DIAGNOSIS — Z91040 Latex allergy status: Secondary | ICD-10-CM | POA: Insufficient documentation

## 2016-05-30 DIAGNOSIS — J069 Acute upper respiratory infection, unspecified: Secondary | ICD-10-CM | POA: Insufficient documentation

## 2016-05-30 DIAGNOSIS — R05 Cough: Secondary | ICD-10-CM | POA: Diagnosis present

## 2016-05-30 LAB — CBC WITH DIFFERENTIAL/PLATELET
BASOS ABS: 0 10*3/uL (ref 0.0–0.1)
Basophils Relative: 0 %
EOS PCT: 2 %
Eosinophils Absolute: 0.2 10*3/uL (ref 0.0–0.7)
HCT: 40.8 % (ref 36.0–46.0)
Hemoglobin: 13.5 g/dL (ref 12.0–15.0)
LYMPHS PCT: 33 %
Lymphs Abs: 2.3 10*3/uL (ref 0.7–4.0)
MCH: 28.8 pg (ref 26.0–34.0)
MCHC: 33.1 g/dL (ref 30.0–36.0)
MCV: 87 fL (ref 78.0–100.0)
MONO ABS: 0.3 10*3/uL (ref 0.1–1.0)
Monocytes Relative: 5 %
NEUTROS ABS: 4.2 10*3/uL (ref 1.7–7.7)
Neutrophils Relative %: 60 %
Platelets: 304 10*3/uL (ref 150–400)
RBC: 4.69 MIL/uL (ref 3.87–5.11)
RDW: 14.5 % (ref 11.5–15.5)
WBC: 7 10*3/uL (ref 4.0–10.5)

## 2016-05-30 LAB — COMPREHENSIVE METABOLIC PANEL
ALBUMIN: 3.9 g/dL (ref 3.5–5.0)
ALT: 19 U/L (ref 14–54)
ANION GAP: 7 (ref 5–15)
AST: 16 U/L (ref 15–41)
Alkaline Phosphatase: 63 U/L (ref 38–126)
BILIRUBIN TOTAL: 0.6 mg/dL (ref 0.3–1.2)
BUN: 8 mg/dL (ref 6–20)
CHLORIDE: 108 mmol/L (ref 101–111)
CO2: 24 mmol/L (ref 22–32)
Calcium: 8.9 mg/dL (ref 8.9–10.3)
Creatinine, Ser: 0.72 mg/dL (ref 0.44–1.00)
GFR calc Af Amer: >60 mL/min (ref >60)
GFR calc non Af Amer: >60 mL/min (ref >60)
GLUCOSE: 96 mg/dL (ref 65–99)
POTASSIUM: 3.5 mmol/L (ref 3.5–5.1)
Sodium: 139 mmol/L (ref 135–145)
TOTAL PROTEIN: 7 g/dL (ref 6.5–8.1)

## 2016-05-30 MED ORDER — BENZONATATE 100 MG PO CAPS: 100.0000 mg | ORAL_CAPSULE | Freq: Three times a day (TID) | ORAL | 0 refills | Status: DC

## 2016-05-30 NOTE — ED Provider Notes (Signed)
[facility] DEPT Provider Note   CSN: [id number] Arrival date & time: 05/30/16  4098  History   Chief Complaint Chief Complaint  Patient presents with  . Cough  . Fever    HPI Carla [name] is a 26 y.o. female.  HPI   Patient to the ER with PMH of recent salmonella and e.coli diarrhea, just finished abx. She has been coughing for two weeks. 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 No weakness, no SOB, no CP, N/V/D.  Past Medical History:  Diagnosis Date  . Allergy   . Anemia   . Depression   . Headache(784.0)   . PONV (postoperative nausea and vomiting)   . Spontaneous abortion 12/03/2010    Patient Active Problem List   Diagnosis Date Noted  . Acute cholecystitis 05/12/2015  . Abdominal pain, lower 11/08/2013    Past Surgical History:  Procedure Laterality Date  . CHOLECYSTECTOMY N/A 05/13/2015   Procedure: LAPAROSCOPIC CHOLECYSTECTOMY ;  Surgeon: Manus Rudd, MD;  Location: [facility] OR;  Service: General;  Laterality: N/A;  . DILATION AND EVACUATION  12/03/2010   Procedure: DILATATION AND EVACUATION (D&E);  Surgeon: Roseanna Rainbow, MD;  Location: [facility];  Service: Gynecology;  Laterality: N/A;  . OVARIAN CYST REMOVAL    . TONSILLECTOMY      OB History    Gravida Para Term Preterm AB Living   4 1 1   3 1    SAB TAB Ectopic Multiple Live Births   3       1       Home Medications    Prior to Admission medications   Medication Sig Start Date End Date Taking? Authorizing Provider  acetaminophen (TYLENOL) 500 MG tablet Take 1,000 mg by mouth every 6 (six) hours as needed for fever. Reported on 11/26/2015    Historical Provider, MD  benzonatate (TESSALON) 100 MG capsule Take 1 capsule (100 mg total) by mouth every 8 (eight) hours. 05/30/16   Miliyah Luper Neva Seat, PA-C  OVER THE COUNTER MEDICATION Place 1 suppository rectally daily as needed (constipation). Reported on 11/26/2015     Historical Provider, MD  PORTIA-28 0.15-30 MG-MCG tablet TAKE 1 TABLET BY MOUTH DAILY 05/05/16   Brock Bad, MD    Family History Family History  Problem Relation Age of Onset  . Hypertension Other   . Diabetes Other   . Stroke Other   . Cancer Other   . Heart failure Other   . Seizures Other     Social History Social History  Substance Use Topics  . Smoking status: Never Smoker  . Smokeless tobacco: Never Used  . Alcohol use 0.0 oz/week     Comment: Ocassionally     Allergies   Shellfish allergy and Latex   Review of Systems Review of Systems  Review of Systems All other systems negative except as documented in the HPI. All pertinent positives and negatives as reviewed in the HPI.  Physical Exam Updated Vital Signs BP 129/83 (BP Location: Right Arm)   Pulse 74   Temp 97.9 F (36.6 C) (Oral)   Resp 18   Ht 5\' 4"  (1.626 m)   Wt 93.9 kg   LMP 05/09/2016 (Approximate)   SpO2 100%   BMI 35.53 kg/m   Physical Exam  Constitutional: She appears well-developed and well-nourished. No distress.  HENT:  Head: Normocephalic and atraumatic.  Right Ear: Tympanic membrane and ear canal normal.  Left Ear: Tympanic membrane and ear canal normal.  Nose: Nose normal.  Mouth/Throat: Uvula is midline, oropharynx is clear and moist and mucous membranes are normal.  Eyes: Pupils are equal, round, and reactive to light.  Neck: Normal range of motion. Neck supple.  Cardiovascular: Normal rate and regular rhythm.   Pulmonary/Chest: Effort normal.  Abdominal: Soft.  No signs of abdominal distention  Musculoskeletal:  No LE swelling  Neurological: She is alert.  Acting at baseline  Skin: Skin is warm and dry. No rash noted.  Nursing note and vitals reviewed.    ED Treatments / Results  Labs (all labs ordered are listed, but only abnormal results are displayed) Labs Reviewed  COMPREHENSIVE METABOLIC PANEL  CBC WITH DIFFERENTIAL/PLATELET    EKG  EKG  Interpretation None       Radiology Dg Chest 2 View  Result Date: 05/30/2016 CLINICAL DATA:  Two weeks of cough with onset of fever today. Nonsmoker. EXAM: CHEST  2 VIEW COMPARISON:  PA chest x-ray of December 17, 2011 FINDINGS: The lungs are adequately inflated and clear. The heart and pulmonary vascularity are normal. The mediastinum is normal in width. There is no pleural effusion. The trachea is midline. The bony thorax exhibits no acute abnormality. IMPRESSION: There is no active cardiopulmonary disease. Electronically Signed   By: David  SwazilandJordan M.D.   On: 05/30/2016 11:02    Procedures Procedures (including critical care time)  Medications Ordered in ED Medications - No data to display   Initial Impression / Assessment and Plan / ED Course  I have reviewed the triage vital signs and the nursing notes.  Pertinent labs & imaging results that were available during my care of the patient were reviewed by me and considered in my medical decision making (see chart for details).  Clinical Course     Pt symptoms consistent with URI. CXR negative for acute infiltrate. Pt will be discharged with symptomatic treatment.  Discussed return precautions.  Pt is hemodynamically stable & in NAD prior to discharge.   Final Clinical Impressions(s) / ED Diagnoses   Final diagnoses:  Upper respiratory tract infection, unspecified type    New Prescriptions New Prescriptions   BENZONATATE (TESSALON) 100 MG CAPSULE    Take 1 capsule (100 mg total) by mouth every 8 (eight) hours.     [name], PA-C 05/30/16 1501    Canary Brimhristopher J Tegeler, MD 05/30/16 2002

## 2016-05-30 NOTE — ED Triage Notes (Signed)
Per Pt, pt is coming from home with fever and chills x2 days. Pt reports she was recently diagnosed with a GI E. Coli Strand infection due to diarrhea and was given 10 days of diarrhea. Pt denies any diarrhea at this time and reports no GI symptoms. Complains of cough x 2 weeks with white sputum.

## 2016-06-16 ENCOUNTER — Other Ambulatory Visit: Payer: Self-pay | Admitting: Obstetrics

## 2016-07-21 ENCOUNTER — Other Ambulatory Visit: Payer: Self-pay | Admitting: Obstetrics

## 2016-08-16 ENCOUNTER — Other Ambulatory Visit: Payer: Self-pay | Admitting: Obstetrics

## 2016-09-16 ENCOUNTER — Other Ambulatory Visit: Payer: Self-pay | Admitting: Obstetrics

## 2016-10-08 ENCOUNTER — Other Ambulatory Visit: Payer: Self-pay | Admitting: Obstetrics

## 2016-12-01 ENCOUNTER — Other Ambulatory Visit: Payer: Self-pay | Admitting: Obstetrics

## 2017-01-13 ENCOUNTER — Inpatient Hospital Stay (HOSPITAL_COMMUNITY)
Admission: AD | Admit: 2017-01-13 | Discharge: 2017-01-13 | Disposition: A | Discharge status: Home / Self Care | Payer: Self-pay | Source: Ambulatory Visit | Attending: Family Medicine | Admitting: Family Medicine

## 2017-01-13 ENCOUNTER — Ambulatory Visit: Payer: 59

## 2017-01-13 ENCOUNTER — Encounter: Payer: Self-pay | Admitting: Family Medicine

## 2017-01-13 ENCOUNTER — Inpatient Hospital Stay (HOSPITAL_COMMUNITY): Payer: Self-pay

## 2017-01-13 ENCOUNTER — Encounter (HOSPITAL_COMMUNITY): Payer: Self-pay | Admitting: *Deleted

## 2017-01-13 ENCOUNTER — Ambulatory Visit (INDEPENDENT_AMBULATORY_CARE_PROVIDER_SITE_OTHER): Payer: 59 | Admitting: Family Medicine

## 2017-01-13 VITALS — BP 119/79 | HR 75 | Temp 98.4°F | Resp 16 | Ht 64.0 in | Wt 203.4 lb

## 2017-01-13 DIAGNOSIS — O468X1 Other antepartum hemorrhage, first trimester: Secondary | ICD-10-CM

## 2017-01-13 DIAGNOSIS — Z3201 Encounter for pregnancy test, result positive: Secondary | ICD-10-CM | POA: Diagnosis not present

## 2017-01-13 DIAGNOSIS — R1032 Left lower quadrant pain: Secondary | ICD-10-CM

## 2017-01-13 DIAGNOSIS — O26891 Other specified pregnancy related conditions, first trimester: Secondary | ICD-10-CM | POA: Insufficient documentation

## 2017-01-13 DIAGNOSIS — O26899 Other specified pregnancy related conditions, unspecified trimester: Secondary | ICD-10-CM

## 2017-01-13 DIAGNOSIS — R112 Nausea with vomiting, unspecified: Secondary | ICD-10-CM

## 2017-01-13 DIAGNOSIS — Z91040 Latex allergy status: Secondary | ICD-10-CM | POA: Insufficient documentation

## 2017-01-13 DIAGNOSIS — O418X11 Other specified disorders of amniotic fluid and membranes, first trimester, fetus 1: Secondary | ICD-10-CM

## 2017-01-13 DIAGNOSIS — Z9049 Acquired absence of other specified parts of digestive tract: Secondary | ICD-10-CM | POA: Insufficient documentation

## 2017-01-13 DIAGNOSIS — Z3A01 Less than 8 weeks gestation of pregnancy: Secondary | ICD-10-CM | POA: Insufficient documentation

## 2017-01-13 DIAGNOSIS — Z8759 Personal history of other complications of pregnancy, childbirth and the puerperium: Secondary | ICD-10-CM | POA: Insufficient documentation

## 2017-01-13 DIAGNOSIS — O208 Other hemorrhage in early pregnancy: Secondary | ICD-10-CM | POA: Insufficient documentation

## 2017-01-13 DIAGNOSIS — Z9889 Other specified postprocedural states: Secondary | ICD-10-CM | POA: Insufficient documentation

## 2017-01-13 DIAGNOSIS — Z3490 Encounter for supervision of normal pregnancy, unspecified, unspecified trimester: Secondary | ICD-10-CM

## 2017-01-13 DIAGNOSIS — O210 Mild hyperemesis gravidarum: Secondary | ICD-10-CM | POA: Insufficient documentation

## 2017-01-13 DIAGNOSIS — R109 Unspecified abdominal pain: Secondary | ICD-10-CM | POA: Insufficient documentation

## 2017-01-13 DIAGNOSIS — Z91013 Allergy to seafood: Secondary | ICD-10-CM | POA: Insufficient documentation

## 2017-01-13 LAB — POCT CBC
Granulocyte percent: 62.3 %G (ref 37–80)
HCT, POC: 39.2 % (ref 37.7–47.9)
Hemoglobin: 13.4 g/dL (ref 12.2–16.2)
Lymph, poc: 3.1 (ref 0.6–3.4)
MCH, POC: 29.5 pg (ref 27–31.2)
MCHC: 34.2 g/dL (ref 31.8–35.4)
MCV: 86.1 fL (ref 80–97)
MID (cbc): 0.7 (ref 0–0.9)
MPV: 6.7 fL (ref 0–99.8)
POC Granulocyte: 6.2 (ref 2–6.9)
POC LYMPH PERCENT: 31 %L (ref 10–50)
POC MID %: 6.7 %M (ref 0–12)
Platelet Count, POC: 281 10*3/uL (ref 142–424)
RBC: 4.56 M/uL (ref 4.04–5.48)
RDW, POC: 13.7 %
WBC: 9.9 10*3/uL (ref 4.6–10.2)

## 2017-01-13 LAB — COMPREHENSIVE METABOLIC PANEL
ALBUMIN: 4 g/dL (ref 3.5–5.0)
ALT: 19 U/L (ref 14–54)
ANION GAP: 8 (ref 5–15)
AST: 18 U/L (ref 15–41)
Alkaline Phosphatase: 76 U/L (ref 38–126)
BUN: 9 mg/dL (ref 6–20)
CHLORIDE: 105 mmol/L (ref 101–111)
CO2: 24 mmol/L (ref 22–32)
Calcium: 8.9 mg/dL (ref 8.9–10.3)
Creatinine, Ser: 0.64 mg/dL (ref 0.44–1.00)
GFR calc Af Amer: >60 mL/min (ref >60)
GFR calc non Af Amer: >60 mL/min (ref >60)
GLUCOSE: 92 mg/dL (ref 65–99)
POTASSIUM: 3.6 mmol/L (ref 3.5–5.1)
Sodium: 137 mmol/L (ref 135–145)
TOTAL PROTEIN: 7.3 g/dL (ref 6.5–8.1)
Total Bilirubin: 0.8 mg/dL (ref 0.3–1.2)

## 2017-01-13 LAB — POCT URINALYSIS DIP (MANUAL ENTRY)
Glucose, UA: NEGATIVE mg/dL
Leukocytes, UA: NEGATIVE
Nitrite, UA: NEGATIVE
Protein Ur, POC: 30 mg/dL — AB
Spec Grav, UA: >=1.03 — AB (ref 1.010–1.025)
Urobilinogen, UA: 0.2 E.U./dL
pH, UA: 6 (ref 5.0–8.0)

## 2017-01-13 LAB — POC MICROSCOPIC URINALYSIS (UMFC): Mucus: ABSENT

## 2017-01-13 LAB — POCT URINE PREGNANCY: Preg Test, Ur: POSITIVE — AB

## 2017-01-13 LAB — HCG, QUANTITATIVE, PREGNANCY: hCG, Beta Chain, Quant, S: 64810 m[IU]/mL — ABNORMAL HIGH (ref <5)

## 2017-01-13 LAB — LIPASE, BLOOD: Lipase: 25 U/L (ref 11–51)

## 2017-01-13 MED ORDER — PROMETHAZINE HCL 25 MG PO TABS
25.0000 mg | ORAL_TABLET | Freq: Four times a day (QID) | ORAL | 1 refills | Status: DC | PRN
Start: 2017-01-13 — End: 2017-01-30

## 2017-01-13 MED ORDER — SODIUM CHLORIDE 0.9 % IV BOLUS (SEPSIS): 1000.0000 mL | Freq: Once | INTRAVENOUS | Status: DC

## 2017-01-13 NOTE — MAU Note (Signed)
Pt arrives via EMS from [facility]. States she presented there this am with abd pain and had a positive preg test. Sent here to eval ? Ectopic pregnancy.

## 2017-01-13 NOTE — Progress Notes (Signed)
8/24/20182:21 PM  Carla [name] [birth year], 26 y.o. female [id number]  Chief Complaint  Patient presents with  . Nausea    x3 days  . Emesis    x3 days  . Abdominal Pain    x3 days    HPI:   Patient is a 26 y.o. female who presents today for 3 days of abdominal pain, cramping, sharp with intractable nausea and vomiting despite taking zofran and phenegran at home. On OCPs,denies any interruptions. However has been pregnant twice on these with SABs. LMP unknown. Abdominal pain epigastric that radiates to left back, and LLQ. Not relieved with gas x, tums. Denies fevers, reports chills and malaise. Denies blood in emesis or stool. Reports bloating, belching, last stool about a week ago, long standing h/o constipation. H/o hyperemesis gravidum with last viable pregnancy 3 years ago. S/p chole. Still has appendix.  Depression screen [facility] 2/9 01/13/2017 03/17/2016 11/26/2015  Decreased Interest 0 0 0  Down, Depressed, Hopeless 0 0 0  PHQ - 2 Score 0 0 0    Allergies  Allergen Reactions  . Shellfish Allergy Anaphylaxis  . Latex Itching    No current facility-administered medications on file prior to visit.    Current Outpatient Prescriptions on File Prior to Visit  Medication Sig Dispense Refill  . ALTAVERA 0.15-30 MG-MCG tablet TAKE 1 TABLET BY MOUTH DAILY 28 tablet 0  . acetaminophen (TYLENOL) 500 MG tablet Take 1,000 mg by mouth every 6 (six) hours as needed for fever. Reported on 11/26/2015      Past Medical History:  Diagnosis Date  . Allergy   . Anemia   . Depression   . Headache(784.0)   . PONV (postoperative nausea and vomiting)   . Spontaneous abortion 12/03/2010    Past Surgical History:  Procedure Laterality Date  . CHOLECYSTECTOMY N/A 05/13/2015   Procedure: LAPAROSCOPIC CHOLECYSTECTOMY ;  Surgeon: Manus Rudd, MD;  Location: [facility] OR;  Service: General;  Laterality: N/A;  . DILATION AND EVACUATION  12/03/2010   Procedure: DILATATION AND EVACUATION (D&E);   Surgeon: Roseanna Rainbow, MD;  Location: [facility];  Service: Gynecology;  Laterality: N/A;  . OVARIAN CYST REMOVAL    . TONSILLECTOMY      Social History  Substance Use Topics  . Smoking status: Never Smoker  . Smokeless tobacco: Never Used  . Alcohol use 0.0 oz/week     Comment: Ocassionally    Family History  Problem Relation Age of Onset  . Hypertension Other   . Diabetes Other   . Stroke Other   . Cancer Other   . Heart failure Other   . Seizures Other     Review of Systems  Constitutional: Positive for chills and malaise/fatigue. Negative for fever.  Respiratory: Negative for cough and shortness of breath.   Cardiovascular: Negative for chest pain, palpitations and leg swelling.  Gastrointestinal: Positive for abdominal pain, constipation, heartburn, nausea and vomiting. Negative for diarrhea.     OBJECTIVE:  Blood pressure 119/79, pulse 75, temperature 98.4 F (36.9 C), temperature source Oral, resp. rate 16, height 5\' 4"  (1.626 m), weight 203 lb 6.4 oz (92.3 kg), last menstrual period 11/13/2016, SpO2 98 %.  Physical Exam  Constitutional: She is oriented to person, place, and time and well-developed, well-nourished, and in no distress.  HENT:  Head: Normocephalic and atraumatic.  Mouth/Throat: Oropharynx is clear and moist. No oropharyngeal exudate.  Eyes: Pupils are equal, round, and reactive to light. EOM are normal. No scleral icterus.  Neck: Neck supple.  Cardiovascular: Normal rate, regular rhythm and normal heart sounds.  Exam reveals no gallop and no friction rub.   No murmur heard. Pulmonary/Chest: Effort normal and breath sounds normal. She has no wheezes. She has no rales.  Abdominal: Soft. She exhibits no distension. Bowel sounds are hypoactive. There is tenderness in the epigastric area and left lower quadrant. There is rebound (LLQ) and CVA tenderness (LEFT).  Musculoskeletal: She exhibits no edema.  Neurological: She is alert and oriented to  person, place, and time. Gait normal.  Skin: Skin is warm and dry. She is not diaphoretic.    Results for orders placed or performed in visit on 01/13/17  POCT urine pregnancy  Result Value Ref Range   Preg Test, Ur Positive (A) Negative  POCT urinalysis dipstick  Result Value Ref Range   Color, UA yellow yellow   Clarity, UA clear clear   Glucose, UA negative negative mg/dL   Bilirubin, UA small (A) negative   Ketones, POC UA trace (5) (A) negative mg/dL   Spec Grav, UA >=6.045 (A) 1.010 - 1.025   Blood, UA moderate (A) negative   pH, UA 6.0 5.0 - 8.0   Protein Ur, POC =30 (A) negative mg/dL   Urobilinogen, UA 0.2 0.2 or 1.0 E.U./dL   Nitrite, UA Negative Negative   Leukocytes, UA Negative Negative  POCT Microscopic Urinalysis ([facility])  Result Value Ref Range   WBC,UR,HPF,POC None None WBC/hpf   RBC,UR,HPF,POC Few (A) None RBC/hpf   Bacteria None None, Too numerous to count   Mucus Absent Absent   Epithelial Cells, UR Per Microscopy Few (A) None, Too numerous to count cells/hpf  POCT CBC  Result Value Ref Range   WBC 9.9 4.6 - 10.2 K/uL   Lymph, poc 3.1 0.6 - 3.4   POC LYMPH PERCENT 31.0 10 - 50 %L   MID (cbc) 0.7 0 - 0.9   POC MID % 6.7 0 - 12 %M   POC Granulocyte 6.2 2 - 6.9   Granulocyte percent 62.3 37 - 80 %G   RBC 4.56 4.04 - 5.48 M/uL   Hemoglobin 13.4 12.2 - 16.2 g/dL   HCT, POC 40.9 81.1 - 47.9 %   MCV 86.1 80 - 97 fL   MCH, POC 29.5 27 - 31.2 pg   MCHC 34.2 31.8 - 35.4 g/dL   RDW, POC 91.4 %   Platelet Count, POC 281 142 - 424 K/uL   MPV 6.7 0 - 99.8 fL     ASSESSMENT and PLAN:  1. Nausea and vomiting, intractability of vomiting not specified, unspecified vomiting type Patient with normal vitals and WBC but concerning exam with LLQ pain w rebound. DDX discussed with patient, most concerned about developing ectopic, appy, bowel obstruction. Needs to be evaluated in ER setting. Transferred via EMS given unknown etiology with possible life threatening  consequences. - POCT urine pregnancy - POCT urinalysis dipstick - POCT Microscopic Urinalysis ([facility]) - POCT CBC - Comprehensive metabolic panel - hCG, quantitative, pregnancy - Insert peripheral IV - sodium chloride 0.9 % bolus 1,000 mL; Inject 1,000 mLs into the vein once.  2. Pregnancy test performed, pregnancy confirmed Urine positive, ordering quant as will be needed in further evaluation   3. LLQ abdominal pain See above        Myles Lipps, MD Primary [facility] at [facility] [address] [place], [place] [address] Ph.  [phone] Fax [phone]

## 2017-01-13 NOTE — Discharge Instructions (Signed)
Stop taking birth control Take prenatal vitamin and set up OB care Nausea medicine prescribed   First Trimester of Pregnancy The first trimester of pregnancy is from week 1 until the end of week 13 (months 1 through 3). During this time, your baby will begin to develop inside you. At 6-8 weeks, the eyes and face are formed, and the heartbeat can be seen on ultrasound. At the end of 12 weeks, all the baby's organs are formed. Prenatal care is all the medical care you receive before the birth of your baby. Make sure you get good prenatal care and follow all of your doctor's instructions. Follow these instructions at home: Medicines  Take over-the-counter and prescription medicines only as told by your doctor. Some medicines are safe and some medicines are not safe during pregnancy.  Take a prenatal vitamin that contains at least 600 micrograms (mcg) of folic acid.  If you have trouble pooping (constipation), take medicine that will make your stool soft (stool softener) if your doctor approves. Eating and drinking  Eat regular, healthy meals.  Your doctor will tell you the amount of weight gain that is right for you.  Avoid raw meat and uncooked cheese.  If you feel sick to your stomach (nauseous) or throw up (vomit): ? Eat 4 or 5 small meals a day instead of 3 large meals. ? Try eating a few soda crackers. ? Drink liquids between meals instead of during meals.  To prevent constipation: ? Eat foods that are high in fiber, like fresh fruits and vegetables, whole grains, and beans. ? Drink enough fluids to keep your pee (urine) clear or pale yellow. Activity  Exercise only as told by your doctor. Stop exercising if you have cramps or pain in your lower belly (abdomen) or low back.  Do not exercise if it is too hot, too humid, or if you are in a place of great height (high altitude).  Try to avoid standing for long periods of time. Move your legs often if you must stand in one place  for a long time.  Avoid heavy lifting.  Wear low-heeled shoes. Sit and stand up straight.  You can have sex unless your doctor tells you not to. Relieving pain and discomfort  Wear a good support bra if your breasts are sore.  Take warm water baths (sitz baths) to soothe pain or discomfort caused by hemorrhoids. Use hemorrhoid cream if your doctor says it is okay.  Rest with your legs raised if you have leg cramps or low back pain.  If you have puffy, bulging veins (varicose veins) in your legs: ? Wear support hose or compression stockings as told by your doctor. ? Raise (elevate) your feet for 15 minutes, 3-4 times a day. ? Limit salt in your food. Prenatal care  Schedule your prenatal visits by the twelfth week of pregnancy.  Write down your questions. Take them to your prenatal visits.  Keep all your prenatal visits as told by your doctor. This is important. Safety  Wear your seat belt at all times when driving.  Make a list of emergency phone numbers. The list should include numbers for family, friends, the hospital, and police and fire departments. General instructions  Ask your doctor for a referral to a local prenatal class. Begin classes no later than at the start of month 6 of your pregnancy.  Ask for help if you need counseling or if you need help with nutrition. Your doctor can give you advice  or tell you where to go for help.  Do not use hot tubs, steam rooms, or saunas.  Do not douche or use tampons or scented sanitary pads.  Do not cross your legs for long periods of time.  Avoid all herbs and alcohol. Avoid drugs that are not approved by your doctor.  Do not use any tobacco products, including cigarettes, chewing tobacco, and electronic cigarettes. If you need help quitting, ask your doctor. You may get counseling or other support to help you quit.  Avoid cat litter boxes and soil used by cats. These carry germs that can cause birth defects in the baby  and can cause a loss of your baby (miscarriage) or stillbirth.  Visit your dentist. At home, brush your teeth with a soft toothbrush. Be gentle when you floss. Contact a doctor if:  You are dizzy.  You have mild cramps or pressure in your lower belly.  You have a nagging pain in your belly area.  You continue to feel sick to your stomach, you throw up, or you have watery poop (diarrhea).  You have a bad smelling fluid coming from your vagina.  You have pain when you pee (urinate).  You have increased puffiness (swelling) in your face, hands, legs, or ankles. Get help right away if:  You have a fever.  You are leaking fluid from your vagina.  You have spotting or bleeding from your vagina.  You have very bad belly cramping or pain.  You gain or lose weight rapidly.  You throw up blood. It may look like coffee grounds.  You are around people who have [place] measles, fifth disease, or chickenpox.  You have a very bad headache.  You have shortness of breath.  You have any kind of trauma, such as from a fall or a car accident. Summary  The first trimester of pregnancy is from week 1 until the end of week 13 (months 1 through 3).  To take care of yourself and your unborn baby, you will need to eat healthy meals, take medicines only if your doctor tells you to do so, and do activities that are safe for you and your baby.  Keep all follow-up visits as told by your doctor. This is important as your doctor will have to ensure that your baby is healthy and growing well. This information is not intended to replace advice given to you by your health care provider. Make sure you discuss any questions you have with your health care provider. Document Released: 10/26/2007 Document Revised: 05/17/2016 Document Reviewed: 05/17/2016 Elsevier Interactive Patient Education  2017 [employer].

## 2017-01-13 NOTE — MAU Provider Note (Signed)
Chief Complaint: Abdominal Pain  SUBJECTIVE HPI: Carla [name] is a 26 y.o. [phone] at Unknown who presents to [facility]U from urgent care clinic with abdominal pain associated with n/v. Symptoms started on Wednesday. Tried taking her antiemetics without relief. Pain is continuous and centrally located. Can keep food and drink down for 10-20 minutes but then it comes back up.   At urgent care center patient noted to have positive urine pregnancy test. Was sent over for further evaluation due to concern for possible ectopic pregnancy. Patient states she has been on birth control pills for years. Had recent switch in brand of OCP 4 months ago and since then she has had two other positive pregnancy test which ended in miscarriage.   Denies vaginal bleeding, reflux, blood in vomit, fevers, chills, dysuria.  Past Medical History:  Diagnosis Date  . Allergy   . Anemia   . Depression   . Headache(784.0)   . PONV (postoperative nausea and vomiting)   . Spontaneous abortion 12/03/2010   OB History  Gravida Para Term Preterm AB Living  5 1 1   3 1   SAB TAB Ectopic Multiple Live Births  3       1    # Outcome Date GA Lbr Len/2nd Weight Sex Delivery Anes PTL Lv  5 Current           4 Term 05/25/13 [redacted]w[redacted]d 11:01 / 01:14 6 lb 12.8 oz (3.085 kg) F Vag-Spont EPI  LIV  3 SAB           2 SAB           1 SAB              Past Surgical History:  Procedure Laterality Date  . CHOLECYSTECTOMY N/A 05/13/2015   Procedure: LAPAROSCOPIC CHOLECYSTECTOMY ;  Surgeon: Manus Rudd, MD;  Location: [facility];  Service: General;  Laterality: N/A;  . DILATION AND EVACUATION  12/03/2010   Procedure: DILATATION AND EVACUATION (D&E);  Surgeon: Roseanna Rainbow, MD;  Location: [facility];  Service: Gynecology;  Laterality: N/A;  . OVARIAN CYST REMOVAL    . TONSILLECTOMY     Social History   Social History  . Marital status: Married    Spouse name: N/A  . Number of children: N/A  . Years of education: N/A    Occupational History  . Not on file.   Social History Main Topics  . Smoking status: Never Smoker  . Smokeless tobacco: Never Used  . Alcohol use 0.0 oz/week     Comment: Ocassionally  . Drug use: No  . Sexual activity: Yes    Partners: Male    Birth control/ protection: Pill   Other Topics Concern  . Not on file   Social History Narrative  . No narrative on file   No current facility-administered medications on file prior to encounter.    Current Outpatient Prescriptions on File Prior to Encounter  Medication Sig Dispense Refill  . acetaminophen (TYLENOL) 500 MG tablet Take 1,000 mg by mouth every 6 (six) hours as needed for fever. Reported on 11/26/2015    . ALTAVERA 0.15-30 MG-MCG tablet TAKE 1 TABLET BY MOUTH DAILY 28 tablet 0   Allergies  Allergen Reactions  . Shellfish Allergy Anaphylaxis  . Latex Itching    I have reviewed the past Medical Hx, Surgical Hx, Social Hx, Allergies and Medications.   REVIEW OF SYSTEMS All systems reviewed and are negative for acute change except as noted in  the HPI.   OBJECTIVE BP (!) 148/99 (BP Location: Left Arm)   Pulse 83   Temp (!) 97.3 F (36.3 C) (Oral)   Resp 17   Ht 5\' 4"  (1.626 m)   Wt 203 lb (92.1 kg)   LMP 11/13/2016 (Approximate)   SpO2 100%   BMI 34.84 kg/m   PHYSICAL EXAM Constitutional: Well-developed, well-nourished female in no acute distress.  Cardiovascular: RRR, pulses intact. Brisk cap refill.  Respiratory: normal rate and effort.  GI: Abd soft, epigastric tenderness, no guarding, no rebound, non-distended. Pos BS x 4 MS: Extremities nontender, no edema, normal ROM Neurologic: Alert and oriented x 4.  GU: Neg CVAT.  LAB RESULTS Results for orders placed or performed during the hospital encounter of 01/13/17 (from the past 24 hour(s))  hCG, quantitative, pregnancy     Status: Abnormal   Collection Time: 01/13/17  2:17 PM  Result Value Ref Range   hCG, Beta Chain, Quant, S 64,810 (H) <5 mIU/mL   Lipase, blood     Status: None   Collection Time: 01/13/17  2:17 PM  Result Value Ref Range   Lipase 25 11 - 51 U/L  Comprehensive metabolic panel     Status: None   Collection Time: 01/13/17  2:17 PM  Result Value Ref Range   Sodium 137 135 - 145 mmol/L   Potassium 3.6 3.5 - 5.1 mmol/L   Chloride 105 101 - 111 mmol/L   CO2 24 22 - 32 mmol/L   Glucose, Bld 92 65 - 99 mg/dL   BUN 9 6 - 20 mg/dL   Creatinine, Ser 1.61 0.44 - 1.00 mg/dL   Calcium 8.9 8.9 - 09.6 mg/dL   Total Protein 7.3 6.5 - 8.1 g/dL   Albumin 4.0 3.5 - 5.0 g/dL   AST 18 15 - 41 U/L   ALT 19 14 - 54 U/L   Alkaline Phosphatase 76 38 - 126 U/L   Total Bilirubin 0.8 0.3 - 1.2 mg/dL   GFR calc non Af Amer >60 >60 mL/min   GFR calc Af Amer >60 >60 mL/min   Anion gap 8 5 - 15    IMAGING US Ob Comp Less 14 Wks  Result Date: 01/13/2017 CLINICAL DATA:  Pregnant patient with abdominal pain. EXAM: OBSTETRIC <14 WK [place] AND TRANSVAGINAL OB US TECHNIQUE: Both transabdominal and transvaginal ultrasound examinations were performed for complete evaluation of the gestation as well as the maternal uterus, adnexal regions, and pelvic cul-de-sac. Transvaginal technique was performed to assess early pregnancy. COMPARISON:  None. FINDINGS: Intrauterine gestational sac: Single Yolk sac:  Visualized. Embryo:  Visualized. Cardiac Activity: Visualized. Heart Rate: 118  bpm CRL:  3.5  mm   5 w   6 d                  [place] EDC: 09/09/2017 Subchorionic hemorrhage:  Moderate subchorionic hemorrhage Maternal uterus/adnexae: Corpus luteum left ovary. Normal right ovary. No free fluid in the pelvis. IMPRESSION: Single live intrauterine gestation. Moderate subchorionic hemorrhage. Electronically Signed   By: Annia Belt M.D.   On: 01/13/2017 15:23   US Ob Transvaginal  Result Date: 01/13/2017 CLINICAL DATA:  Pregnant patient with abdominal pain. EXAM: OBSTETRIC <14 WK [place] AND TRANSVAGINAL OB US TECHNIQUE: Both transabdominal and transvaginal ultrasound  examinations were performed for complete evaluation of the gestation as well as the maternal uterus, adnexal regions, and pelvic cul-de-sac. Transvaginal technique was performed to assess early pregnancy. COMPARISON:  None. FINDINGS: Intrauterine gestational sac: Single  Yolk sac:  Visualized. Embryo:  Visualized. Cardiac Activity: Visualized. Heart Rate: 118  bpm CRL:  3.5  mm   5 w   6 d                  [place] EDC: 09/09/2017 Subchorionic hemorrhage:  Moderate subchorionic hemorrhage Maternal uterus/adnexae: Corpus luteum left ovary. Normal right ovary. No free fluid in the pelvis. IMPRESSION: Single live intrauterine gestation. Moderate subchorionic hemorrhage. Electronically Signed   By: Annia Belt M.D.   On: 01/13/2017 15:23    MAU COURSE Vitals stable [place] confirming intrauterine pregnancy Bhcg No signs of electrolyte disturbance Lipase normal - r/o pancreatitis  IV fluid bolus  MDM Plan of care reviewed with patient, including labs and tests ordered and medical treatment.   ASSESSMENT 1. Morning sickness   2. Abdominal pain during pregnancy in first trimester   3. Intrauterine pregnancy   4. Subchorionic hemorrhage of placenta in first trimester, fetus 1 of multiple gestation   5. Abdominal pain affecting pregnancy    Believe symptoms are all due to morning sickness from pregnancy. Patient has h/o hyperemesis gravidum in prior pregnancy. Extensive h/o miscarriage and second trimester losses.   PLAN Encouraged setting up prenatal care Rx for phenergan given Work note given Discontinue OCP and start PNV Miscarriage precautions and bleeding precautions given Discharge home in stable condition Handout given   Caryl Ada, DO [facility], [facility] [place] 01/13/2017, 2:00 PM

## 2017-01-14 LAB — COMPREHENSIVE METABOLIC PANEL
ALT: 15 IU/L (ref 0–32)
AST: 17 IU/L (ref 0–40)
Albumin/Globulin Ratio: 2 (ref 1.2–2.2)
Albumin: 4.7 g/dL (ref 3.5–5.5)
Alkaline Phosphatase: 89 IU/L (ref 39–117)
BUN/Creatinine Ratio: 12 (ref 9–23)
BUN: 9 mg/dL (ref 6–20)
Bilirubin Total: 0.6 mg/dL (ref 0.0–1.2)
CO2: 19 mmol/L — ABNORMAL LOW (ref 20–29)
Calcium: 9.4 mg/dL (ref 8.7–10.2)
Chloride: 102 mmol/L (ref 96–106)
Creatinine, Ser: 0.73 mg/dL (ref 0.57–1.00)
GFR calc Af Amer: 130 mL/min/{1.73_m2} (ref >59)
GFR calc non Af Amer: 112 mL/min/{1.73_m2} (ref >59)
Globulin, Total: 2.3 g/dL (ref 1.5–4.5)
Glucose: 72 mg/dL (ref 65–99)
Potassium: 3.4 mmol/L — ABNORMAL LOW (ref 3.5–5.2)
Sodium: 140 mmol/L (ref 134–144)
Total Protein: 7 g/dL (ref 6.0–8.5)

## 2017-01-16 ENCOUNTER — Inpatient Hospital Stay (HOSPITAL_COMMUNITY): Payer: 59

## 2017-01-16 ENCOUNTER — Inpatient Hospital Stay (HOSPITAL_COMMUNITY)
Admission: AD | Admit: 2017-01-16 | Discharge: 2017-01-16 | Disposition: A | Discharge status: Home / Self Care | Payer: 59 | Source: Ambulatory Visit | Attending: Obstetrics & Gynecology | Admitting: Obstetrics & Gynecology

## 2017-01-16 ENCOUNTER — Encounter (HOSPITAL_COMMUNITY): Payer: Self-pay | Admitting: *Deleted

## 2017-01-16 DIAGNOSIS — Z91013 Allergy to seafood: Secondary | ICD-10-CM | POA: Diagnosis not present

## 2017-01-16 DIAGNOSIS — O26891 Other specified pregnancy related conditions, first trimester: Secondary | ICD-10-CM | POA: Diagnosis not present

## 2017-01-16 DIAGNOSIS — R1030 Lower abdominal pain, unspecified: Secondary | ICD-10-CM | POA: Diagnosis not present

## 2017-01-16 DIAGNOSIS — Z9049 Acquired absence of other specified parts of digestive tract: Secondary | ICD-10-CM | POA: Insufficient documentation

## 2017-01-16 DIAGNOSIS — O208 Other hemorrhage in early pregnancy: Secondary | ICD-10-CM | POA: Insufficient documentation

## 2017-01-16 DIAGNOSIS — Z91040 Latex allergy status: Secondary | ICD-10-CM | POA: Insufficient documentation

## 2017-01-16 DIAGNOSIS — O209 Hemorrhage in early pregnancy, unspecified: Secondary | ICD-10-CM

## 2017-01-16 DIAGNOSIS — Z9889 Other specified postprocedural states: Secondary | ICD-10-CM | POA: Diagnosis not present

## 2017-01-16 DIAGNOSIS — O418X10 Other specified disorders of amniotic fluid and membranes, first trimester, not applicable or unspecified: Secondary | ICD-10-CM

## 2017-01-16 DIAGNOSIS — Z833 Family history of diabetes mellitus: Secondary | ICD-10-CM | POA: Insufficient documentation

## 2017-01-16 DIAGNOSIS — Z823 Family history of stroke: Secondary | ICD-10-CM | POA: Insufficient documentation

## 2017-01-16 DIAGNOSIS — Z8249 Family history of ischemic heart disease and other diseases of the circulatory system: Secondary | ICD-10-CM | POA: Insufficient documentation

## 2017-01-16 DIAGNOSIS — Z3A01 Less than 8 weeks gestation of pregnancy: Secondary | ICD-10-CM | POA: Diagnosis not present

## 2017-01-16 DIAGNOSIS — F329 Major depressive disorder, single episode, unspecified: Secondary | ICD-10-CM | POA: Insufficient documentation

## 2017-01-16 DIAGNOSIS — O468X1 Other antepartum hemorrhage, first trimester: Secondary | ICD-10-CM | POA: Diagnosis not present

## 2017-01-16 DIAGNOSIS — Z79899 Other long term (current) drug therapy: Secondary | ICD-10-CM | POA: Diagnosis not present

## 2017-01-16 LAB — WET PREP, GENITAL
CLUE CELLS WET PREP: NONE SEEN
SPERM: NONE SEEN
TRICH WET PREP: NONE SEEN
Yeast Wet Prep HPF POC: NONE SEEN

## 2017-01-16 LAB — URINALYSIS, ROUTINE W REFLEX MICROSCOPIC
BILIRUBIN URINE: NEGATIVE
Glucose, UA: NEGATIVE mg/dL
Ketones, ur: NEGATIVE mg/dL
LEUKOCYTES UA: NEGATIVE
Nitrite: NEGATIVE
Protein, ur: NEGATIVE mg/dL
Specific Gravity, Urine: 1.018 (ref 1.005–1.030)
pH: 6 (ref 5.0–8.0)

## 2017-01-16 NOTE — Discharge Instructions (Signed)
Pelvic Rest °Pelvic rest may be recommended if: °· Your placenta is partially or completely covering the opening of your cervix (placenta previa). °· There is bleeding between the wall of the uterus and the amniotic sac in the first trimester of pregnancy (subchorionic hemorrhage). °· You went into labor too early (preterm labor). ° °Based on your overall health and the health of your baby, your health care provider will decide if pelvic rest is right for you. °How do I rest my pelvis? °For as long as told by your health care provider: °· Do not have sex, sexual stimulation, or an orgasm. °· Do not use tampons. Do not douche. Do not put anything in your vagina. °· Do not lift anything that is heavier than 10 lb (4.5 kg). °· Avoid activities that take a lot of effort (are strenuous). °· Avoid any activity in which your pelvic muscles could become strained. ° °When should I seek medical care? °Seek medical care if you have: °· Cramping pain in your lower abdomen. °· Vaginal discharge. °· A low, dull backache. °· Regular contractions. °· Uterine tightening. ° °When should I seek immediate medical care? °Seek immediate medical care if: °· You have vaginal bleeding and you are pregnant. ° °This information is not intended to replace advice given to you by your health care provider. Make sure you discuss any questions you have with your health care provider. °Document Released: 09/03/2010 Document Revised: 10/15/2015 Document Reviewed: 11/10/2014 °Elsevier Interactive Patient Education © 2018 [employer] ° °Subchorionic Hematoma °A subchorionic hematoma is a gathering of blood between the outer wall of the placenta and the inner wall of the womb (uterus). The placenta is the organ that connects the fetus to the wall of the uterus. The placenta performs the feeding, breathing (oxygen to the fetus), and waste removal (excretory work) of the fetus. °Subchorionic hematoma is the most common abnormality found on a result  from ultrasonography done during the first trimester or early second trimester of pregnancy. If there has been little or no vaginal bleeding, early small hematomas usually shrink on their own and do not affect your baby or pregnancy. The blood is gradually absorbed over 1-2 weeks. When bleeding starts later in pregnancy or the hematoma is larger or occurs in an older pregnant woman, the outcome may not be as good. Larger hematomas may get bigger, which increases the chances for miscarriage. Subchorionic hematoma also increases the risk of premature detachment of the placenta from the uterus, preterm (premature) labor, and stillbirth. °Follow these instructions at home: °· Stay on bed rest if your health care provider recommends this. Although bed rest will not prevent more bleeding or prevent a miscarriage, your health care provider may recommend bed rest until you are advised otherwise. °· Avoid heavy lifting (more than 10 lb [4.5 kg]), exercise, sexual intercourse, or douching as directed by your health care provider. °· Keep track of the number of pads you use each day and how soaked (saturated) they are. Write down this information. °· Do not use tampons. °· Keep all follow-up appointments as directed by your health care provider. Your health care provider may ask you to have follow-up blood tests or ultrasound tests or both. °Get help right away if: °· You have severe cramps in your stomach, back, abdomen, or pelvis. °· You have a fever. °· You pass large clots or tissue. Save any tissue for your health care provider to look at. °· Your bleeding increases or you become lightheaded,   feel weak, or have fainting episodes. °This information is not intended to replace advice given to you by your health care provider. Make sure you discuss any questions you have with your health care provider. °Document Released: 08/24/2006 Document Revised: 10/15/2015 Document Reviewed: 12/06/2012 °Elsevier Interactive Patient  Education © 2017 Elsevier Inc. ° °

## 2017-01-16 NOTE — MAU Provider Note (Signed)
History     CSN: [id number]  Arrival date and time: 01/16/17 1255   First Provider Initiated Contact with Patient 01/16/17 1508      Chief Complaint  Patient presents with  . Vaginal Bleeding  . Abdominal Pain   HPI   Ms.Carla [name] is a 26 y.o. female [phone] @ [redacted]w[redacted]d with a history of miscarriage here in MAU with vaginal bleeding. The bleeding started this morning. The bleeding is noted only when she wipes and is bright red/pink. She is also having cramping in her lower abdomen that started on 8/25. States she has had 3 miscarriages and is worried.   OB History    Gravida Para Term Preterm AB Living   5 1 1   3 1    SAB TAB Ectopic Multiple Live Births   3       1      Past Medical History:  Diagnosis Date  . Allergy   . Anemia   . Depression   . Headache(784.0)   . PONV (postoperative nausea and vomiting)   . Spontaneous abortion 12/03/2010    Past Surgical History:  Procedure Laterality Date  . CHOLECYSTECTOMY N/A 05/13/2015   Procedure: LAPAROSCOPIC CHOLECYSTECTOMY ;  Surgeon: Manus Rudd, MD;  Location: [facility] OR;  Service: General;  Laterality: N/A;  . DILATION AND EVACUATION  12/03/2010   Procedure: DILATATION AND EVACUATION (D&E);  Surgeon: Roseanna Rainbow, MD;  Location: [facility];  Service: Gynecology;  Laterality: N/A;  . OVARIAN CYST REMOVAL    . TONSILLECTOMY      Family History  Problem Relation Age of Onset  . Hypertension Other   . Diabetes Other   . Stroke Other   . Cancer Other   . Heart failure Other   . Seizures Other     Social History  Substance Use Topics  . Smoking status: Never Smoker  . Smokeless tobacco: Never Used  . Alcohol use 0.0 oz/week     Comment: Ocassionally    Allergies:  Allergies  Allergen Reactions  . Shellfish Allergy Anaphylaxis  . Latex Itching    Prescriptions Prior to Admission  Medication Sig Dispense Refill Last Dose  . acetaminophen (TYLENOL) 500 MG tablet Take 1,000 mg by mouth every 6 (six)  hours as needed for moderate pain or fever. Reported on 11/26/2015   Past Week at Unknown time  . ondansetron (ZOFRAN-ODT) 8 MG disintegrating tablet Take 8 mg by mouth every 8 (eight) hours as needed for nausea or vomiting.   01/13/2017 at Unknown time  . promethazine (PHENERGAN) 25 MG tablet Take 1 tablet (25 mg total) by mouth every 6 (six) hours as needed for nausea or vomiting. 30 tablet 1   . promethazine (PHENERGAN) 25 MG tablet Take 25 mg by mouth every 6 (six) hours as needed for nausea or vomiting.   Past Week at Unknown time  . Simethicone (GAS-X PO) Take 1 tablet by mouth 2 (two) times daily as needed.   01/12/2017 at Unknown time   Results for orders placed or performed during the hospital encounter of 01/16/17 (from the past 48 hour(s))  Urinalysis, Routine w reflex microscopic     Status: Abnormal   Collection Time: 01/16/17  2:08 PM  Result Value Ref Range   Color, Urine YELLOW YELLOW   APPearance HAZY (A) CLEAR   Specific Gravity, Urine 1.018 1.005 - 1.030   pH 6.0 5.0 - 8.0   Glucose, UA NEGATIVE NEGATIVE mg/dL   Hgb urine  dipstick MODERATE (A) NEGATIVE   Bilirubin Urine NEGATIVE NEGATIVE   Ketones, ur NEGATIVE NEGATIVE mg/dL   Protein, ur NEGATIVE NEGATIVE mg/dL   Nitrite NEGATIVE NEGATIVE   Leukocytes, UA NEGATIVE NEGATIVE   RBC / HPF 6-30 0 - 5 RBC/hpf   WBC, UA 0-5 0 - 5 WBC/hpf   Bacteria, UA RARE (A) NONE SEEN   Squamous Epithelial / LPF 0-5 (A) NONE SEEN   Mucus PRESENT    US Ob Transvaginal  Result Date: 01/16/2017 CLINICAL DATA:  Vaginal bleeding in first trimester of pregnancy, abdominal cramping for 1 day, vaginal spotting pink in color this morning EXAM: TRANSVAGINAL OB ULTRASOUND TECHNIQUE: Transvaginal ultrasound was performed for complete evaluation of the gestation as well as the maternal uterus, adnexal regions, and pelvic cul-de-sac. COMPARISON:  01/13/2017 FINDINGS: Intrauterine gestational sac: Present Yolk sac:  Present Embryo:  Present Cardiac  Activity: Present Heart Rate: 120 bpm CRL:   7.9  mm   6 w 4 d                  [place] EDC: 09/07/2017 Subchorionic hemorrhage:  Tiny subchronic hemorrhage Maternal uterus/adnexae: LEFT ovary measures 3.6 x 3.6 x 2.3 cm and contains a small corpus luteal cyst. RIGHT ovary normal size and morphology, 1.2 x 1.4 x 2.2 cm. No free pelvic fluid or adnexal masses. IMPRESSION: Single live intrauterine gestation as above. Tiny subchronic hemorrhage. Electronically Signed   By: Ulyses Southward M.D.   On: 01/16/2017 16:04    Review of Systems  Constitutional: Negative for fever.  Gastrointestinal: Positive for abdominal pain.  Genitourinary: Positive for vaginal bleeding.   Physical Exam   Blood pressure 116/88, pulse 74, temperature 97.6 F (36.4 C), temperature source Oral, resp. rate 18, height 5\' 4"  (1.626 m), weight 202 lb (91.6 kg), last menstrual period 11/13/2016.  Physical Exam  Constitutional: She is oriented to person, place, and time. She appears well-developed and well-nourished. No distress.  HENT:  Head: Normocephalic.  Eyes: Pupils are equal, round, and reactive to light.  GI: Soft. She exhibits no distension. There is no tenderness. There is no rebound and no guarding.  Genitourinary:  Genitourinary Comments: Wet prep and GC collected without speculum. Brown discharge noted on swabs.   Musculoskeletal: Normal range of motion.  Neurological: She is alert and oriented to person, place, and time.  Skin: Skin is warm. She is not diaphoretic.  Psychiatric: Her behavior is normal.    MAU Course  Procedures  None  MDM  Wet prep and GC [place]   Assessment and Plan   A:  1. Subchorionic hematoma in first trimester, single or unspecified fetus   2. Vaginal bleeding in pregnancy, first trimester     P:  Discharge home in stable condition Bleeding precautions Return to MAU if symptoms worsen Pelvic rest   Rosary Filosa, Harolyn Rutherford, NP 01/16/2017 7:40 PM

## 2017-01-16 NOTE — MAU Note (Signed)
Pt states she has bleeding every time she wipes, started this morning, was bright red, is now pink.  Started cramping yesterday, is worse today.  Is not wearing a pad.  Hx of miscarriages.

## 2017-01-17 LAB — GC/CHLAMYDIA PROBE AMP (~~LOC~~) NOT AT ARMC
Chlamydia: NEGATIVE
Neisseria Gonorrhea: NEGATIVE

## 2017-01-18 ENCOUNTER — Ambulatory Visit (INDEPENDENT_AMBULATORY_CARE_PROVIDER_SITE_OTHER): Payer: 59 | Admitting: Obstetrics

## 2017-01-18 ENCOUNTER — Encounter: Payer: Self-pay | Admitting: Obstetrics

## 2017-01-18 VITALS — BP 131/87 | HR 84 | Wt 201.4 lb

## 2017-01-18 DIAGNOSIS — O418X10 Other specified disorders of amniotic fluid and membranes, first trimester, not applicable or unspecified: Secondary | ICD-10-CM | POA: Diagnosis not present

## 2017-01-18 DIAGNOSIS — O468X1 Other antepartum hemorrhage, first trimester: Secondary | ICD-10-CM | POA: Diagnosis not present

## 2017-01-18 DIAGNOSIS — O0990 Supervision of high risk pregnancy, unspecified, unspecified trimester: Secondary | ICD-10-CM

## 2017-01-18 DIAGNOSIS — O219 Vomiting of pregnancy, unspecified: Secondary | ICD-10-CM

## 2017-01-18 DIAGNOSIS — O210 Mild hyperemesis gravidarum: Secondary | ICD-10-CM | POA: Diagnosis not present

## 2017-01-18 DIAGNOSIS — Z113 Encounter for screening for infections with a predominantly sexual mode of transmission: Secondary | ICD-10-CM | POA: Diagnosis not present

## 2017-01-18 DIAGNOSIS — O0991 Supervision of high risk pregnancy, unspecified, first trimester: Secondary | ICD-10-CM

## 2017-01-18 MED ORDER — FOLIC ACID 1 MG PO TABS: 2.0000 mg | ORAL_TABLET | Freq: Every day | ORAL | 11 refills | Status: DC

## 2017-01-18 MED ORDER — DOXYLAMINE SUCCINATE (SLEEP) 25 MG PO TABS: 25.0000 mg | ORAL_TABLET | Freq: Every evening | ORAL | 5 refills | Status: DC | PRN

## 2017-01-18 MED ORDER — VITAMIN B-6 50 MG PO TABS: 50.0000 mg | ORAL_TABLET | Freq: Two times a day (BID) | ORAL | 5 refills | Status: DC

## 2017-01-18 NOTE — Patient Instructions (Addendum)
Folic Acid and Pregnancy What is folic acid? Folic acid is a B vitamin. Your body needs it to make new cells. Folic acid is also called folate. Folate is the form of the B vitamin that is found naturally in food. Folic acid is the artificial (synthetic) form of the B vitamin. Folic acid is added to certain foods (fortified foods) and is also available in dietary supplements such as prenatal vitamins. All women who may become pregnant or are planning to become pregnant need at least [phone] mcg of folic acid daily. Most pregnant women need 914-782 mcg of folic acid per day, but some women need more. What are the benefits of taking folic acid during pregnancy? Taking folic acid during pregnancy helps to prevent abnormalities that can develop in an unborn baby's brain, spine, or spinal column (neural tube defects). These defects include:  Spina bifida. This is when the spinal column does not close completely during development, leaving the spinal cord exposed. This means the nerves that control leg movements and other bodily functions do not work. Spina bifida causes lifelong disabilities.  Anencephaly. Babies born with anencephaly have an underdeveloped brain. They may have little or no brain matter, and they could also be missing parts of the skull.  Neural tube defects occur in the first few months (first trimester) of pregnancy. If you are trying to get pregnant, make sure you get enough folic acid for at least one month before you start trying. It is important to get enough folic acid even if you are not trying to get pregnant, because some pregnancies are unplanned.If your pregnancy is unplanned, start taking folic acid as soon as you find out that you are pregnant. Folic acid can also help to prevent a drop in red blood cells that carry oxygen throughout the body (anemia). Anemia during pregnancy is associated with complications such as low birth weight and premature birth. What are the side  effects of taking folic acid? Folic acid supplements may cause side effects, such as:  Diarrhea.  Abdominal cramping.  Folic acid can also interact with certain medicines that are used to treat other conditions. These medicines include:  Methotrexate. This is an anticancer drug that is also used to treat some autoimmune diseases.  Antiepileptic medicine, such as phenytoin, carbamazepine, and valproate.  Sulfasalazine. This medicine is used to treat ulcerative colitis.  How should I take folic acid during pregnancy? Even women who have a healthy, well-balanced diet may not get enough folate from food. Synthetic folic acid is easier for your body to use than the folate that is found naturally in certain foods. In addition to eating folate-rich foods, you can ensure that you get enough folic acid by taking prenatal vitamins and eating foods that are fortified with folic acid. Make sure your prenatal vitamin or B vitamin supplement contains 400-800 micrograms of folic acid. What foods should I eat? Folate is found naturally in:  Dark green leafy vegetables, such as spinach.  Asparagus.  Brussels sprouts.  Citrus fruits and juices.  Nuts.  Beans.  Peas.  Eggs.  Meat, poultry, and seafood.  Soy products.  Whole grains.  Folic acid is often added to certain foods, including:  Bread.  Pasta.  White rice.  Breakfast cereal.  Flour.  Cornmeal.  When should I seek medical care? Some women may need to take more than the recommended amount of folic acid. Talk with your health care provider about your folic acid needs if:  You had a  baby with a neural tube defect and you want to get pregnant again.  You have a family history of spina bifida.  You have spina bifida and you want to get pregnant.  Talk with your doctor about folic acid supplements if you are taking medicine for any of the following conditions:  Epilepsy.  Type 2 diabetes.  Autoimmune diseases,  including rheumatoid arthritis, lupus, psoriasis, celiac disease, and inflammatory bowel disease.  Asthma.  Kidney disease.  Liver disease.  Sickle cell disease.  This information is not intended to replace advice given to you by your health care provider. Make sure you discuss any questions you have with your health care provider. Document Released: 05/12/2003 Document Revised: 04/04/2016 Document Reviewed: 01/21/2015 Elsevier Interactive Patient Education  2017 [employer] Inc[employer]  Eating Plan for Hyperemesis Gravidarum Hyperemesis gravidarum is a severe form of morning sickness. Because this condition causes severe nausea and vomiting, it can lead to dehydration, malnutrition, and weight loss. One way to lessen the symptoms of nausea and vomiting is to follow the eating plan for hyperemesis gravidarum. It is often used along with prescribed medicines to control your symptoms. What can I do to relieve my symptoms? Listen to your body. Everyone is different and has different preferences. Find what works best for you. Take any of the following actions that are helpful to you:  Eat and drink slowly.  Eat 5-6 small meals daily instead of 3 large meals.  Eat crackers before you get out of bed in the morning.  Try having a snack in the middle of the night.  Starchy foods are usually tolerated well. Examples include cereal, toast, bread, potatoes, pasta, rice, and pretzels.  Ginger may help with nausea. Add  tsp ground ginger to hot tea or choose ginger tea.  Try drinking 100% fruit juice or an electrolyte drink. An electrolyte drink contains sodium, potassium, and chloride.  Continue to take your prenatal vitamins as told by your health care provider. If you are having trouble taking your prenatal vitamins, talk with your health care provider about different options.  Include at least 1 serving of protein with your meals and snacks. Protein options include meats or poultry, beans, nuts,  eggs, and yogurt. Try eating a protein-rich snack before bed. Examples of these snacks include cheese and crackers or half of a peanut butter or [place] sandwich.  Consider eliminating foods that trigger your symptoms. These may include spicy foods, coffee, high-fat foods, very sweet foods, and acidic foods.  Try meals that have more protein combined with bland, salty, lower-fat, and dry foods, such as nuts, seeds, pretzels, crackers, and cereal.  Talk with your healthcare provider about starting a supplement of vitamin B6.  Have fluids that are cold, clear, and carbonated or sour. Examples include lemonade, ginger ale, lemon-lime soda, ice water, and sparkling water.  Try lemon or mint tea.  Try brushing your teeth or using a mouth rinse after meals.  What should I avoid to reduce my symptoms? Avoiding some of the following things may help reduce your symptoms.  Foods with strong smells. Try eating meals in well-ventilated areas that are free of odors.  Drinking water or other beverages with meals. Try not to drink anything during the 30 minutes before and after your meals.  Drinking more than 1 cup of fluid at a time. Sometimes using a straw helps.  Fried or high-fat foods, such as butter and cream sauces.  Spicy foods.  Skipping meals as best as you can.  Nausea can be more intense on an empty stomach. If you cannot tolerate food at that time, do not force it. Try sucking on ice chips or other frozen items, and make up for missed calories later.  Lying down within 2 hours after eating.  Environmental triggers. These may include smoky rooms, closed spaces, rooms with strong smells, warm or humid places, overly loud and noisy rooms, and rooms with motion or flickering lights.  Quick and sudden changes in your movement.  This information is not intended to replace advice given to you by your health care provider. Make sure you discuss any questions you have with your health care  provider. Document Released: 03/06/2007 Document Revised: 01/06/2016 Document Reviewed: 12/08/2015 Elsevier Interactive Patient Education  2018 [employer]  Hyperemesis Gravidarum Hyperemesis gravidarum is a severe form of nausea and vomiting that happens during pregnancy. Hyperemesis is worse than morning sickness. It may cause you to have nausea or vomiting all day for many days. It may keep you from eating and drinking enough food and liquids. Hyperemesis usually occurs during the first half (the first 20 weeks) of pregnancy. It often goes away once a woman is in her second half of pregnancy. However, sometimes hyperemesis continues through an entire pregnancy. What are the causes? The cause of this condition is not known. It may be related to changes in chemicals (hormones) in the body during pregnancy, such as the high level of pregnancy hormone (human chorionic gonadotropin) or the increase in the female sex hormone (estrogen). What are the signs or symptoms? Symptoms of this condition include:  Severe nausea and vomiting.  Nausea that does not go away.  Vomiting that does not allow you to keep any food down.  Weight loss.  Body fluid loss (dehydration).  Having no desire to eat, or not liking food that you have previously enjoyed.  How is this diagnosed? This condition may be diagnosed based on:  A physical exam.  Your medical history.  Your symptoms.  Blood tests.  Urine tests.  How is this treated? This condition may be managed with medicine. If medicines to do not help relieve nausea and vomiting, you may need to receive fluids through an IV tube at the hospital. Follow these instructions at home:  Take over-the-counter and prescription medicines only as told by your health care provider.  Avoid iron pills and multivitamins that contain iron for the first 3-4 months of pregnancy. If you take prescription iron pills, do not stop taking them unless your health  care provider approves.  Take the following actions to help prevent nausea and vomiting: ? In the morning, before getting out of bed, try eating a couple of dry crackers or a piece of toast. ? Avoid foods and smells that upset your stomach. Fatty and spicy foods may make nausea worse. ? Eat 5-6 small meals a day. ? Do not drink fluids while eating meals. Drink between meals. ? Eat or suck on things that have ginger in them. Ginger can help relieve nausea. ? Avoid food preparation. The smell of food can spoil your appetite or trigger nausea.  Follow instructions from your health care provider about eating or drinking restrictions.  For snacks, eat high-protein foods, such as cheese.  Keep all follow-up and pre-birth (prenatal) visits as told by your health care provider. This is important. Contact a health care provider if:  You have pain in your abdomen.  You have a severe headache.  You have vision problems.  You  are losing weight. Get help right away if:  You cannot drink fluids without vomiting.  You vomit blood.  You have constant nausea and vomiting.  You are very weak.  You are very thirsty.  You feel dizzy.  You faint.  You have a fever or other symptoms that last for more than 2-3 days.  You have a fever and your symptoms suddenly get worse. Summary  Hyperemesis gravidarum is a severe form of nausea and vomiting that happens during pregnancy.  Making some changes to your eating habits may help relieve nausea and vomiting.  This condition may be managed with medicine.  If medicines to do not help relieve nausea and vomiting, you may need to receive fluids through an IV tube at the hospital. This information is not intended to replace advice given to you by your health care provider. Make sure you discuss any questions you have with your health care provider. Document Released: 05/09/2005 Document Revised: 01/06/2016 Document Reviewed: 01/06/2016 Elsevier  Interactive Patient Education  2017 Elsevier Inc.  Morning Sickness Morning sickness is when you feel sick to your stomach (nauseous) during pregnancy. This nauseous feeling may or may not come with vomiting. It often occurs in the morning but can be a problem any time of day. Morning sickness is most common during the first trimester, but it may continue throughout pregnancy. While morning sickness is unpleasant, it is usually harmless unless you develop severe and continual vomiting (hyperemesis gravidarum). This condition requires more intense treatment. What are the causes? The cause of morning sickness is not completely known but seems to be related to normal hormonal changes that occur in pregnancy. What increases the risk? You are at greater risk if you:  Experienced nausea or vomiting before your pregnancy.  Had morning sickness during a previous pregnancy.  Are pregnant with more than one baby, such as twins.  How is this treated? Do not use any medicines (prescription, over-the-counter, or herbal) for morning sickness without first talking to your health care provider. Your health care provider may prescribe or recommend:  Vitamin B6 supplements.  Anti-nausea medicines.  The herbal medicine ginger.  Follow these instructions at home:  Only take over-the-counter or prescription medicines as directed by your health care provider.  Taking multivitamins before getting pregnant can prevent or decrease the severity of morning sickness in most women.  Eat a piece of dry toast or unsalted crackers before getting out of bed in the morning.  Eat five or six small meals a day.  Eat dry and bland foods (rice, baked potato). Foods high in carbohydrates are often helpful.  Do not drink liquids with your meals. Drink liquids between meals.  Avoid greasy, fatty, and spicy foods.  Get someone to cook for you if the smell of any food causes nausea and vomiting.  If you feel  nauseous after taking prenatal vitamins, take the vitamins at night or with a snack.  Snack on protein foods (nuts, yogurt, cheese) between meals if you are hungry.  Eat unsweetened gelatins for desserts.  Wearing an acupressure wristband (worn for sea sickness) may be helpful.  Acupuncture may be helpful.  Do not smoke.  Get a humidifier to keep the air in your house free of odors.  Get plenty of fresh air. Contact a health care provider if:  Your home remedies are not working, and you need medicine.  You feel dizzy or lightheaded.  You are losing weight. Get help right away if:  You have  persistent and uncontrolled nausea and vomiting.  You pass out (faint). This information is not intended to replace advice given to you by your health care provider. Make sure you discuss any questions you have with your health care provider. Document Released: 06/30/2006 Document Revised: 10/15/2015 Document Reviewed: 10/24/2012 Elsevier Interactive Patient Education  2017 [employer].

## 2017-01-18 NOTE — Progress Notes (Signed)
Patient ID: Carla [name], female   DOB: 1990/10/10, 26 y.o.   MRN: [id number]  Chief Complaint  Patient presents with  . Initial Prenatal Visit    HPI Carla [name] is a 26 y.o. female.  Early pregnancy with nausea and vomiting.  Had hyperemesis with last pregnancy 3 years ago. HPI  Past Medical History:  Diagnosis Date  . Allergy   . Anemia   . Depression   . Headache(784.0)   . PONV (postoperative nausea and vomiting)   . Spontaneous abortion 12/03/2010    Past Surgical History:  Procedure Laterality Date  . CHOLECYSTECTOMY N/A 05/13/2015   Procedure: LAPAROSCOPIC CHOLECYSTECTOMY ;  Surgeon: Manus Rudd, MD;  Location: [facility] OR;  Service: General;  Laterality: N/A;  . DILATION AND EVACUATION  12/03/2010   Procedure: DILATATION AND EVACUATION (D&E);  Surgeon: Roseanna Rainbow, MD;  Location: [facility];  Service: Gynecology;  Laterality: N/A;  . OVARIAN CYST REMOVAL    . TONSILLECTOMY      Family History  Problem Relation Age of Onset  . Hypertension Other   . Diabetes Other   . Stroke Other   . Cancer Other   . Heart failure Other   . Seizures Other     Social History Social History  Substance Use Topics  . Smoking status: Never Smoker  . Smokeless tobacco: Never Used  . Alcohol use No     Comment: Ocassionally    Allergies  Allergen Reactions  . Shellfish Allergy Anaphylaxis  . Latex Itching    Current Outpatient Prescriptions  Medication Sig Dispense Refill  . acetaminophen (TYLENOL) 500 MG tablet Take 1,000 mg by mouth every 6 (six) hours as needed for moderate pain or fever. Reported on 11/26/2015    . Prenatal Vit-Fe Fumarate-FA (PRENATAL MULTIVITAMIN) TABS tablet Take 1 tablet by mouth daily at 12 noon.    . promethazine (PHENERGAN) 25 MG tablet Take 1 tablet (25 mg total) by mouth every 6 (six) hours as needed for nausea or vomiting. 30 tablet 1  . doxylamine, Sleep, (UNISOM) 25 MG tablet Take 1 tablet (25 mg total) by mouth at bedtime as needed. 30  tablet 5  . folic acid (FOLVITE) 1 MG tablet Take 2 tablets (2 mg total) by mouth daily. 60 tablet 11  . pyridOXINE (VITAMIN B-6) 50 MG tablet Take 1 tablet (50 mg total) by mouth 2 (two) times daily at 8 am and 10 pm. 30 tablet 5   No current facility-administered medications for this visit.     Review of Systems Review of Systems Constitutional: negative for fatigue and weight loss Respiratory: negative for cough and wheezing Cardiovascular: negative for chest pain, fatigue and palpitations Gastrointestinal: positive for nausea and vomiting Genitourinary:positive for vaginal bleeding Integument/breast: negative for nipple discharge Musculoskeletal:negative for myalgias Neurological: negative for gait problems and tremors Behavioral/Psych: negative for abusive relationship, depression Endocrine: negative for temperature intolerance      Blood pressure 131/87, pulse 84, weight 201 lb 6.4 oz (91.4 kg), last menstrual period 11/13/2016.  Physical Exam Physical Exam:  Deferred 50% of 15 min visit spent on counseling and coordination of care.    Data Reviewed Labs  Assessment     1. Supervision of high risk pregnancy, antepartum Rx: - doxylamine, Sleep, (UNISOM) 25 MG tablet; Take 1 tablet (25 mg total) by mouth at bedtime as needed.  Dispense: 30 tablet; Refill: 5 - folic acid (FOLVITE) 1 MG tablet; Take 2 tablets (2 mg total) by mouth daily.  Dispense: 60 tablet; Refill: 11 - pyridOXINE (VITAMIN B-6) 50 MG tablet; Take 1 tablet (50 mg total) by mouth 2 (two) times daily at 8 am and 10 pm.  Dispense: 30 tablet; Refill: 5  2. Nausea and vomiting during pregnancy prior to [redacted] weeks gestation Rx: - doxylamine, Sleep, (UNISOM) 25 MG tablet; Take 1 tablet (25 mg total) by mouth at bedtime as needed.  Dispense: 30 tablet; Refill: 5 - pyridOXINE (VITAMIN B-6) 50 MG tablet; Take 1 tablet (50 mg total) by mouth 2 (two) times daily at 8 am and 10 pm.  Dispense: 30 tablet; Refill: 5  3.  Hyperemesis gravidarum, antepartum Rx: - doxylamine, Sleep, (UNISOM) 25 MG tablet; Take 1 tablet (25 mg total) by mouth at bedtime as needed.  Dispense: 30 tablet; Refill: 5 - pyridOXINE (VITAMIN B-6) 50 MG tablet; Take 1 tablet (50 mg total) by mouth 2 (two) times daily at 8 am and 10 pm.  Dispense: 30 tablet; Refill: 5  4. Subchorionic hemorrhage of placenta in first trimester, single or unspecified fetus Rx: - pelvic rest    Plan    Follow up in 4 weeks for NOB visit  No orders of the defined types were placed in this encounter.  Meds ordered this encounter  Medications  . doxylamine, Sleep, (UNISOM) 25 MG tablet    Sig: Take 1 tablet (25 mg total) by mouth at bedtime as needed.    Dispense:  30 tablet    Refill:  5  . folic acid (FOLVITE) 1 MG tablet    Sig: Take 2 tablets (2 mg total) by mouth daily.    Dispense:  60 tablet    Refill:  11  . pyridOXINE (VITAMIN B-6) 50 MG tablet    Sig: Take 1 tablet (50 mg total) by mouth 2 (two) times daily at 8 am and 10 pm.    Dispense:  30 tablet    Refill:  5

## 2017-01-18 NOTE — Progress Notes (Signed)
Patient is still experiencing sharp period type cramping. She is still having a bloody show when she wipes. She did have blood on her underwear this morning. Patient is getting sick everyday- all day. Patient is not able to keep fluids down.

## 2017-01-20 ENCOUNTER — Telehealth: Payer: Self-pay

## 2017-01-20 NOTE — Telephone Encounter (Signed)
Returned call and pt stated that provider did not send med for nausea after appt on Wed, advised would route to provider for reveiw.

## 2017-01-24 ENCOUNTER — Encounter: Payer: Self-pay | Admitting: Family Medicine

## 2017-01-24 DIAGNOSIS — O4690 Antepartum hemorrhage, unspecified, unspecified trimester: Secondary | ICD-10-CM | POA: Insufficient documentation

## 2017-01-24 DIAGNOSIS — O09629 Supervision of young multigravida, unspecified trimester: Secondary | ICD-10-CM | POA: Insufficient documentation

## 2017-01-24 DIAGNOSIS — O99210 Obesity complicating pregnancy, unspecified trimester: Secondary | ICD-10-CM | POA: Insufficient documentation

## 2017-01-30 ENCOUNTER — Encounter (HOSPITAL_COMMUNITY): Payer: Self-pay | Admitting: *Deleted

## 2017-01-30 ENCOUNTER — Inpatient Hospital Stay (HOSPITAL_COMMUNITY)
Admission: AD | Admit: 2017-01-30 | Discharge: 2017-01-30 | Disposition: A | Discharge status: Home / Self Care | Payer: 59 | Source: Ambulatory Visit | Attending: Obstetrics & Gynecology | Admitting: Obstetrics & Gynecology

## 2017-01-30 DIAGNOSIS — Z3A01 Less than 8 weeks gestation of pregnancy: Secondary | ICD-10-CM | POA: Insufficient documentation

## 2017-01-30 DIAGNOSIS — O219 Vomiting of pregnancy, unspecified: Secondary | ICD-10-CM | POA: Diagnosis not present

## 2017-01-30 DIAGNOSIS — O210 Mild hyperemesis gravidarum: Secondary | ICD-10-CM | POA: Insufficient documentation

## 2017-01-30 LAB — URINALYSIS, ROUTINE W REFLEX MICROSCOPIC
BILIRUBIN URINE: NEGATIVE
GLUCOSE, UA: NEGATIVE mg/dL
KETONES UR: NEGATIVE mg/dL
Nitrite: NEGATIVE
PH: 6 (ref 5.0–8.0)
Protein, ur: 30 mg/dL — AB
Specific Gravity, Urine: 1.027 (ref 1.005–1.030)

## 2017-01-30 LAB — BASIC METABOLIC PANEL
ANION GAP: 9 (ref 5–15)
BUN: 6 mg/dL (ref 6–20)
CO2: 23 mmol/L (ref 22–32)
Calcium: 9.1 mg/dL (ref 8.9–10.3)
Chloride: 102 mmol/L (ref 101–111)
Creatinine, Ser: 0.52 mg/dL (ref 0.44–1.00)
GFR calc Af Amer: >60 mL/min (ref >60)
GFR calc non Af Amer: >60 mL/min (ref >60)
GLUCOSE: 84 mg/dL (ref 65–99)
Potassium: 3.9 mmol/L (ref 3.5–5.1)
Sodium: 134 mmol/L — ABNORMAL LOW (ref 135–145)

## 2017-01-30 MED ORDER — METOCLOPRAMIDE HCL 10 MG PO TABS
10.0000 mg | ORAL_TABLET | Freq: Once | ORAL | Status: AC
  Administered 2017-01-30: 10 mg via ORAL
  Filled 2017-01-30: qty 1

## 2017-01-30 MED ORDER — METOCLOPRAMIDE HCL 10 MG PO TABS: 10.0000 mg | ORAL_TABLET | Freq: Three times a day (TID) | ORAL | 0 refills | Status: DC | PRN

## 2017-01-30 MED ORDER — RANITIDINE HCL 150 MG PO TABS: 150.0000 mg | ORAL_TABLET | Freq: Two times a day (BID) | ORAL | 0 refills | Status: DC

## 2017-01-30 NOTE — [facility] Note (Signed)
Pt presents to [facility] with complaints of nausea and vomiting with lower abdominal cramping for a couple of weeks.

## 2017-01-30 NOTE — MAU Provider Note (Signed)
History     CSN: [id number]  Arrival date and time: 01/30/17 1416  First Provider Initiated Contact with Patient 01/30/17 1516      Chief Complaint  Patient presents with  . Morning Sickness  . Abdominal Pain   HPI TANNIS BURSTEIN is a 26 y.o. G5P1031 at 33w2dwho presents with nausea & vomiting. Symptoms have continued for the last 3 weeks. Hx of HEG with previous pregnancy. Has been taking phenergan without relief; ran out of rx this morning. Was rx diclegis by Dr. HJodi Mourningbut states the pharmacy hasn't filled it yet. Reports vomiting 5 times today & being unable to keep down applesauce. Endorses indigestion that she has been taking tums for. Denies abdominal pain, vaginal bleeding, fever/chills, diarrhea, or constipation.  OB History    Gravida Para Term Preterm AB Living   '5 1 1   3 1   '$ SAB TAB Ectopic Multiple Live Births   3       1      Obstetric Comments   Patient has had multiple miscarriages- she has had 2 recent SAB. The most recent is reported 1 1/2 months ago.      Past Medical History:  Diagnosis Date  . Allergy   . Anemia   . Depression   . Headache(784.0)   . PONV (postoperative nausea and vomiting)   . Spontaneous abortion 12/03/2010    Past Surgical History:  Procedure Laterality Date  . CHOLECYSTECTOMY N/A 05/13/2015   Procedure: LAPAROSCOPIC CHOLECYSTECTOMY ;  Surgeon: MDonnie Mesa MD;  [facility]  Service: General;  Laterality: N/A;  . DILATION AND EVACUATION  12/03/2010   Procedure: DILATATION AND EVACUATION (D&E);  Surgeon: LAgnes  MD;  [facility];  Service: Gynecology;  Laterality: N/A;  . OVARIAN CYST REMOVAL    . TONSILLECTOMY      Family History  Problem Relation Age of Onset  . Hypertension Other   . Diabetes Other   . Stroke Other   . Cancer Other   . Heart failure Other   . Seizures Other     Social History  Substance Use Topics  . Smoking status: Never Smoker  . Smokeless tobacco: Never Used  . Alcohol  use No     Comment: Ocassionally    Allergies:  Allergies  Allergen Reactions  . Shellfish Allergy Anaphylaxis  . Latex Itching    Prescriptions Prior to Admission  Medication Sig Dispense Refill Last Dose  . acetaminophen (TYLENOL) 500 MG tablet Take 1,000 mg by mouth every 6 (six) hours as needed for moderate pain or fever. Reported on 11/26/2015   01/29/2017 at Unknown time  . folic acid (FOLVITE) 1 MG tablet Take 2 tablets (2 mg total) by mouth daily. 60 tablet 11 01/30/2017 at Unknown time  . promethazine (PHENERGAN) 25 MG tablet Take 1 tablet (25 mg total) by mouth every 6 (six) hours as needed for nausea or vomiting. 30 tablet 1 01/29/2017 at Unknown time  . pyridOXINE (VITAMIN B-6) 50 MG tablet Take 1 tablet (50 mg total) by mouth 2 (two) times daily at 8 am and 10 pm. 30 tablet 5 01/30/2017 at Unknown time  . doxylamine, Sleep, (UNISOM) 25 MG tablet Take 1 tablet (25 mg total) by mouth at bedtime as needed. (Patient not taking: Reported on 01/30/2017) 30 tablet 5 Not Taking at Unknown time    Review of Systems  Constitutional: Negative.   Gastrointestinal: Positive for nausea and vomiting. Negative for abdominal pain,  constipation and diarrhea.  Genitourinary: Negative.    Physical Exam   Blood pressure 125/84, pulse 83, temperature 97.8 F (36.6 C), resp. rate 18, weight 199 lb (90.3 kg), last menstrual period 11/13/2016.  Physical Exam  Nursing note and vitals reviewed. Constitutional: She is oriented to person, place, and time. She appears well-developed and well-nourished. No distress.  HENT:  Head: Normocephalic and atraumatic.  Eyes: Conjunctivae are normal. Right eye exhibits no discharge. Left eye exhibits no discharge. No scleral icterus.  Neck: Normal range of motion.  Cardiovascular: Normal rate, regular rhythm and normal heart sounds.   No murmur heard. Respiratory: Effort normal and breath sounds normal. No respiratory distress. She has no wheezes.  GI: Soft.  Bowel sounds are normal. There is no tenderness.  Neurological: She is alert and oriented to person, place, and time.  Skin: Skin is warm and dry. She is not diaphoretic.  Psychiatric: She has a normal mood and affect. Her behavior is normal. Judgment and thought content normal.    MAU Course  Procedures Results for orders placed or performed during the hospital encounter of 01/30/17 (from the past 48 hour(s))  Urinalysis, Routine w reflex microscopic     Status: Abnormal   Collection Time: 01/30/17  2:30 PM  Result Value Ref Range   Color, Urine YELLOW YELLOW   APPearance HAZY (A) CLEAR   Specific Gravity, Urine 1.027 1.005 - 1.030   pH 6.0 5.0 - 8.0   Glucose, UA NEGATIVE NEGATIVE mg/dL   Hgb urine dipstick SMALL (A) NEGATIVE   Bilirubin Urine NEGATIVE NEGATIVE   Ketones, ur NEGATIVE NEGATIVE mg/dL   Protein, ur 30 (A) NEGATIVE mg/dL   Nitrite NEGATIVE NEGATIVE   Leukocytes, UA TRACE (A) NEGATIVE   RBC / HPF TOO NUMEROUS TO COUNT 0 - 5 RBC/hpf   WBC, UA 6-30 0 - 5 WBC/hpf   Bacteria, UA FEW (A) NONE SEEN   Squamous Epithelial / LPF 6-30 (A) NONE SEEN   Mucus PRESENT   Basic metabolic panel     Status: Abnormal   Collection Time: 01/30/17  4:31 PM  Result Value Ref Range   Sodium 134 (L) 135 - 145 mmol/L   Potassium 3.9 3.5 - 5.1 mmol/L   Chloride 102 101 - 111 mmol/L   CO2 23 22 - 32 mmol/L   Glucose, Bld 84 65 - 99 mg/dL   BUN 6 6 - 20 mg/dL   Creatinine, Ser 0.52 0.44 - 1.00 mg/dL   Calcium 9.1 8.9 - 10.3 mg/dL   GFR calc non Af Amer >60 >60 mL/min   GFR calc Af Amer >60 >60 mL/min    Comment: (NOTE) The eGFR has been calculated using the CKD EPI equation. This calculation has not been validated in all clinical situations. eGFR's persistently <60 mL/min signify possible Chronic Kidney Disease.    Anion gap 9 5 - 15    MDM VSS, NAD. U/a not impressive for dehydration. Patient not observed vomiting in MAU. Reglan 10 mg PO given, pt tolerate well & reports  improvement in symptoms.  Weight stable; 4# weight loss in the last month.  Assessment and Plan  A: 1. Nausea and vomiting during pregnancy prior to [redacted] weeks gestation    P: Discharge home Rx reglan & zantac D/c phenergan Discussed reasons to return to MAU Keep f/u with OB  Jorje Guild 01/30/2017, 3:15 PM

## 2017-01-30 NOTE — Discharge Instructions (Signed)
Eating Plan for Hyperemesis Gravidarum °Hyperemesis gravidarum is a severe form of morning sickness. Because this condition causes severe nausea and vomiting, it can lead to dehydration, malnutrition, and weight loss. One way to lessen the symptoms of nausea and vomiting is to follow the eating plan for hyperemesis gravidarum. It is often used along with prescribed medicines to control your symptoms. °What can I do to relieve my symptoms? °Listen to your body. Everyone is different and has different preferences. Find what works best for you. Take any of the following actions that are helpful to you: °· Eat and drink slowly. °· Eat 5-6 small meals daily instead of 3 large meals. °· Eat crackers before you get out of bed in the morning. °· Try having a snack in the middle of the night. °· Starchy foods are usually tolerated well. Examples include cereal, toast, bread, potatoes, pasta, rice, and pretzels. °· Ginger may help with nausea. Add ¼ tsp ground ginger to hot tea or choose ginger tea. °· Try drinking 100% fruit juice or an electrolyte drink. An electrolyte drink contains sodium, potassium, and chloride. °· Continue to take your prenatal vitamins as told by your health care provider. If you are having trouble taking your prenatal vitamins, talk with your health care provider about different options. °· Include at least 1 serving of protein with your meals and snacks. Protein options include meats or poultry, beans, nuts, eggs, and yogurt. Try eating a protein-rich snack before bed. Examples of these snacks include cheese and crackers or half of a peanut butter or turkey sandwich. °· Consider eliminating foods that trigger your symptoms. These may include spicy foods, coffee, high-fat foods, very sweet foods, and acidic foods. °· Try meals that have more protein combined with bland, salty, lower-fat, and dry foods, such as nuts, seeds, pretzels, crackers, and cereal. °· Talk with your healthcare provider about  starting a supplement of vitamin B6. °· Have fluids that are cold, clear, and carbonated or sour. Examples include lemonade, ginger ale, lemon-lime soda, ice water, and sparkling water. °· Try lemon or mint tea. °· Try brushing your teeth or using a mouth rinse after meals. ° °What should I avoid to reduce my symptoms? °Avoiding some of the following things may help reduce your symptoms. °· Foods with strong smells. Try eating meals in well-ventilated areas that are free of odors. °· Drinking water or other beverages with meals. Try not to drink anything during the 30 minutes before and after your meals. °· Drinking more than 1 cup of fluid at a time. Sometimes using a straw helps. °· Fried or high-fat foods, such as butter and cream sauces. °· Spicy foods. °· Skipping meals as best as you can. Nausea can be more intense on an empty stomach. If you cannot tolerate food at that time, do not force it. Try sucking on ice chips or other frozen items, and make up for missed calories later. °· Lying down within 2 hours after eating. °· Environmental triggers. These may include smoky rooms, closed spaces, rooms with strong smells, warm or humid places, overly loud and noisy rooms, and rooms with motion or flickering lights. °· Quick and sudden changes in your movement. ° °This information is not intended to replace advice given to you by your health care provider. Make sure you discuss any questions you have with your health care provider. °Document Released: 03/06/2007 Document Revised: 01/06/2016 Document Reviewed: 12/08/2015 °Elsevier Interactive Patient Education © 2018 [employer] ° °

## 2017-02-06 ENCOUNTER — Inpatient Hospital Stay (HOSPITAL_COMMUNITY)
Admission: AD | Admit: 2017-02-06 | Discharge: 2017-02-06 | Disposition: A | Discharge status: Home / Self Care | Payer: 59 | Source: Ambulatory Visit | Attending: Family Medicine | Admitting: Family Medicine

## 2017-02-06 ENCOUNTER — Encounter (HOSPITAL_COMMUNITY): Payer: Self-pay

## 2017-02-06 DIAGNOSIS — O219 Vomiting of pregnancy, unspecified: Secondary | ICD-10-CM

## 2017-02-06 DIAGNOSIS — O99342 Other mental disorders complicating pregnancy, second trimester: Secondary | ICD-10-CM | POA: Insufficient documentation

## 2017-02-06 DIAGNOSIS — Z9049 Acquired absence of other specified parts of digestive tract: Secondary | ICD-10-CM | POA: Diagnosis not present

## 2017-02-06 DIAGNOSIS — O99282 Endocrine, nutritional and metabolic diseases complicating pregnancy, second trimester: Secondary | ICD-10-CM | POA: Diagnosis not present

## 2017-02-06 DIAGNOSIS — O99212 Obesity complicating pregnancy, second trimester: Secondary | ICD-10-CM | POA: Insufficient documentation

## 2017-02-06 DIAGNOSIS — O99210 Obesity complicating pregnancy, unspecified trimester: Secondary | ICD-10-CM

## 2017-02-06 DIAGNOSIS — O4690 Antepartum hemorrhage, unspecified, unspecified trimester: Secondary | ICD-10-CM

## 2017-02-06 DIAGNOSIS — O09629 Supervision of young multigravida, unspecified trimester: Secondary | ICD-10-CM

## 2017-02-06 DIAGNOSIS — O4692 Antepartum hemorrhage, unspecified, second trimester: Secondary | ICD-10-CM | POA: Insufficient documentation

## 2017-02-06 DIAGNOSIS — Z3A09 9 weeks gestation of pregnancy: Secondary | ICD-10-CM | POA: Insufficient documentation

## 2017-02-06 DIAGNOSIS — O26892 Other specified pregnancy related conditions, second trimester: Secondary | ICD-10-CM | POA: Diagnosis not present

## 2017-02-06 DIAGNOSIS — F329 Major depressive disorder, single episode, unspecified: Secondary | ICD-10-CM | POA: Insufficient documentation

## 2017-02-06 DIAGNOSIS — Z91013 Allergy to seafood: Secondary | ICD-10-CM | POA: Insufficient documentation

## 2017-02-06 DIAGNOSIS — E860 Dehydration: Secondary | ICD-10-CM

## 2017-02-06 DIAGNOSIS — R109 Unspecified abdominal pain: Secondary | ICD-10-CM | POA: Insufficient documentation

## 2017-02-06 DIAGNOSIS — Z79899 Other long term (current) drug therapy: Secondary | ICD-10-CM | POA: Insufficient documentation

## 2017-02-06 LAB — URINALYSIS, ROUTINE W REFLEX MICROSCOPIC
Bilirubin Urine: NEGATIVE
GLUCOSE, UA: NEGATIVE mg/dL
HGB URINE DIPSTICK: NEGATIVE
KETONES UR: 20 mg/dL — AB
NITRITE: NEGATIVE
PROTEIN: 100 mg/dL — AB
Specific Gravity, Urine: 1.025 (ref 1.005–1.030)
pH: 5 (ref 5.0–8.0)

## 2017-02-06 MED ORDER — DOXYLAMINE-PYRIDOXINE 10-10 MG PO TBEC: DELAYED_RELEASE_TABLET | ORAL | 6 refills | Status: DC

## 2017-02-06 MED ORDER — PROMETHAZINE HCL 25 MG RE SUPP: 25.0000 mg | Freq: Four times a day (QID) | RECTAL | 0 refills | Status: DC | PRN

## 2017-02-06 MED ORDER — PROMETHAZINE HCL 25 MG/ML IJ SOLN
25.0000 mg | INTRAVENOUS | Status: DC
  Administered 2017-02-06: 25 mg via INTRAVENOUS
  Filled 2017-02-06: qty 1

## 2017-02-06 NOTE — MAU Provider Note (Signed)
History     CSN: [id number]  Arrival date and time: 02/06/17 1451   First Provider Initiated Contact with Patient 02/06/17 1554      Chief Complaint  Patient presents with  . Emesis  . Abdominal Pain   Carla [name] is a 26 y.o. G5P1031 at [redacted]w[redacted]d with intractable vomiting , not retaining antiemetics. Estimates she vomited 16 times in the last 24 hours and has retained only water sips. Denies orthostatic symptoms.      OB History  Gravida Para Term Preterm AB Living  SAB TAB Ectopic Multiple Live Births  3       1    # Outcome Date GA Lbr Len/2nd Weight Sex Delivery Anes PTL Lv  5 Current           4 Term 05/25/13 [redacted]w[redacted]d 11:01 / 01:14 6 lb 12.8 oz (3.085 kg) F Vag-Spont EPI  LIV  3 SAB           2 SAB           1 SAB             Obstetric Comments  Patient has had multiple miscarriages- she has had 2 recent SAB. The most recent is reported 1 1/2 months ago.     Past Medical History:  Diagnosis Date  . Allergy   . Anemia   . Depression   . Headache(784.0)   . PONV (postoperative nausea and vomiting)   . Spontaneous abortion 12/03/2010    Past Surgical History:  Procedure Laterality Date  . CHOLECYSTECTOMY N/A 05/13/2015   Procedure: LAPAROSCOPIC CHOLECYSTECTOMY ;  Surgeon: Manus Rudd, MD;  [facility];  Service: General;  Laterality: N/A;  . DILATION AND EVACUATION  12/03/2010   Procedure: DILATATION AND EVACUATION (D&E);  Surgeon: Roseanna Rainbow, MD;  Location: [facility];  Service: Gynecology;  Laterality: N/A;  . OVARIAN CYST REMOVAL    . TONSILLECTOMY      Family History  Problem Relation Age of Onset  . Hypertension Other   . Diabetes Other   . Stroke Other   . Cancer Other   . Heart failure Other   . Seizures Other     Social History  Substance Use Topics  . Smoking status: Never Smoker  . Smokeless tobacco: Never Used  . Alcohol use No     Comment: Ocassionally    Allergies:  Allergies  Allergen Reactions  .  Shellfish Allergy Anaphylaxis  . Latex Itching    Prescriptions Prior to Admission  Medication Sig Dispense Refill Last Dose  . acetaminophen (TYLENOL) 500 MG tablet Take 1,000 mg by mouth every 6 (six) hours as needed for moderate pain or fever. Reported on 11/26/2015   01/29/2017 at Unknown time  . doxylamine, Sleep, (UNISOM) 25 MG tablet Take 1 tablet (25 mg total) by mouth at bedtime as needed. (Patient not taking: Reported on 01/30/2017) 30 tablet 5 Not Taking at Unknown time  . folic acid (FOLVITE) 1 MG tablet Take 2 tablets (2 mg total) by mouth daily. 60 tablet 11 01/30/2017 at Unknown time  . metoCLOPramide (REGLAN) 10 MG tablet Take 1 tablet (10 mg total) by mouth every 8 (eight) hours as needed for nausea. 30 tablet 0   . pyridOXINE (VITAMIN B-6) 50 MG tablet Take 1 tablet (50 mg total) by mouth [id number] times daily at 8 am and 10 pm. 30 tablet 5 01/30/2017 at Unknown time  .  ranitidine (ZANTAC) 150 MG tablet Take 1 tablet (150 mg total) by mouth 2 (two) times daily. 60 tablet 0     Review of Systems  Constitutional: Positive for appetite change and fatigue. Negative for fever.  Eyes: Negative for visual disturbance.  Respiratory: Negative for cough.   Gastrointestinal: Positive for nausea and vomiting. Negative for abdominal pain and diarrhea.  Genitourinary: Negative for frequency, urgency, vaginal bleeding and vaginal discharge.   Physical Exam   Blood pressure 123/81, pulse 86, temperature 98.5 F (36.9 C), temperature source Oral, resp. rate 18, height  (1.626 m), weight 196 lb 4 oz (89 kg), last menstrual period 11/13/2016, SpO2 100 %.  Physical Exam  Nursing note and vitals reviewed. Constitutional: She is oriented to person, place, and time. She appears well-developed and well-nourished. No distress.  Appears fatigued  HENT:  Head: Normocephalic.  Eyes: No scleral icterus.  Neck: Neck supple.  Respiratory: Effort normal.  GI: Soft. There is no tenderness.   Musculoskeletal: Normal range of motion.  Neurological: She is alert and oriented to person, place, and time.  Skin: Skin is warm and dry.  Psychiatric: She has a normal mood and affect. Her behavior is normal.    MAU Course  Procedures Results for orders placed or performed during the hospital encounter of 02/06/17 (from the past 24 hour(s))  Urinalysis, Routine w reflex microscopic     Status: Abnormal   Collection Time: 02/06/17  3:15 PM  Result Value Ref Range   Color, Urine YELLOW YELLOW   APPearance HAZY (A) CLEAR   Specific Gravity, Urine 1.025 1.005 - 1.030   pH 5.0 5.0 - 8.0   Glucose, UA NEGATIVE NEGATIVE mg/dL   Hgb urine dipstick NEGATIVE NEGATIVE   Bilirubin Urine NEGATIVE NEGATIVE   Ketones, ur 20 (A) NEGATIVE mg/dL   Protein, ur 098 (A) NEGATIVE mg/dL   Nitrite NEGATIVE NEGATIVE   Leukocytes, UA SMALL (A) NEGATIVE   RBC / HPF 6-30 0 - 5 RBC/hpf   WBC, UA 6-30 0 - 5 WBC/hpf   Bacteria, UA RARE (A) NONE SEEN   Squamous Epithelial / LPF 6-30 (A) NONE SEEN   Mucus PRESENT     IV LR 1000 with Phenergan  given  1815: Dr. Jolayne Panther assumed care Assessment and Plan   1. Nausea and vomiting during pregnancy prior to [redacted] weeks gestation   2. Supervision of high-risk pregnancy of young multigravida   3. Obesity affecting pregnancy, antepartum   4. Vaginal bleeding during pregnancy, antepartum   5. Dehydration, moderate    [name] CNM 02/06/2017, 3:54 PM   Patient able to tolerate ginger ale and crackers without emesis. Patient states she had emesis throughout her last pregnancy Rx phenergan suppository and diclegis provided Patient to follow up as scheduled for prenatal care

## 2017-02-06 NOTE — MAU Note (Signed)
Pt c/o vomiting non-stop for the last couple of weeks. Pt states she can't keep anything down. Pt denies diarrhea. Pt denies bleeding. Pt c/o abdominal cramping and lower back pain since Saturday.

## 2017-02-16 ENCOUNTER — Other Ambulatory Visit: Payer: Self-pay | Admitting: Student

## 2017-02-16 ENCOUNTER — Ambulatory Visit (INDEPENDENT_AMBULATORY_CARE_PROVIDER_SITE_OTHER): Payer: 59 | Admitting: Obstetrics & Gynecology

## 2017-02-16 ENCOUNTER — Encounter: Payer: Self-pay | Admitting: Obstetrics & Gynecology

## 2017-02-16 DIAGNOSIS — O99212 Obesity complicating pregnancy, second trimester: Secondary | ICD-10-CM

## 2017-02-16 DIAGNOSIS — O4692 Antepartum hemorrhage, unspecified, second trimester: Secondary | ICD-10-CM

## 2017-02-16 DIAGNOSIS — O09622 Supervision of young multigravida, second trimester: Secondary | ICD-10-CM

## 2017-02-16 DIAGNOSIS — O99210 Obesity complicating pregnancy, unspecified trimester: Secondary | ICD-10-CM

## 2017-02-16 DIAGNOSIS — O09629 Supervision of young multigravida, unspecified trimester: Secondary | ICD-10-CM

## 2017-02-16 DIAGNOSIS — O4690 Antepartum hemorrhage, unspecified, unspecified trimester: Secondary | ICD-10-CM

## 2017-02-16 DIAGNOSIS — O210 Mild hyperemesis gravidarum: Secondary | ICD-10-CM

## 2017-02-16 MED ORDER — PANTOPRAZOLE SODIUM 40 MG PO TBEC: 40.0000 mg | DELAYED_RELEASE_TABLET | Freq: Every day | ORAL | 3 refills | Status: DC

## 2017-02-16 MED ORDER — METOCLOPRAMIDE HCL 10 MG PO TABS: 10.0000 mg | ORAL_TABLET | Freq: Three times a day (TID) | ORAL | 0 refills | Status: DC | PRN

## 2017-02-16 MED ORDER — ONDANSETRON 4 MG PO TBDP: 4.0000 mg | ORAL_TABLET | Freq: Four times a day (QID) | ORAL | 2 refills | Status: DC | PRN

## 2017-02-16 NOTE — Progress Notes (Signed)
Pt is still having severe N&V.  Pt states that she is not able to keep much anything down. Pt states no relief with current medications.  Pt states that she is having back pain and has pelvic cramping when standing up for prolong periods. Pt has been unable to get Diclegis Rx due to non coverage with insurance. Pt was recently seen at [facility] and was given fluids.

## 2017-02-16 NOTE — Progress Notes (Signed)
Subjective:    Carla [name] is a [id number] [redacted]w[redacted]d being seen today for her first obstetrical visit.  Her obstetrical history is significant for hyperemesis. Patient does intend to breast feed. Pregnancy history fully reviewed.  Patient reports heartburn, nausea and vomiting.  Vitals:   02/16/17 1027  BP: 133/85  Pulse: 96  Weight: 194 lb (88 kg)    HISTORY: OB History  Gravida Para Term Preterm AB Living  SAB TAB Ectopic Multiple Live Births  3       1    # Outcome Date GA Lbr Len/2nd Weight Sex Delivery Anes PTL Lv  5 Current           4 Term 05/25/13 [redacted]w[redacted]d 11:01 / 01:14 6 lb 12.8 oz (3.085 kg) F Vag-Spont EPI  LIV  3 SAB           2 SAB           1 SAB             Obstetric Comments  Patient has had multiple miscarriages- she has had 2 recent SAB. The most recent is reported 1 1/2 months ago.  Pt states she may have had up to 8 pregnancies, has had multiple SAB- lost count.   Past Medical History:  Diagnosis Date  . Allergy   . Anemia   . Depression   . Headache(784.0)   . PONV (postoperative nausea and vomiting)   . Spontaneous abortion 12/03/2010   Past Surgical History:  Procedure Laterality Date  . CHOLECYSTECTOMY N/A 05/13/2015   Procedure: LAPAROSCOPIC CHOLECYSTECTOMY ;  Surgeon: Manus Rudd, MD;  Location: [facility];  Service: General;  Laterality: N/A;  . DILATION AND EVACUATION  12/03/2010   Procedure: DILATATION AND EVACUATION (D&E);  Surgeon: Roseanna Rainbow, MD;  Location: [facility];  Service: Gynecology;  Laterality: N/A;  . OVARIAN CYST REMOVAL    . TONSILLECTOMY     Family History  Problem Relation Age of Onset  . Hypertension Other   . Diabetes Other   . Stroke Other   . Cancer Other   . Heart failure Other   . Seizures Other      Exam    Uterus:     Pelvic Exam:    Perineum: No Hemorrhoids   Vulva: normal   Vagina:  normal mucosa   pH:     Cervix: no lesions   Adnexa: no mass, fullness, tenderness   Bony Pelvis:  average  System: Breast:  normal appearance, no masses or tenderness   Skin: normal coloration and turgor, no rashes    Neurologic: oriented, normal mood   Extremities: normal strength, tone, and muscle mass   HEENT PERRLA, extra ocular movement intact, sclera clear, anicteric, oropharynx clear, no lesions and thyroid without masses   Mouth/Teeth dental hygiene good   Neck supple and no masses   Cardiovascular: regular rate and rhythm, no murmurs or gallops   Respiratory:  appears well, vitals normal, no respiratory distress, acyanotic, normal RR, neck free of mass or lymphadenopathy, chest clear, no wheezing, crepitations, rhonchi, normal symmetric air entry   Abdomen: soft, non-tender; bowel sounds normal; no masses,  no organomegaly   Urinary: urethral meatus normal      Assessment:    Pregnancy: [id number] Patient Active Problem List   Diagnosis Date Noted  . Hyperemesis affecting pregnancy, antepartum 02/16/2017  . Supervision of high-risk pregnancy of young multigravida 01/24/2017  . Obesity  affecting pregnancy, antepartum 01/24/2017  . Vaginal bleeding during pregnancy, antepartum 01/24/2017        Plan:     Initial labs drawn. Prenatal vitamins. Problem list reviewed and updated. Genetic Screening discussed MaterniT 21 ordered  Ultrasound discussed; fetal survey: 18+ weeks.  Follow up in 4 weeks. 50% of 30 min visit spent on counseling and coordination of care.  Protonix and Zofran ordered   Scheryl Darter 02/16/2017

## 2017-02-16 NOTE — Patient Instructions (Signed)
First Trimester of Pregnancy The first trimester of pregnancy is from week 1 until the end of week 13 (months 1 through 3). A week after a sperm fertilizes an egg, the egg will implant on the wall of the uterus. This embryo will begin to develop into a baby. Genes from you and your partner will form the baby. The female genes will determine whether the baby will be a boy or a girl. At 6-8 weeks, the eyes and face will be formed, and the heartbeat can be seen on ultrasound. At the end of 12 weeks, all the baby's organs will be formed. Now that you are pregnant, you will want to do everything you can to have a healthy baby. Two of the most important things are to get good prenatal care and to follow your health care provider's instructions. Prenatal care is all the medical care you receive before the baby's birth. This care will help prevent, find, and treat any problems during the pregnancy and childbirth. Body changes during your first trimester Your body goes through many changes during pregnancy. The changes vary from woman to woman.  You may gain or lose a couple of pounds at first.  You may feel sick to your stomach (nauseous) and you may throw up (vomit). If the vomiting is uncontrollable, call your health care provider.  You may tire easily.  You may develop headaches that can be relieved by medicines. All medicines should be approved by your health care provider.  You may urinate more often. Painful urination may mean you have a bladder infection.  You may develop heartburn as a result of your pregnancy.  You may develop constipation because certain hormones are causing the muscles that push stool through your intestines to slow down.  You may develop hemorrhoids or swollen veins (varicose veins).  Your breasts may begin to grow larger and become tender. Your nipples may stick out more, and the tissue that surrounds them (areola) may become darker.  Your gums may bleed and may be  sensitive to brushing and flossing.  Dark spots or blotches (chloasma, mask of pregnancy) may develop on your face. This will likely fade after the baby is born.  Your menstrual periods will stop.  You may have a loss of appetite.  You may develop cravings for certain kinds of food.  You may have changes in your emotions from day to day, such as being excited to be pregnant or being concerned that something may go wrong with the pregnancy and baby.  You may have more vivid and strange dreams.  You may have changes in your hair. These can include thickening of your hair, rapid growth, and changes in texture. Some women also have hair loss during or after pregnancy, or hair that feels dry or thin. Your hair will most likely return to normal after your baby is born.  What to expect at prenatal visits During a routine prenatal visit:  You will be weighed to make sure you and the baby are growing normally.  Your blood pressure will be taken.  Your abdomen will be measured to track your baby's growth.  The fetal heartbeat will be listened to between weeks 10 and 14 of your pregnancy.  Test results from any previous visits will be discussed.  Your health care provider may ask you:  How you are feeling.  If you are feeling the baby move.  If you have had any abnormal symptoms, such as leaking fluid, bleeding, severe headaches,   or abdominal cramping.  If you are using any tobacco products, including cigarettes, chewing tobacco, and electronic cigarettes.  If you have any questions.  Other tests that may be performed during your first trimester include:  Blood tests to find your blood type and to check for the presence of any previous infections. The tests will also be used to check for low iron levels (anemia) and protein on red blood cells (Rh antibodies). Depending on your risk factors, or if you previously had diabetes during pregnancy, you may have tests to check for high blood  sugar that affects pregnant women (gestational diabetes).  Urine tests to check for infections, diabetes, or protein in the urine.  An ultrasound to confirm the proper growth and development of the baby.  Fetal screens for spinal cord problems (spina bifida) and Down syndrome.  HIV (human immunodeficiency virus) testing. Routine prenatal testing includes screening for HIV, unless you choose not to have this test.  You may need other tests to make sure you and the baby are doing well.  Follow these instructions at home: Medicines  Follow your health care provider's instructions regarding medicine use. Specific medicines may be either safe or unsafe to take during pregnancy.  Take a prenatal vitamin that contains at least 600 micrograms (mcg) of folic acid.  If you develop constipation, try taking a stool softener if your health care provider approves. Eating and drinking  Eat a balanced diet that includes fresh fruits and vegetables, whole grains, good sources of protein such as meat, eggs, or tofu, and low-fat dairy. Your health care provider will help you determine the amount of weight gain that is right for you.  Avoid raw meat and uncooked cheese. These carry germs that can cause birth defects in the baby.  Eating four or five small meals rather than three large meals a day may help relieve nausea and vomiting. If you start to feel nauseous, eating a few soda crackers can be helpful. Drinking liquids between meals, instead of during meals, also seems to help ease nausea and vomiting.  Limit foods that are high in fat and processed sugars, such as fried and sweet foods.  To prevent constipation: ? Eat foods that are high in fiber, such as fresh fruits and vegetables, whole grains, and beans. ? Drink enough fluid to keep your urine clear or pale yellow. Activity  Exercise only as directed by your health care provider. Most women can continue their usual exercise routine during  pregnancy. Try to exercise for 30 minutes at least 5 days a week. Exercising will help you: ? Control your weight. ? Stay in shape. ? Be prepared for labor and delivery.  Experiencing pain or cramping in the lower abdomen or lower back is a good sign that you should stop exercising. Check with your health care provider before continuing with normal exercises.  Try to avoid standing for long periods of time. Move your legs often if you must stand in one place for a long time.  Avoid heavy lifting.  Wear low-heeled shoes and practice good posture.  You may continue to have sex unless your health care provider tells you not to. Relieving pain and discomfort  Wear a good support bra to relieve breast tenderness.  Take warm sitz baths to soothe any pain or discomfort caused by hemorrhoids. Use hemorrhoid cream if your health care provider approves.  Rest with your legs elevated if you have leg cramps or low back pain.  If you develop   varicose veins in your legs, wear support hose. Elevate your feet for 15 minutes, 3-4 times a day. Limit salt in your diet. Prenatal care  Schedule your prenatal visits by the twelfth week of pregnancy. They are usually scheduled monthly at first, then more often in the last 2 months before delivery.  Write down your questions. Take them to your prenatal visits.  Keep all your prenatal visits as told by your health care provider. This is important. Safety  Wear your seat belt at all times when driving.  Make a list of emergency phone numbers, including numbers for family, friends, the hospital, and police and fire departments. General instructions  Ask your health care provider for a referral to a local prenatal education class. Begin classes no later than the beginning of month 6 of your pregnancy.  Ask for help if you have counseling or nutritional needs during pregnancy. Your health care provider can offer advice or refer you to specialists for help  with various needs.  Do not use hot tubs, steam rooms, or saunas.  Do not douche or use tampons or scented sanitary pads.  Do not cross your legs for long periods of time.  Avoid cat litter boxes and soil used by cats. These carry germs that can cause birth defects in the baby and possibly loss of the fetus by miscarriage or stillbirth.  Avoid all smoking, herbs, alcohol, and medicines not prescribed by your health care provider. Chemicals in these products affect the formation and growth of the baby.  Do not use any products that contain nicotine or tobacco, such as cigarettes and e-cigarettes. If you need help quitting, ask your health care provider. You may receive counseling support and other resources to help you quit.  Schedule a dentist appointment. At home, brush your teeth with a soft toothbrush and be gentle when you floss. Contact a health care provider if:  You have dizziness.  You have mild pelvic cramps, pelvic pressure, or nagging pain in the abdominal area.  You have persistent nausea, vomiting, or diarrhea.  You have a bad smelling vaginal discharge.  You have pain when you urinate.  You notice increased swelling in your face, hands, legs, or ankles.  You are exposed to fifth disease or chickenpox.  You are exposed to German measles (rubella) and have never had it. Get help right away if:  You have a fever.  You are leaking fluid from your vagina.  You have spotting or bleeding from your vagina.  You have severe abdominal cramping or pain.  You have rapid weight gain or loss.  You vomit blood or material that looks like coffee grounds.  You develop a severe headache.  You have shortness of breath.  You have any kind of trauma, such as from a fall or a car accident. Summary  The first trimester of pregnancy is from week 1 until the end of week 13 (months 1 through 3).  Your body goes through many changes during pregnancy. The changes vary from  woman to woman.  You will have routine prenatal visits. During those visits, your health care provider will examine you, discuss any test results you may have, and talk with you about how you are feeling. This information is not intended to replace advice given to you by your health care provider. Make sure you discuss any questions you have with your health care provider. Document Released: 05/03/2001 Document Revised: 04/20/2016 Document Reviewed: 04/20/2016 Elsevier Interactive Patient Education  2017 [employer]  

## 2017-02-18 LAB — URINE CULTURE, OB REFLEX

## 2017-02-18 LAB — CULTURE, OB URINE

## 2017-02-20 ENCOUNTER — Encounter (HOSPITAL_COMMUNITY): Payer: Self-pay | Admitting: *Deleted

## 2017-02-20 ENCOUNTER — Inpatient Hospital Stay (HOSPITAL_COMMUNITY)
Admission: AD | Admit: 2017-02-20 | Discharge: 2017-02-20 | Disposition: A | Discharge status: Home / Self Care | Payer: 59 | Source: Ambulatory Visit | Attending: Obstetrics & Gynecology | Admitting: Obstetrics & Gynecology

## 2017-02-20 DIAGNOSIS — O26891 Other specified pregnancy related conditions, first trimester: Secondary | ICD-10-CM | POA: Diagnosis not present

## 2017-02-20 DIAGNOSIS — F419 Anxiety disorder, unspecified: Secondary | ICD-10-CM | POA: Diagnosis not present

## 2017-02-20 DIAGNOSIS — Z3A11 11 weeks gestation of pregnancy: Secondary | ICD-10-CM | POA: Diagnosis not present

## 2017-02-20 DIAGNOSIS — O99341 Other mental disorders complicating pregnancy, first trimester: Secondary | ICD-10-CM | POA: Insufficient documentation

## 2017-02-20 DIAGNOSIS — R109 Unspecified abdominal pain: Secondary | ICD-10-CM | POA: Diagnosis not present

## 2017-02-20 DIAGNOSIS — O3680X0 Pregnancy with inconclusive fetal viability, not applicable or unspecified: Secondary | ICD-10-CM

## 2017-02-20 DIAGNOSIS — O09629 Supervision of young multigravida, unspecified trimester: Secondary | ICD-10-CM

## 2017-02-20 DIAGNOSIS — O4690 Antepartum hemorrhage, unspecified, unspecified trimester: Secondary | ICD-10-CM

## 2017-02-20 DIAGNOSIS — O99210 Obesity complicating pregnancy, unspecified trimester: Secondary | ICD-10-CM

## 2017-02-20 LAB — URINALYSIS, ROUTINE W REFLEX MICROSCOPIC
BILIRUBIN URINE: NEGATIVE
GLUCOSE, UA: NEGATIVE mg/dL
KETONES UR: NEGATIVE mg/dL
NITRITE: NEGATIVE
PH: 6 (ref 5.0–8.0)
Protein, ur: NEGATIVE mg/dL
Specific Gravity, Urine: 1.025 (ref 1.005–1.030)

## 2017-02-20 LAB — URINALYSIS, MICROSCOPIC (REFLEX)

## 2017-02-20 MED ORDER — ACETAMINOPHEN 325 MG PO TABS
650.0000 mg | ORAL_TABLET | Freq: Once | ORAL | Status: AC
  Administered 2017-02-20: 650 mg via ORAL
  Filled 2017-02-20: qty 2

## 2017-02-20 NOTE — MAU Provider Note (Signed)
History     CSN: [id number]  Arrival date and time: 02/20/17 1237   First Provider Initiated Contact with Patient 02/20/17 1706      Chief Complaint  Patient presents with  . Abdominal Pain   HPI Carla [name] is a 26 y/o G5P1031 with EGA of 104w2d by U/S with history of subchorionic hemorrhage and hyperemesis gravida who presents to the MAU for evaluation of left suprapubic abdominal pain. Pain began this morning at 0700 hours and awoke her from sleep. She describes pain as sharp and intermittent stating. Patient denies vaginal bleeding/discharge, fever, chills, or changes in bowel movements. Patient admits to nausea and vomiting but states this is typical for her secondary to hyperemesis. She took reglan at 1100 hours this morning with some relief of nausea. Because of her subchorionic hemorrhage, she was told to present here to the MAU if she has severe abdominal pain.    Past Medical History:  Diagnosis Date  . Allergy   . Anemia   . Depression   . Headache(784.0)   . PONV (postoperative nausea and vomiting)   . Spontaneous abortion 12/03/2010    Past Surgical History:  Procedure Laterality Date  . CHOLECYSTECTOMY N/A 05/13/2015   Procedure: LAPAROSCOPIC CHOLECYSTECTOMY ;  Surgeon: Manus Rudd, MD;  Location: [facility] OR;  Service: General;  Laterality: N/A;  . DILATION AND EVACUATION  12/03/2010   Procedure: DILATATION AND EVACUATION (D&E);  Surgeon: Roseanna Rainbow, MD;  Location: [facility] ORS;  Service: Gynecology;  Laterality: N/A;  . OVARIAN CYST REMOVAL    . TONSILLECTOMY      Family History  Problem Relation Age of Onset  . Hypertension Other   . Diabetes Other   . Stroke Other   . Cancer Other   . Heart failure Other   . Seizures Other     Social History  Substance Use Topics  . Smoking status: Never Smoker  . Smokeless tobacco: Never Used  . Alcohol use No     Comment: Ocassionally    Allergies:  Allergies  Allergen Reactions  . Shellfish Allergy  Anaphylaxis  . Latex Itching    Prescriptions Prior to Admission  Medication Sig Dispense Refill Last Dose  . acetaminophen (TYLENOL) 500 MG tablet Take 1,000 mg by mouth every 6 (six) hours as needed for moderate pain or fever. Reported on 11/26/2015   Not Taking  . Doxylamine-Pyridoxine 10-10 MG TBEC Take 2 tabs at bedtime. If symptoms persist after 2 days, take 1 tab in the morning and 2 tabs at bedtime (Patient not taking: Reported on 02/16/2017) 100 tablet 6 Not Taking  . folic acid (FOLVITE) 1 MG tablet Take 2 tablets (2 mg total) by mouth daily. 60 tablet 11 Taking  . metoCLOPramide (REGLAN) 10 MG tablet TAKE 1 TABLET BY MOUTH EVERY 8 HOURS AS NEEDED FOR NAUSEA AND VOMITING 30 tablet 0   . metoCLOPramide (REGLAN) 10 MG tablet Take 1 tablet (10 mg total) by mouth every 8 (eight) hours as needed for nausea. 30 tablet 0   . ondansetron (ZOFRAN ODT) 4 MG disintegrating tablet Take 1 tablet (4 mg total) by mouth every 6 (six) hours as needed for nausea. 20 tablet 2   . pantoprazole (PROTONIX) 40 MG tablet Take 1 tablet (40 mg total) by mouth daily. 30 tablet 3   . promethazine (PHENERGAN) 25 MG suppository Place 1 suppository (25 mg total) rectally every 6 (six) hours as needed for nausea or vomiting. (Patient not taking: Reported on 02/16/2017) 12  each 0 Not Taking    Review of Systems  Constitutional: Negative for chills and fever.  Gastrointestinal: Positive for abdominal pain, nausea and vomiting. Negative for abdominal distention, constipation and diarrhea.  Genitourinary: Negative for vaginal bleeding and vaginal discharge.   Physical Exam   Blood pressure 124/84, pulse 79, temperature 98 F (36.7 C), temperature source Oral, resp. rate 18, weight 88.8 kg (195 lb 12 oz), last menstrual period 11/13/2016, SpO2 100 %.  Physical Exam  Constitutional: She is oriented to person, place, and time. She appears well-developed and well-nourished. No distress.  HENT:  Head: Normocephalic and  atraumatic.  Cardiovascular: Normal rate, regular rhythm and normal heart sounds.  Exam reveals no gallop and no friction rub.   No murmur heard. Respiratory: Effort normal and breath sounds normal. She has no wheezes. She has no rales. She exhibits no tenderness.  GI: She exhibits no distension and no mass. There is tenderness. There is no rebound and no guarding.  Musculoskeletal: She exhibits no edema.  Neurological: She is alert and oriented to person, place, and time.  Skin: Skin is warm and dry.  Psychiatric: She has a normal mood and affect.   Results for orders placed or performed during the hospital encounter of 02/20/17 (from the past 24 hour(s))  Urinalysis, Routine w reflex microscopic     Status: Abnormal   Collection Time: 02/20/17  1:26 PM  Result Value Ref Range   Color, Urine YELLOW YELLOW   APPearance HAZY (A) CLEAR   Specific Gravity, Urine 1.025 1.005 - 1.030   pH 6.0 5.0 - 8.0   Glucose, UA NEGATIVE NEGATIVE mg/dL   Hgb urine dipstick MODERATE (A) NEGATIVE   Bilirubin Urine NEGATIVE NEGATIVE   Ketones, ur NEGATIVE NEGATIVE mg/dL   Protein, ur NEGATIVE NEGATIVE mg/dL   Nitrite NEGATIVE NEGATIVE   Leukocytes, UA TRACE (A) NEGATIVE  Urinalysis, Microscopic (reflex)     Status: Abnormal   Collection Time: 02/20/17  1:26 PM  Result Value Ref Range   RBC / HPF 6-30 0 - 5 RBC/hpf   WBC, UA 0-5 0 - 5 WBC/hpf   Bacteria, UA FEW (A) NONE SEEN   Squamous Epithelial / LPF 0-5 (A) NONE SEEN   Mucus PRESENT    Results for orders placed or performed during the hospital encounter of 02/20/17 (from the past 24 hour(s))  Urinalysis, Routine w reflex microscopic     Status: Abnormal   Collection Time: 02/20/17  1:26 PM  Result Value Ref Range   Color, Urine YELLOW YELLOW   APPearance HAZY (A) CLEAR   Specific Gravity, Urine 1.025 1.005 - 1.030   pH 6.0 5.0 - 8.0   Glucose, UA NEGATIVE NEGATIVE mg/dL   Hgb urine dipstick MODERATE (A) NEGATIVE   Bilirubin Urine NEGATIVE  NEGATIVE   Ketones, ur NEGATIVE NEGATIVE mg/dL   Protein, ur NEGATIVE NEGATIVE mg/dL   Nitrite NEGATIVE NEGATIVE   Leukocytes, UA TRACE (A) NEGATIVE  Urinalysis, Microscopic (reflex)     Status: Abnormal   Collection Time: 02/20/17  1:26 PM  Result Value Ref Range   RBC / HPF 6-30 0 - 5 RBC/hpf   WBC, UA 0-5 0 - 5 WBC/hpf   Bacteria, UA FEW (A) NONE SEEN   Squamous Epithelial / LPF 0-5 (A) NONE SEEN   Mucus PRESENT     MAU Course  Procedures   Course:  Patient requested something for pain, agreed to wait until after ultrasound  Reviewed all laboratory results with patient,  see results above  Urine culture pending  Bedside ultrasound performed by Vonzella Nipple, PA-C revealed viable intrauterine pregnancy with adequate heart beat   Patient and family reassured  Patient given acetaminophen 650 mg PO   All questions and concerns answered   Patient advised to follow up with Famina on 10/25  Patient discharged from MAU in stable condition    Assessment and Plan  A Intrauterine Pregnancy, 11 weeks Left sided abdominal pain in pregnancy  H/o subchorionic hemorrhage  Anxiety related to previous pregnancy losses   P Tylenol 1 g PRN for pain Follow up with Famina at appt on 10/25 Return here in case of emergency Patient discharged home   Dyke Maes Cimolino PA-S  I have reviewed the note and agree with student's assessment.  I personally examined the patient, reviewed labs and made plan of care.   02/20/2017, 5:48 PM

## 2017-02-20 NOTE — MAU Note (Addendum)
Woke up this morning with severe cramping in LLQ. Feels better if she holds is, can't get comfortable. Has confirmed IUP. Has hyperemesis

## 2017-02-20 NOTE — MAU Provider Note (Signed)
Pt informed that the ultrasound is considered a limited OB ultrasound and is not intended to be a complete ultrasound exam.  Patient also informed that the ultrasound is not being completed with the intent of assessing for fetal or placental anomalies or any pelvic abnormalities.  Explained that the purpose of today's ultrasound is to assess for  viability.  Patient acknowledges the purpose of the exam and the limitations of the study.    Active appearing fetus noted on [place] with normal appears FHR.   Marny Lowenstein, PA-C 02/20/2017 6:26 PM

## 2017-02-20 NOTE — MAU Note (Signed)
Pt presents with c/o left lower abdominal pain that began this morning.  Pt reports pain as intermittent cramping.  Denies VB or LOF.

## 2017-02-20 NOTE — MAU Provider Note (Signed)
History     CSN: [id number]  Arrival date and time: 02/20/17 1237   None     Chief Complaint  Patient presents with  . Abdominal Pain   HPI Carla [name] is 26 y.o. [id number] 110w2d weeks presenting with abdominal pain mainly on the left.  She describes sharp intermittent pain since 7am.  Rates as 8/10 at this time.  Holding her left side helps.  She has not taken anything for pain. Neg for vaginal bleeding.  She is a patient of [facility].  Has next app on 10/25.  Hx of ovarian cysts.  Early pregnancy U/S showed a moderate subchorionic hemorrhage.  Has not had further vaginal bleeding.  Denies constipation. + for GERD, takes Reglan.  She is very concerned about the viability of the pregnancy bc of hx of 3 miscarriages and has not heard the heartbeat.     Past Medical History:  Diagnosis Date  . Allergy   . Anemia   . Depression   . Headache(784.0)   . PONV (postoperative nausea and vomiting)   . Spontaneous abortion 12/03/2010    Past Surgical History:  Procedure Laterality Date  . CHOLECYSTECTOMY N/A 05/13/2015   Procedure: LAPAROSCOPIC CHOLECYSTECTOMY ;  Surgeon: Manus Rudd, MD;  Location: [facility] OR;  Service: General;  Laterality: N/A;  . DILATION AND EVACUATION  12/03/2010   Procedure: DILATATION AND EVACUATION (D&E);  Surgeon: Roseanna Rainbow, MD;  Location: [facility];  Service: Gynecology;  Laterality: N/A;  . OVARIAN CYST REMOVAL    . TONSILLECTOMY      Family History  Problem Relation Age of Onset  . Hypertension Other   . Diabetes Other   . Stroke Other   . Cancer Other   . Heart failure Other   . Seizures Other     Social History  Substance Use Topics  . Smoking status: Never Smoker  . Smokeless tobacco: Never Used  . Alcohol use No     Comment: Ocassionally    Allergies:  Allergies  Allergen Reactions  . Shellfish Allergy Anaphylaxis  . Latex Itching    Prescriptions Prior to Admission  Medication Sig Dispense Refill Last Dose  . acetaminophen  (TYLENOL) 500 MG tablet Take 1,000 mg by mouth every 6 (six) hours as needed for moderate pain or fever. Reported on 11/26/2015   Not Taking  . Doxylamine-Pyridoxine 10-10 MG TBEC Take 2 tabs at bedtime. If symptoms persist after 2 days, take 1 tab in the morning and 2 tabs at bedtime (Patient not taking: Reported on 02/16/2017) 100 tablet 6 Not Taking  . folic acid (FOLVITE) 1 MG tablet Take 2 tablets (2 mg total) by mouth daily. 60 tablet 11 Taking  . metoCLOPramide (REGLAN) 10 MG tablet TAKE 1 TABLET BY MOUTH EVERY 8 HOURS AS NEEDED FOR NAUSEA AND VOMITING 30 tablet 0   . metoCLOPramide (REGLAN) 10 MG tablet Take 1 tablet (10 mg total) by mouth every 8 (eight) hours as needed for nausea. 30 tablet 0   . ondansetron (ZOFRAN ODT) 4 MG disintegrating tablet Take 1 tablet (4 mg total) by mouth every 6 (six) hours as needed for nausea. 20 tablet 2   . pantoprazole (PROTONIX) 40 MG tablet Take 1 tablet (40 mg total) by mouth daily. 30 tablet 3   . promethazine (PHENERGAN) 25 MG suppository Place 1 suppository (25 mg total) rectally every 6 (six) hours as needed for nausea or vomiting. (Patient not taking: Reported on 02/16/2017) 12 each 0 Not Taking  Review of Systems  Constitutional: Negative for activity change, appetite change and fever.  Respiratory: Negative for shortness of breath.   Cardiovascular: Negative for chest pain.  Gastrointestinal: Positive for abdominal pain (lower left).  Genitourinary: Positive for pelvic pain (lower left). Negative for vaginal bleeding and vaginal discharge.  Musculoskeletal: Negative for back pain.   Physical Exam   Blood pressure 118/81, pulse 79, temperature 98.3 F (36.8 C), temperature source Oral, resp. rate 16, weight 195 lb 12 oz (88.8 kg), last menstrual period 11/13/2016, SpO2 100 %.  Physical Exam  Nursing note and vitals reviewed. Constitutional: She appears well-developed and well-nourished. No distress.  HENT:  Head: Normocephalic.  Neck:  Normal range of motion.  Cardiovascular: Normal rate and regular rhythm.   Respiratory: Effort normal and breath sounds normal.  GI: Soft. She exhibits no distension and no mass. There is no tenderness. There is no rebound and no guarding.  Genitourinary: There is no rash, tenderness or lesion on the right labia. There is no rash, tenderness or lesion on the left labia. Uterus is enlarged (10 week size). Uterus is not tender. Cervix exhibits no motion tenderness, no discharge and no friability. Right adnexum displays no mass, no tenderness and no fullness. Left adnexum displays no mass, no tenderness and no fullness. No erythema, tenderness or bleeding in the vagina. No vaginal discharge found.   Results for orders placed or performed during the hospital encounter of 02/20/17 (from the past 24 hour(s))  Urinalysis, Routine w reflex microscopic     Status: Abnormal   Collection Time: 02/20/17  1:26 PM  Result Value Ref Range   Color, Urine YELLOW YELLOW   APPearance HAZY (A) CLEAR   Specific Gravity, Urine 1.025 1.005 - 1.030   pH 6.0 5.0 - 8.0   Glucose, UA NEGATIVE NEGATIVE mg/dL   Hgb urine dipstick MODERATE (A) NEGATIVE   Bilirubin Urine NEGATIVE NEGATIVE   Ketones, ur NEGATIVE NEGATIVE mg/dL   Protein, ur NEGATIVE NEGATIVE mg/dL   Nitrite NEGATIVE NEGATIVE   Leukocytes, UA TRACE (A) NEGATIVE  Urinalysis, Microscopic (reflex)     Status: Abnormal   Collection Time: 02/20/17  1:26 PM  Result Value Ref Range   RBC / HPF 6-30 0 - 5 RBC/hpf   WBC, UA 0-5 0 - 5 WBC/hpf   Bacteria, UA FEW (A) NONE SEEN   Squamous Epithelial / LPF 0-5 (A) NONE SEEN   Mucus PRESENT     MAU Course  Procedures  Urine culture to lab  [facility] Labs Exam Bedside Ultrasound performed by Vonzella Nipple, PA--+ fetal heart beat seen, active fetus. Tylenol  PO given in MAU for discomfort    Assessment and Plan  A:  Left sided pain in first trimester pregnancy      Hx of moderate subchorionic  hemorrhage      Anxiety related to past pregnancy losses  P:  Patient reassured of fetal viability       Encouraged to stay well hydrated       Will report urine culture if positive       Keep scheduled appt for 10/25.         Dennison Mascot Key 02/20/2017, 4:38 PM

## 2017-02-20 NOTE — Discharge Instructions (Signed)

## 2017-02-21 ENCOUNTER — Telehealth: Payer: Self-pay

## 2017-02-21 LAB — VARICELLA ZOSTER ANTIBODY, IGG: Varicella zoster IgG: 1364 index (ref >165)

## 2017-02-21 LAB — MATERNIT 21 PLUS CORE, BLOOD
CHROMOSOME 21: NEGATIVE
Chromosome 13: NEGATIVE
Chromosome 18: NEGATIVE
Y Chromosome: NOT DETECTED

## 2017-02-21 LAB — OBSTETRIC PANEL, INCLUDING HIV
Antibody Screen: NEGATIVE
BASOS ABS: 0 10*3/uL (ref 0.0–0.2)
Basos: 0 %
EOS (ABSOLUTE): 0 10*3/uL (ref 0.0–0.4)
Eos: 0 %
HIV SCREEN 4TH GENERATION: NONREACTIVE
Hematocrit: 39 % (ref 34.0–46.6)
Hemoglobin: 13.6 g/dL (ref 11.1–15.9)
Hepatitis B Surface Ag: NEGATIVE
Immature Grans (Abs): 0 10*3/uL (ref 0.0–0.1)
Immature Granulocytes: 0 %
LYMPHS ABS: 2.3 10*3/uL (ref 0.7–3.1)
Lymphs: 25 %
MCH: 30.6 pg (ref 26.6–33.0)
MCHC: 34.9 g/dL (ref 31.5–35.7)
MCV: 88 fL (ref 79–97)
Monocytes Absolute: 0.5 10*3/uL (ref 0.1–0.9)
Monocytes: 6 %
NEUTROS ABS: 6.3 10*3/uL (ref 1.4–7.0)
Neutrophils: 69 %
PLATELETS: 258 10*3/uL (ref 150–379)
RBC: 4.44 x10E6/uL (ref 3.77–5.28)
RDW: 14.6 % (ref 12.3–15.4)
RPR Ser Ql: NONREACTIVE
Rh Factor: POSITIVE
Rubella Antibodies, IGG: 1.71 index (ref >0.99)
WBC: 9.2 10*3/uL (ref 3.4–10.8)

## 2017-02-21 LAB — HEMOGLOBINOPATHY EVALUATION
HEMOGLOBIN A2 QUANTITATION: 1.3 % — AB (ref 1.8–3.2)
HGB A: 98 % (ref 96.4–98.8)
HGB C: 0 %
HGB S: 0 %
HGB VARIANT: 0 %
Hemoglobin F Quantitation: 0.7 % (ref 0.0–2.0)

## 2017-02-21 LAB — URINE CULTURE: Culture: NO GROWTH

## 2017-02-21 LAB — VITAMIN D 25 HYDROXY (VIT D DEFICIENCY, FRACTURES): Vit D, 25-Hydroxy: 16.8 ng/mL — ABNORMAL LOW (ref 30.0–100.0)

## 2017-02-21 NOTE — Telephone Encounter (Signed)
Contacted patient to get info about FMLA paperwork, patient then stated that she was at [facility] yesterday with abdominal pain and pain med is not working so she would like an earlier appt in our office, advised pt that I would have scheduler check and call her back.

## 2017-02-22 ENCOUNTER — Encounter: Payer: Self-pay | Admitting: *Deleted

## 2017-02-27 ENCOUNTER — Telehealth: Payer: Self-pay

## 2017-02-27 ENCOUNTER — Inpatient Hospital Stay (HOSPITAL_COMMUNITY)
Admission: AD | Admit: 2017-02-27 | Discharge: 2017-02-27 | Disposition: A | Discharge status: Home / Self Care | Payer: 59 | Source: Ambulatory Visit | Attending: Obstetrics and Gynecology | Admitting: Obstetrics and Gynecology

## 2017-02-27 ENCOUNTER — Encounter (HOSPITAL_COMMUNITY): Payer: Self-pay | Admitting: *Deleted

## 2017-02-27 DIAGNOSIS — Z3A12 12 weeks gestation of pregnancy: Secondary | ICD-10-CM | POA: Insufficient documentation

## 2017-02-27 DIAGNOSIS — O99210 Obesity complicating pregnancy, unspecified trimester: Secondary | ICD-10-CM

## 2017-02-27 DIAGNOSIS — M545 Low back pain, unspecified: Secondary | ICD-10-CM

## 2017-02-27 DIAGNOSIS — O09629 Supervision of young multigravida, unspecified trimester: Secondary | ICD-10-CM

## 2017-02-27 DIAGNOSIS — O4690 Antepartum hemorrhage, unspecified, unspecified trimester: Secondary | ICD-10-CM

## 2017-02-27 DIAGNOSIS — O26891 Other specified pregnancy related conditions, first trimester: Secondary | ICD-10-CM | POA: Diagnosis not present

## 2017-02-27 LAB — URINALYSIS, ROUTINE W REFLEX MICROSCOPIC
Bacteria, UA: NONE SEEN
GLUCOSE, UA: NEGATIVE mg/dL
Hgb urine dipstick: NEGATIVE
KETONES UR: NEGATIVE mg/dL
Leukocytes, UA: NEGATIVE
Nitrite: NEGATIVE
PH: 5 (ref 5.0–8.0)
Protein, ur: 100 mg/dL — AB
Specific Gravity, Urine: 1.032 — ABNORMAL HIGH (ref 1.005–1.030)

## 2017-02-27 NOTE — Discharge Instructions (Signed)
Back Pain in Pregnancy Back pain during pregnancy is common. Back pain may be caused by several factors that are related to changes during your pregnancy. Follow these instructions at home: Managing pain, stiffness, and swelling  If directed, apply ice for sudden (acute) back pain. ? Put ice in a plastic bag. ? Place a towel between your skin and the bag. ? Leave the ice on for 20 minutes, 2-3 times per day.  If directed, apply heat to the affected area before you exercise: ? Place a towel between your skin and the heat pack or heating pad. ? Leave the heat on for 20-30 minutes. ? Remove the heat if your skin turns bright red. This is especially important if you are unable to feel pain, heat, or cold. You may have a greater risk of getting burned. Activity  Exercise as told by your health care provider. Exercising is the best way to prevent or manage back pain.  Listen to your body when lifting. If lifting hurts, ask for help or bend your knees. This uses your leg muscles instead of your back muscles.  Squat down when picking up something from the floor. Do not bend over.  Only use bed rest as told by your health care provider. Bed rest should only be used for the most severe episodes of back pain. Standing, Sitting, and Lying Down  Do not stand in one place for long periods of time.  Use good posture when sitting. Make sure your head rests over your shoulders and is not hanging forward. Use a pillow on your lower back if necessary.  Try sleeping on your side, preferably the left side, with a pillow or two between your legs. If you are sore after a night's rest, your bed may be too soft. A firm mattress may provide more support for your back during pregnancy. General instructions  Do not wear high heels.  Eat a healthy diet. Try to gain weight within your health care provider's recommendations.  Use a maternity girdle, elastic sling, or back brace as told by your health care  provider.  Take over-the-counter and prescription medicines only as told by your health care provider.  Keep all follow-up visits as told by your health care provider. This is important. This includes any visits with any specialists, such as a physical therapist. Contact a health care provider if:  Your back pain interferes with your daily activities.  You have increasing pain in other parts of your body. Get help right away if:  You develop numbness, tingling, weakness, or problems with the use of your arms or legs.  You develop severe back pain that is not controlled with medicine.  You have a sudden change in bowel or bladder control.  You develop shortness of breath, dizziness, or you faint.  You develop nausea, vomiting, or sweating.  You have back pain that is a rhythmic, cramping pain similar to labor pains. Labor pain is usually 1-2 minutes apart, lasts for about 1 minute, and involves a bearing down feeling or pressure in your pelvis.  You have back pain and your water breaks or you have vaginal bleeding.  You have back pain or numbness that travels down your leg.  Your back pain developed after you fell.  You develop pain on one side of your back.  You see blood in your urine.  You develop skin blisters in the area of your back pain. This information is not intended to replace advice given to you   by your health care provider. Make sure you discuss any questions you have with your health care provider. Document Released: 08/17/2005 Document Revised: 10/15/2015 Document Reviewed: 01/21/2015 Elsevier Interactive Patient Education  2018 [employer]  

## 2017-02-27 NOTE — MAU Note (Signed)
Pt presents with c/o constant lower back pain.  Reports pain is stabbing and began this morning.  Pt states heat has helped, but pain remains.  Hasn't taken Tylenol.

## 2017-02-27 NOTE — Telephone Encounter (Signed)
Pt called complaining of severe lower back pain that has been going on since early this am. Pt advised to go to MAU for evaluation

## 2017-03-02 ENCOUNTER — Other Ambulatory Visit: Payer: Self-pay

## 2017-03-02 DIAGNOSIS — O210 Mild hyperemesis gravidarum: Secondary | ICD-10-CM

## 2017-03-02 MED ORDER — METOCLOPRAMIDE HCL 10 MG PO TABS: ORAL_TABLET | ORAL | 5 refills | Status: DC

## 2017-03-07 ENCOUNTER — Encounter: Payer: Self-pay | Admitting: Obstetrics

## 2017-03-07 ENCOUNTER — Ambulatory Visit (INDEPENDENT_AMBULATORY_CARE_PROVIDER_SITE_OTHER): Payer: 59 | Admitting: Obstetrics

## 2017-03-07 ENCOUNTER — Other Ambulatory Visit: Payer: Self-pay | Admitting: Obstetrics

## 2017-03-07 VITALS — BP 113/77 | HR 84 | Wt 191.6 lb

## 2017-03-07 DIAGNOSIS — O219 Vomiting of pregnancy, unspecified: Secondary | ICD-10-CM

## 2017-03-07 DIAGNOSIS — O418X10 Other specified disorders of amniotic fluid and membranes, first trimester, not applicable or unspecified: Secondary | ICD-10-CM

## 2017-03-07 DIAGNOSIS — O4690 Antepartum hemorrhage, unspecified, unspecified trimester: Secondary | ICD-10-CM

## 2017-03-07 DIAGNOSIS — O468X1 Other antepartum hemorrhage, first trimester: Secondary | ICD-10-CM

## 2017-03-07 DIAGNOSIS — O09629 Supervision of young multigravida, unspecified trimester: Secondary | ICD-10-CM

## 2017-03-07 DIAGNOSIS — O09621 Supervision of young multigravida, first trimester: Secondary | ICD-10-CM

## 2017-03-07 MED ORDER — VITAMIN B-6 50 MG PO TABS: 50.0000 mg | ORAL_TABLET | Freq: Two times a day (BID) | ORAL | 6 refills | Status: DC

## 2017-03-07 MED ORDER — DOXYLAMINE SUCCINATE (SLEEP) 25 MG PO TABS: 25.0000 mg | ORAL_TABLET | Freq: Two times a day (BID) | ORAL | 6 refills | Status: DC

## 2017-03-07 NOTE — Progress Notes (Signed)
Subjective:  KEASHA MALKIEWICZ is a 26 y.o. G5P1031 at 106w3d being seen today for ongoing prenatal care.  She is currently monitored for the following issues for this high-risk pregnancy and has Supervision of high-risk pregnancy of young multigravida; Obesity affecting pregnancy, antepartum; Vaginal bleeding during pregnancy, antepartum; and Hyperemesis affecting pregnancy, antepartum on her problem list.  Patient reports fatigue, nausea and vomiting.  Contractions: Not present. Vag. Bleeding: None.  Movement: Absent. Denies leaking of fluid.   The following portions of the patient's history were reviewed and updated as appropriate: allergies, current medications, past family history, past medical history, past social history, past surgical history and problem list. Problem list updated.  Objective:   Vitals:   03/07/17 0958  BP: 113/77  Pulse: 84  Weight: 191 lb 9.6 oz (86.9 kg)    Fetal Status: Fetal Heart Rate (bpm): 145   Movement: Absent     General:  Alert, oriented and cooperative. Patient is in no acute distress.  Skin: Skin is warm and dry. No rash noted.   Cardiovascular: Normal heart rate noted  Respiratory: Normal respiratory effort, no problems with respiration noted  Abdomen: Soft, gravid, appropriate for gestational age. Pain/Pressure: Present     Pelvic:  Cervical exam deferred        Extremities: Normal range of motion.  Edema: None  Mental Status: Normal mood and affect. Normal behavior. Normal judgment and thought content.   Urinalysis:      Assessment and Plan:  Pregnancy: G5P1031 at [redacted]w[redacted]d  1. Supervision of high-risk pregnancy of young multigravida  2. Nausea and vomiting during pregnancy prior to [redacted] weeks gestation Rx: - pyridOXINE (VITAMIN B-6) 50 MG tablet; Take 1 tablet (50 mg total) by mouth 2 (two) times daily at 8 am and 10 pm.  Dispense: 60 tablet; Refill: 6 - doxylamine, Sleep, (UNISOM) 25 MG tablet; Take 1 tablet (25 mg total) by mouth 2 (two) times  daily at 8 am and 10 pm.  Dispense: 60 tablet; Refill: 6  3. Vaginal bleeding during pregnancy, antepartum - stable  4. Subchorionic hemorrhage of placenta in first trimester, single or unspecified fetus - stable    Preterm labor symptoms and general obstetric precautions including but not limited to vaginal bleeding, contractions, leaking of fluid and fetal movement were reviewed in detail with the patient. Please refer to After Visit Summary for other counseling recommendations.  Return in about 4 weeks (around 04/04/2017) for [facility].   Brock Bad, MD

## 2017-03-07 NOTE — Progress Notes (Signed)
Pt c/o nausea/vomiting, LLQ pain, lumbago

## 2017-03-14 ENCOUNTER — Other Ambulatory Visit: Payer: Self-pay | Admitting: Student

## 2017-03-16 ENCOUNTER — Encounter: Payer: 59 | Admitting: Obstetrics and Gynecology

## 2017-04-04 ENCOUNTER — Ambulatory Visit (INDEPENDENT_AMBULATORY_CARE_PROVIDER_SITE_OTHER): Payer: 59 | Admitting: Obstetrics

## 2017-04-04 ENCOUNTER — Encounter: Payer: Self-pay | Admitting: Obstetrics

## 2017-04-04 VITALS — BP 118/82 | HR 81 | Wt 193.4 lb

## 2017-04-04 DIAGNOSIS — O09622 Supervision of young multigravida, second trimester: Secondary | ICD-10-CM

## 2017-04-04 DIAGNOSIS — Z23 Encounter for immunization: Secondary | ICD-10-CM

## 2017-04-04 DIAGNOSIS — F419 Anxiety disorder, unspecified: Secondary | ICD-10-CM

## 2017-04-04 DIAGNOSIS — O09629 Supervision of young multigravida, unspecified trimester: Secondary | ICD-10-CM

## 2017-04-04 DIAGNOSIS — O210 Mild hyperemesis gravidarum: Secondary | ICD-10-CM

## 2017-04-04 DIAGNOSIS — F32A Depression, unspecified: Secondary | ICD-10-CM

## 2017-04-04 DIAGNOSIS — F329 Major depressive disorder, single episode, unspecified: Secondary | ICD-10-CM

## 2017-04-04 DIAGNOSIS — O99212 Obesity complicating pregnancy, second trimester: Secondary | ICD-10-CM

## 2017-04-04 DIAGNOSIS — O99210 Obesity complicating pregnancy, unspecified trimester: Secondary | ICD-10-CM

## 2017-04-04 NOTE — Progress Notes (Signed)
Subjective:  Carla [name] is a 26 y.o. G5P1031 at 6851w3d being seen today for ongoing prenatal care.  She is currently monitored for the following issues for this high-risk pregnancy and has Supervision of high-risk pregnancy of young multigravida; Obesity affecting pregnancy, antepartum; Vaginal bleeding during pregnancy, antepartum; and Hyperemesis affecting pregnancy, antepartum on their problem list.  Patient reports nausea, vomiting and depression.  Contractions: Irritability. Vag. Bleeding: None.  Movement: Present. Denies leaking of fluid.   The following portions of the patient's history were reviewed and updated as appropriate: allergies, current medications, past family history, past medical history, past social history, past surgical history and problem list. Problem list updated.  Objective:   Vitals:   04/04/17 0954  BP: 118/82  Pulse: 81  Weight: 193 lb 6.4 oz (87.7 kg)    Fetal Status: Fetal Heart Rate (bpm): 140   Movement: Present     General:  Alert, oriented and cooperative. Patient is in no acute distress.  Skin: Skin is warm and dry. No rash noted.   Cardiovascular: Normal heart rate noted  Respiratory: Normal respiratory effort, no problems with respiration noted  Abdomen: Soft, gravid, appropriate for gestational age. Pain/Pressure: Present     Pelvic:  Cervical exam deferred        Extremities: Normal range of motion.  Edema: None  Mental Status: Normal mood and affect. Normal behavior. Normal judgment and thought content.   Urinalysis:      Assessment and Plan:  Pregnancy: [id number] at 5251w3d Rx: 1. Supervision of high-risk pregnancy of young multigravida Rx: - US MFM OB COMP + 14 WK; Future  2. Anxiety and depression Rx: - Ambulatory referral to [facility]  3. Hyperemesis affecting pregnancy, antepartum - stable.  Continue present management.  4. Obesity affecting pregnancy, antepartum  5. Encounter for immunization Rx: - Flu  Vaccine QUAD 36+ mos IM (Fluarix, Quad PF)  Preterm labor symptoms and general obstetric precautions including but not limited to vaginal bleeding, contractions, leaking of fluid and fetal movement were reviewed in detail with the patient. Please refer to After Visit Summary for other counseling recommendations.  Return in about 2 weeks (around 04/18/2017) for [facility].   Brock BadHarper, Tyniesha Howald A, MD

## 2017-04-05 ENCOUNTER — Other Ambulatory Visit: Payer: Self-pay

## 2017-04-05 DIAGNOSIS — O210 Mild hyperemesis gravidarum: Secondary | ICD-10-CM

## 2017-04-05 NOTE — Progress Notes (Signed)
OK for metoclopramide.

## 2017-04-11 ENCOUNTER — Telehealth: Payer: Self-pay

## 2017-04-11 NOTE — Telephone Encounter (Signed)
Rx refill Request for Metoclopramide 10mg  tablet.

## 2017-04-17 ENCOUNTER — Ambulatory Visit (HOSPITAL_COMMUNITY)
Admission: RE | Admit: 2017-04-17 | Discharge: 2017-04-17 | Disposition: A | Discharge status: Home / Self Care | Payer: Medicaid Other | Source: Ambulatory Visit | Attending: Obstetrics | Admitting: Obstetrics

## 2017-04-17 ENCOUNTER — Other Ambulatory Visit: Payer: Self-pay | Admitting: Obstetrics

## 2017-04-17 DIAGNOSIS — E669 Obesity, unspecified: Secondary | ICD-10-CM | POA: Diagnosis not present

## 2017-04-17 DIAGNOSIS — O99212 Obesity complicating pregnancy, second trimester: Secondary | ICD-10-CM | POA: Diagnosis not present

## 2017-04-17 DIAGNOSIS — Z3A19 19 weeks gestation of pregnancy: Secondary | ICD-10-CM

## 2017-04-17 DIAGNOSIS — O321XX0 Maternal care for breech presentation, not applicable or unspecified: Secondary | ICD-10-CM | POA: Insufficient documentation

## 2017-04-17 DIAGNOSIS — O09629 Supervision of young multigravida, unspecified trimester: Secondary | ICD-10-CM

## 2017-04-17 DIAGNOSIS — Z3689 Encounter for other specified antenatal screening: Secondary | ICD-10-CM

## 2017-04-17 DIAGNOSIS — Z6833 Body mass index (BMI) 33.0-33.9, adult: Secondary | ICD-10-CM | POA: Diagnosis not present

## 2017-04-22 ENCOUNTER — Other Ambulatory Visit: Payer: Self-pay | Admitting: Student

## 2017-04-24 ENCOUNTER — Inpatient Hospital Stay (HOSPITAL_COMMUNITY)
Admission: AD | Admit: 2017-04-24 | Discharge: 2017-04-25 | Disposition: A | Discharge status: Home / Self Care | Payer: Medicaid Other | Source: Ambulatory Visit | Attending: Obstetrics & Gynecology | Admitting: Obstetrics & Gynecology

## 2017-04-24 DIAGNOSIS — O99342 Other mental disorders complicating pregnancy, second trimester: Secondary | ICD-10-CM | POA: Insufficient documentation

## 2017-04-24 DIAGNOSIS — F329 Major depressive disorder, single episode, unspecified: Secondary | ICD-10-CM | POA: Insufficient documentation

## 2017-04-24 DIAGNOSIS — M545 Low back pain, unspecified: Secondary | ICD-10-CM

## 2017-04-24 DIAGNOSIS — O09629 Supervision of young multigravida, unspecified trimester: Secondary | ICD-10-CM

## 2017-04-24 DIAGNOSIS — O26899 Other specified pregnancy related conditions, unspecified trimester: Secondary | ICD-10-CM

## 2017-04-24 DIAGNOSIS — O26892 Other specified pregnancy related conditions, second trimester: Secondary | ICD-10-CM | POA: Insufficient documentation

## 2017-04-24 DIAGNOSIS — E669 Obesity, unspecified: Secondary | ICD-10-CM | POA: Insufficient documentation

## 2017-04-24 DIAGNOSIS — O99212 Obesity complicating pregnancy, second trimester: Secondary | ICD-10-CM | POA: Insufficient documentation

## 2017-04-24 DIAGNOSIS — O99210 Obesity complicating pregnancy, unspecified trimester: Secondary | ICD-10-CM

## 2017-04-24 DIAGNOSIS — Z3A20 20 weeks gestation of pregnancy: Secondary | ICD-10-CM | POA: Insufficient documentation

## 2017-04-24 DIAGNOSIS — R102 Pelvic and perineal pain: Secondary | ICD-10-CM | POA: Insufficient documentation

## 2017-04-24 DIAGNOSIS — O99012 Anemia complicating pregnancy, second trimester: Secondary | ICD-10-CM | POA: Insufficient documentation

## 2017-04-24 DIAGNOSIS — O09892 Supervision of other high risk pregnancies, second trimester: Secondary | ICD-10-CM | POA: Insufficient documentation

## 2017-04-25 ENCOUNTER — Encounter (HOSPITAL_COMMUNITY): Payer: Self-pay

## 2017-04-25 DIAGNOSIS — O99212 Obesity complicating pregnancy, second trimester: Secondary | ICD-10-CM | POA: Diagnosis not present

## 2017-04-25 DIAGNOSIS — E669 Obesity, unspecified: Secondary | ICD-10-CM | POA: Diagnosis not present

## 2017-04-25 DIAGNOSIS — Z3A20 20 weeks gestation of pregnancy: Secondary | ICD-10-CM | POA: Diagnosis not present

## 2017-04-25 DIAGNOSIS — R102 Pelvic and perineal pain: Secondary | ICD-10-CM

## 2017-04-25 DIAGNOSIS — O26892 Other specified pregnancy related conditions, second trimester: Secondary | ICD-10-CM | POA: Diagnosis not present

## 2017-04-25 DIAGNOSIS — F329 Major depressive disorder, single episode, unspecified: Secondary | ICD-10-CM | POA: Diagnosis not present

## 2017-04-25 DIAGNOSIS — O99342 Other mental disorders complicating pregnancy, second trimester: Secondary | ICD-10-CM | POA: Diagnosis not present

## 2017-04-25 DIAGNOSIS — O09892 Supervision of other high risk pregnancies, second trimester: Secondary | ICD-10-CM | POA: Diagnosis not present

## 2017-04-25 DIAGNOSIS — M545 Low back pain: Secondary | ICD-10-CM | POA: Diagnosis present

## 2017-04-25 DIAGNOSIS — O99012 Anemia complicating pregnancy, second trimester: Secondary | ICD-10-CM | POA: Diagnosis not present

## 2017-04-25 LAB — URINALYSIS, ROUTINE W REFLEX MICROSCOPIC
BACTERIA UA: NONE SEEN
BILIRUBIN URINE: NEGATIVE
Glucose, UA: NEGATIVE mg/dL
Ketones, ur: 5 mg/dL — AB
Leukocytes, UA: NEGATIVE
NITRITE: NEGATIVE
PROTEIN: 30 mg/dL — AB
Specific Gravity, Urine: 1.029 (ref 1.005–1.030)
pH: 5 (ref 5.0–8.0)

## 2017-04-25 MED ORDER — CYCLOBENZAPRINE HCL 5 MG PO TABS
5.0000 mg | ORAL_TABLET | Freq: Three times a day (TID) | ORAL | 0 refills | Status: DC | PRN
Start: 2017-04-25 — End: 2017-06-08

## 2017-04-25 NOTE — MAU Note (Signed)
Pt here with c/o vaginal pressure starting tonight, back and abdominal pain for the past 3 days. Hx of PTL with last delivery. Reports fetal movement. Denies any bleeding or leaking of fluid.

## 2017-04-25 NOTE — Discharge Instructions (Signed)
Discuss with your provider progesterone supplementation with history of preterm labor Muscle relaxant given to help with back pain

## 2017-04-25 NOTE — MAU Provider Note (Signed)
Chief Complaint: Abdominal Pain and Back Pain   SUBJECTIVE HPI: Carla [name] is a 26 y.o. G5P1031 at [redacted]w[redacted]d who presents to MAU with complaints of vaginal pressure starting tonight.  Patient also endorsing some associated back and abdominal pain for the last several days.  Patient concerned that she may be going into labor.  She reports positive fetal movement.  Continues to have hyperemesis gravidarum.  Denies any vaginal bleeding or leaking of fluid.  Patient endorses that she did a digital vaginal exam on herself and felt something unfamiliar.  Then had has been doing exam.  They were concerned so came in tonight.  She states she has a history of preterm labor(no consistent with chart).  Received prenatal care at the [facility] office.   Past Medical History:  Diagnosis Date  . Allergy   . Anemia   . Depression   . Headache(784.0)   . PONV (postoperative nausea and vomiting)   . Spontaneous abortion 12/03/2010   OB History  Gravida Para Term Preterm AB Living  5 1 1   3 1   SAB TAB Ectopic Multiple Live Births  3       1    # Outcome Date GA Lbr Len/2nd Weight Sex Delivery Anes PTL Lv  5 Current           4 Term 05/25/13 [redacted]w[redacted]d 11:01 / 01:14 3.085 kg (6 lb 12.8 oz) F Vag-Spont EPI  LIV  3 SAB           2 SAB           1 SAB             Obstetric Comments  Patient has had multiple miscarriages- she has had 2 recent SAB. The most recent is reported 1 1/2 months ago.  Pt states she may have had up to 8 pregnancies, has had multiple SAB- lost count.   Past Surgical History:  Procedure Laterality Date  . CHOLECYSTECTOMY N/A 05/13/2015   Procedure: LAPAROSCOPIC CHOLECYSTECTOMY ;  Surgeon: Manus Rudd, MD;  Location: [facility] OR;  Service: General;  Laterality: N/A;  . DILATION AND EVACUATION  12/03/2010   Procedure: DILATATION AND EVACUATION (D&E);  Surgeon: Roseanna Rainbow, MD;  Location: [facility] ORS;  Service: Gynecology;  Laterality: N/A;  . OVARIAN CYST REMOVAL    . TONSILLECTOMY      Social History   Socioeconomic History  . Marital status: Married    Spouse name: Not on file  . Number of children: Not on file  . Years of education: Not on file  . Highest education level: Not on file  Social Needs  . Financial resource strain: Not on file  . Food insecurity - worry: Not on file  . Food insecurity - inability: Not on file  . Transportation needs - medical: Not on file  . Transportation needs - non-medical: Not on file  Occupational History  . Not on file  Tobacco Use  . Smoking status: Never Smoker  . Smokeless tobacco: Never Used  Substance and Sexual Activity  . Alcohol use: No    Alcohol/week: 0.0 oz    Comment: Ocassionally  . Drug use: No  . Sexual activity: Yes    Partners: Male  Other Topics Concern  . Not on file  Social History Narrative  . Not on file   No current facility-administered medications on file prior to encounter.    Current Outpatient Medications on File Prior to Encounter  Medication Sig Dispense  Refill  . acetaminophen (TYLENOL) 500 MG tablet Take 1,000 mg by mouth every 6 (six) hours as needed for moderate pain or fever. Reported on 11/26/2015    . doxylamine, Sleep, (UNISOM) 25 MG tablet Take 1 tablet (25 mg total) by mouth 2 (two) times daily at 8 am and 10 pm. 60 tablet 6  . folic acid (FOLVITE) 1 MG tablet Take 2 tablets (2 mg total) by mouth daily. 60 tablet 11  . metoCLOPramide (REGLAN) 10 MG tablet TAKE 1 TABLET BY MOUTH EVERY 8 HOURS AS NEEDED FOR NAUSEA AND VOMITING 30 tablet 5  . ondansetron (ZOFRAN ODT) 4 MG disintegrating tablet Take 1 tablet (4 mg total) by mouth every 6 (six) hours as needed for nausea. 20 tablet 2  . pantoprazole (PROTONIX) 40 MG tablet Take 1 tablet (40 mg total) by mouth daily. 30 tablet 3  . pyridOXINE (VITAMIN B-6) 50 MG tablet Take 1 tablet (50 mg total) by mouth 2 (two) times daily at 8 am and 10 pm. 60 tablet 6  . ranitidine (ZANTAC) 150 MG tablet TAKE 1 TABLET BY MOUTH TWICE DAILY 60  tablet 0  . ranitidine (ZANTAC) 150 MG tablet TK 1 T PO BID  0   Allergies  Allergen Reactions  . Shellfish Allergy Anaphylaxis  . Latex Itching    I have reviewed the past Medical Hx, Surgical Hx, Social Hx, Allergies and Medications.   REVIEW OF SYSTEMS All systems reviewed and are negative for acute change except as noted in the HPI.   OBJECTIVE BP 133/76 (BP Location: Right Arm)   Pulse 86   Temp 97.8 F (36.6 C) (Oral)   Resp 18   Ht 5\' 4"  (1.626 m)   Wt 88.9 kg (196 lb)   LMP 11/13/2016 (Approximate)   SpO2 99%   BMI 33.64 kg/m    PHYSICAL EXAM Constitutional: Well-developed, well-nourished female in no acute distress.  Head: NCAT Cardiovascular: normal rate and rhythm, pulses intact Respiratory: normal rate and effort.  GI: Abd soft, non-tender, non-distended. MS: Extremities nontender, no edema, normal ROM Neurologic: Alert and oriented x 4. No focal deficits. Normal gait. GU: Neg CVAT. SPECULUM EXAM: NEFG, physiologic discharge, no blood noted, cervix clean BIMANUAL: uterus appropriate size, no adnexal tenderness or masses. No CMT. No abnormalities palpated Psych: normal mood and affect  Dilation: Fingertip Effacement (%): Thick Exam by:: Dr Doroteo GlassmanPhelps   LAB RESULTS Results for orders placed or performed during the hospital encounter of 04/24/17 (from the past 24 hour(s))  Urinalysis, Routine w reflex microscopic     Status: Abnormal   Collection Time: 04/25/17 12:08 AM  Result Value Ref Range   Color, Urine YELLOW YELLOW   APPearance CLEAR CLEAR   Specific Gravity, Urine 1.029 1.005 - 1.030   pH 5.0 5.0 - 8.0   Glucose, UA NEGATIVE NEGATIVE mg/dL   Hgb urine dipstick SMALL (A) NEGATIVE   Bilirubin Urine NEGATIVE NEGATIVE   Ketones, ur 5 (A) NEGATIVE mg/dL   Protein, ur 30 (A) NEGATIVE mg/dL   Nitrite NEGATIVE NEGATIVE   Leukocytes, UA NEGATIVE NEGATIVE   RBC / HPF 0-5 0 - 5 RBC/hpf   WBC, UA 0-5 0 - 5 WBC/hpf   Bacteria, UA NONE SEEN NONE SEEN    Squamous Epithelial / LPF 0-5 (A) NONE SEEN   Mucus PRESENT     IMAGING [place] Mfm Ob Detail +14 Wk  Result Date: 04/18/2017 ----------------------------------------------------------------------  OBSTETRICS REPORT                      (  Signed Final 04/18/2017 12:54 am) ---------------------------------------------------------------------- Patient Info  ID #:       [id number]                          D.O.B.:  03-29-1991 (26 yrs)  Name:       Carla [name]                 Visit Date: 04/17/2017 09:28 am ---------------------------------------------------------------------- Performed By  Performed By:     Tomma Lightning             Ref. Address:      [address]                    [address]                                                              [address]                                                              [place], [place]                                                              [address]  Attending:        Particia Nearing MD       Location:          [facility]  Referred By:      Brock Bad MD ---------------------------------------------------------------------- Orders   #  Description                                 Code   1  [place] MFM OB DETAIL +14 WK                     95284.13  ----------------------------------------------------------------------   #  Ordered By               Order #        Accession #    Episode #   1  Coral Ceo           [id number]      [id number]     [id number]  ---------------------------------------------------------------------- Indications   [redacted] weeks gestation of pregnancy                Z3A.19   Obesity complicating pregnancy, second         O99.212   trimester (pre preg BMI 34.8)   Encounter for fetal anatomic survey            Z36.89  ---------------------------------------------------------------------- OB History  Blood Type:  Height:  5'4"   Weight (lb):  193       BMI:  33.12  Gravidity:    5         Term:   1         SAB:    3  Living:       1 ---------------------------------------------------------------------- Fetal Evaluation  Num Of Fetuses:     1  Fetal Heart         142  Rate(bpm):  Cardiac Activity:   Observed  Presentation:       Breech  Placenta:           Anterior, above cervical os  P. Cord Insertion:  Visualized  Amniotic Fluid  AFI FV:      Subjectively within normal limits                              Largest Pocket(cm)                              4.78 ---------------------------------------------------------------------- Biometry  BPD:      43.2  mm     G. Age:  19w 0d         41  %    CI:        70.01   %    70 - 86                                                          FL/HC:       17.9  %    16.1 - 18.3  HC:      164.7  mm     G. Age:  19w 1d         39  %    HC/AC:       1.09       1.09 - 1.39  AC:      151.6  mm     G. Age:  20w 3d         79  %    FL/BPD:      68.1  %  FL:       29.4  mm     G. Age:  19w 0d         35  %    FL/AC:       19.4  %    20 - 24  HUM:      32.1  mm     G. Age:  20w 5d         90  %  CER:      19.4  mm     G. Age:  18w 5d         35  %  NFT:       5.1  mm  CM:        4.1  mm  Est. FW:     307   gm    0 lb 11 oz     53  % ---------------------------------------------------------------------- Gestational Age  LMP:           22w 1d        Date:  11/13/16  EDD:   08/20/17  U/S Today:     19w 3d                                        EDD:   09/08/17  Best:          19w 2d     Det. ByMarcella Dubs:  Early Ultrasound         EDD:   09/09/17                                      (01/13/17) ---------------------------------------------------------------------- Anatomy  Cranium:               Appears normal         Aortic Arch:            Not well visualized  Cavum:                 Appears normal         Ductal Arch:            Not well visualized  Ventricles:            Appears normal         Diaphragm:              Not well visualized  Choroid Plexus:        Appears normal         Stomach:                 Appears normal, left                                                                        sided  Cerebellum:            Appears normal         Abdomen:                Appears normal  Posterior Fossa:       Appears normal         Abdominal Wall:         Appears nml (cord                                                                        insert, abd wall)  Nuchal Fold:           Appears normal         Cord Vessels:           Appears normal (3  vessel cord)  Face:                  Appears normal         Kidneys:                Appear normal                         (orbits and profile)  Lips:                  Appears normal         Bladder:                Appears normal  Thoracic:              Appears normal         Spine:                  Not well visualized  Heart:                 Not well visualized    Upper Extremities:      Appears normal  RVOT:                  Not well visualized    Lower Extremities:      Appears normal  LVOT:                  Not well visualized  Other:  Fetus appears to be a female. Heels visualized. Nasal bone          visualized. Technically difficult due to maternal habitus and fetal          position. ---------------------------------------------------------------------- Cervix Uterus Adnexa  Cervix  Length:           3.56  cm.  Normal appearance by transabdominal scan.  Left Ovary  Size(cm)       3.2  x   1.6    x  2.2       Vol(ml):  5.9  Within normal limits.  Right Ovary  Size(cm)       2.6  x   1.7    x  1.9       Vol(ml):  4.4  Within normal limits. ---------------------------------------------------------------------- Impression  SIUP at 19+2 weeks  Normal detailed fetal anatomy; limited views of heart and  spine  Markers of aneuploidy: none  Normal amniotic fluid volume  Measurements consistent with early [place] ---------------------------------------------------------------------- Recommendations   Follow-up ultrasound in 4-6 weeks to complete anatomy  survey ----------------------------------------------------------------------                 Particia Nearing, MD Electronically Signed Final Report   04/18/2017 12:54 am ----------------------------------------------------------------------   MAU COURSE Vitals and nursing notes reviewed UA unremarkable No signs of PTL Doppler FHT 140bpm  MDM Plan of care reviewed with patient, including labs and tests ordered and medical treatment.   ASSESSMENT 1. Pelvic pressure in pregnancy   2. Supervision of high-risk pregnancy of young multigravida   3. Obesity affecting pregnancy, antepartum   4. Acute midline low back pain without sciatica     PLAN Discharge home in stable condition Reassurance given Rx for flexeril for back pain; tylenol prn Continue antiemetics  Follow-up with OB provider Counseled on return precautions Handout given   Caryl Ada, DO OB Fellow Faculty [facility], [facility] 04/25/2017, 12:38 AM

## 2017-05-02 ENCOUNTER — Encounter: Payer: 59 | Admitting: Obstetrics

## 2017-05-04 ENCOUNTER — Encounter: Payer: Self-pay | Admitting: Obstetrics

## 2017-05-04 ENCOUNTER — Ambulatory Visit (INDEPENDENT_AMBULATORY_CARE_PROVIDER_SITE_OTHER): Payer: 59 | Admitting: Obstetrics

## 2017-05-04 VITALS — BP 132/79 | HR 87 | Wt 196.3 lb

## 2017-05-04 DIAGNOSIS — O09212 Supervision of pregnancy with history of pre-term labor, second trimester: Secondary | ICD-10-CM

## 2017-05-04 DIAGNOSIS — F32A Depression, unspecified: Secondary | ICD-10-CM

## 2017-05-04 DIAGNOSIS — O09629 Supervision of young multigravida, unspecified trimester: Secondary | ICD-10-CM

## 2017-05-04 DIAGNOSIS — Z3689 Encounter for other specified antenatal screening: Secondary | ICD-10-CM

## 2017-05-04 DIAGNOSIS — F419 Anxiety disorder, unspecified: Secondary | ICD-10-CM

## 2017-05-04 DIAGNOSIS — O99212 Obesity complicating pregnancy, second trimester: Secondary | ICD-10-CM

## 2017-05-04 DIAGNOSIS — O99210 Obesity complicating pregnancy, unspecified trimester: Secondary | ICD-10-CM

## 2017-05-04 DIAGNOSIS — O09622 Supervision of young multigravida, second trimester: Secondary | ICD-10-CM

## 2017-05-04 DIAGNOSIS — G8929 Other chronic pain: Secondary | ICD-10-CM

## 2017-05-04 DIAGNOSIS — M545 Low back pain, unspecified: Secondary | ICD-10-CM

## 2017-05-04 DIAGNOSIS — F329 Major depressive disorder, single episode, unspecified: Secondary | ICD-10-CM

## 2017-05-04 MED ORDER — COMFORT FIT MATERNITY SUPP SM MISC: 0 refills | Status: DC

## 2017-05-04 MED ORDER — HYDROXYPROGESTERONE CAPROATE 275 MG/1.1ML ~~LOC~~ SOAJ
275.0000 mg | Freq: Once | SUBCUTANEOUS | Status: AC
  Administered 2017-05-04: 275 mg via SUBCUTANEOUS

## 2017-05-04 NOTE — Progress Notes (Signed)
Pt c/o intermittent vaginal bleeding, vaginal pressure, and pain.  She reports recent visit to MAU, was advised to start 17P.  Pt reports she had referral to [facility] from last appt, has not rec'd a call back in regards to appt. Office supply 17p given

## 2017-05-04 NOTE — Progress Notes (Signed)
Subjective:  Carla [name] is a 26 y.o. G5P1031 at 3665w5d being seen today for ongoing prenatal care.  She is currently monitored for the following issues for this high risk pregnancy and has Supervision of high-risk pregnancy of young multigravida; Obesity affecting pregnancy, antepartum; Vaginal bleeding during pregnancy, antepartum; and Hyperemesis affecting pregnancy, antepartum on their problem list.  Patient reports backache.  Contractions: Irritability. Vag. Bleeding: Bloody Show.  Movement: Present. Denies leaking of fluid.   The following portions of the patient's history were reviewed and updated as appropriate: allergies, current medications, past family history, past medical history, past social history, past surgical history and problem list. Problem list updated.  Objective:   Vitals:   05/04/17 1342  BP: 132/79  Pulse: 87  Weight: 196 lb 4.8 oz (89 kg)    Fetal Status: Fetal Heart Rate (bpm): 140   Movement: Present     General:  Alert, oriented and cooperative. Patient is in no acute distress.  Skin: Skin is warm and dry. No rash noted.   Cardiovascular: Normal heart rate noted  Respiratory: Normal respiratory effort, no problems with respiration noted  Abdomen: Soft, gravid, appropriate for gestational age. Pain/Pressure: Present     Pelvic:  Cervical exam deferred        Extremities: Normal range of motion.  Edema: None  Mental Status: Normal mood and affect. Normal behavior. Normal judgment and thought content.   Urinalysis:      Assessment and Plan:  Pregnancy: G5P1031 at 8065w5d  1. Supervision of high-risk pregnancy of young multigravida  2. Obesity affecting pregnancy, antepartum  3. Chronic midline low back pain without sciatica Rx: - Elastic Bandages & Supports (COMFORT FIT MATERNITY SUPP SM) MISC; Wear as directed.  Dispense: 1 each; Refill: 0  4. Anxiety and depression Rx: - Ambulatory referral to Integrated [facility]  5. Encounter for  ultrasound to assess interval growth of fetus Rx - US MFM OB FOLLOW UP; Future  Preterm labor symptoms and general obstetric precautions including but not limited to vaginal bleeding, contractions, leaking of fluid and fetal movement were reviewed in detail with the patient. Please refer to After Visit Summary for other counseling recommendations.  Return in about 4 weeks (around 06/01/2017) for ROB.   Brock BadHarper, Jailin Moomaw A, MD

## 2017-05-11 ENCOUNTER — Ambulatory Visit (INDEPENDENT_AMBULATORY_CARE_PROVIDER_SITE_OTHER): Payer: 59 | Admitting: Obstetrics

## 2017-05-11 ENCOUNTER — Encounter: Payer: Self-pay | Admitting: Obstetrics

## 2017-05-11 VITALS — BP 124/71 | HR 93 | Wt 198.0 lb

## 2017-05-11 DIAGNOSIS — O99212 Obesity complicating pregnancy, second trimester: Secondary | ICD-10-CM

## 2017-05-11 DIAGNOSIS — M545 Low back pain: Secondary | ICD-10-CM

## 2017-05-11 DIAGNOSIS — G8929 Other chronic pain: Secondary | ICD-10-CM

## 2017-05-11 DIAGNOSIS — Z3482 Encounter for supervision of other normal pregnancy, second trimester: Secondary | ICD-10-CM

## 2017-05-11 DIAGNOSIS — F419 Anxiety disorder, unspecified: Secondary | ICD-10-CM

## 2017-05-11 DIAGNOSIS — Z3480 Encounter for supervision of other normal pregnancy, unspecified trimester: Secondary | ICD-10-CM

## 2017-05-11 DIAGNOSIS — F329 Major depressive disorder, single episode, unspecified: Secondary | ICD-10-CM

## 2017-05-11 DIAGNOSIS — O99210 Obesity complicating pregnancy, unspecified trimester: Secondary | ICD-10-CM

## 2017-05-11 NOTE — Progress Notes (Signed)
Pt states disability work was faxed to our office last week they have not heard anything back from us.  Pt has ate today per note early 1 GTT, TSH, and HgbA1c. .Marland Kitchen

## 2017-05-11 NOTE — Progress Notes (Signed)
Subjective:  Carla [name] is a 26 y.o. G5P1031 at 3151w5d being seen today for ongoing prenatal care.  She is currently monitored for the following issues for this low-risk pregnancy and has Supervision of high-risk pregnancy of young multigravida; Obesity affecting pregnancy, antepartum; Vaginal bleeding during pregnancy, antepartum; and Hyperemesis affecting pregnancy, antepartum on their problem list.  Patient reports no complaints.  Contractions: Not present. Vag. Bleeding: None.  Movement: Present. Denies leaking of fluid.   The following portions of the patient's history were reviewed and updated as appropriate: allergies, current medications, past family history, past medical history, past social history, past surgical history and problem list. Problem list updated.  Objective:   Vitals:   05/11/17 0921  BP: 124/71  Pulse: 93  Weight: 198 lb (89.8 kg)    Fetal Status: Fetal Heart Rate (bpm): 140   Movement: Present     General:  Alert, oriented and cooperative. Patient is in no acute distress.  Skin: Skin is warm and dry. No rash noted.   Cardiovascular: Normal heart rate noted  Respiratory: Normal respiratory effort, no problems with respiration noted  Abdomen: Soft, gravid, appropriate for gestational age. Pain/Pressure: Present     Pelvic:  Cervical exam deferred        Extremities: Normal range of motion.  Edema: None  Mental Status: Normal mood and affect. Normal behavior. Normal judgment and thought content.   Urinalysis:      Assessment and Plan:  Pregnancy: G5P1031 at 6451w5d  1. Supervision of other normal pregnancy, antepartum   2. Obesity affecting pregnancy, antepartum   3. Chronic midline low back pain without sciatica [facility] Rx  4. Anxiety and depression - stable  Preterm labor symptoms and general obstetric precautions including but not limited to vaginal bleeding, contractions, leaking of fluid and fetal movement were reviewed in detail with  the patient. Please refer to After Visit Summary for other counseling recommendations.  Return in about 4 weeks (around 06/08/2017) for ROB, 2 hour OGTT.   Brock BadHarper, Charles A, MD

## 2017-05-12 ENCOUNTER — Telehealth: Payer: Self-pay | Admitting: Pediatrics

## 2017-05-12 NOTE — Telephone Encounter (Signed)
Pt called office stating the return to work date on accomodation paperwork was 05/11/17, however, you all had discussed something different.  I do not see any indication in your note. Please advise if patient should still be out or return to work.

## 2017-05-18 ENCOUNTER — Other Ambulatory Visit: Payer: Self-pay | Admitting: Obstetrics and Gynecology

## 2017-05-21 ENCOUNTER — Other Ambulatory Visit: Payer: Self-pay | Admitting: Obstetrics and Gynecology

## 2017-05-23 NOTE — L&D Delivery Note (Signed)
Delivery Note At 3:32 AM a viable female was delivered via Vaginal, Spontaneous (Presentation: ;  OA).  APGAR: ,8/9 ; weight pending  .   After 1 minute, the cord was clamped and cut. 40 units of pitocin diluted in 1000cc LR was infused rapidly IV.  The placenta separated spontaneously and delivered via CCT and maternal pushing effort.  It was inspected and appears to be intact with a 3 VCAnesthesia:   Episiotomy: None Lacerations:  1st degree periurethral, 1 stitch Suture Repair: 3.0 vicryl Est. Blood Loss (mL):  200 Mom to postpartum.  Baby to Couplet care / Skin to Skin.  Carla [name] 08/25/2017, 3:35 AM   The above was performed By Dr.McMullen under my direct supervision and guidance.    Please schedule this patient for PP visit in: 4 weeks High risk pregnancy complicated by: HTN Delivery mode:  SVD Anticipated Birth Control:  POPs PP Procedures needed: BP check  Schedule Integrated BH visit: no Provider: Any provider  .

## 2017-05-24 ENCOUNTER — Telehealth: Payer: Self-pay

## 2017-05-24 ENCOUNTER — Other Ambulatory Visit: Payer: Self-pay

## 2017-05-24 ENCOUNTER — Encounter (HOSPITAL_COMMUNITY): Payer: Self-pay

## 2017-05-24 ENCOUNTER — Inpatient Hospital Stay (HOSPITAL_COMMUNITY)
Admission: AD | Admit: 2017-05-24 | Discharge: 2017-05-24 | Disposition: A | Discharge status: Home / Self Care | Payer: Medicaid Other | Source: Ambulatory Visit | Attending: Obstetrics & Gynecology | Admitting: Obstetrics & Gynecology

## 2017-05-24 DIAGNOSIS — O9989 Other specified diseases and conditions complicating pregnancy, childbirth and the puerperium: Secondary | ICD-10-CM

## 2017-05-24 DIAGNOSIS — O99891 Other specified diseases and conditions complicating pregnancy: Secondary | ICD-10-CM

## 2017-05-24 DIAGNOSIS — Z79899 Other long term (current) drug therapy: Secondary | ICD-10-CM | POA: Insufficient documentation

## 2017-05-24 DIAGNOSIS — O99342 Other mental disorders complicating pregnancy, second trimester: Secondary | ICD-10-CM | POA: Insufficient documentation

## 2017-05-24 DIAGNOSIS — M549 Dorsalgia, unspecified: Secondary | ICD-10-CM | POA: Diagnosis present

## 2017-05-24 DIAGNOSIS — F329 Major depressive disorder, single episode, unspecified: Secondary | ICD-10-CM | POA: Insufficient documentation

## 2017-05-24 DIAGNOSIS — O26892 Other specified pregnancy related conditions, second trimester: Secondary | ICD-10-CM | POA: Insufficient documentation

## 2017-05-24 DIAGNOSIS — R109 Unspecified abdominal pain: Secondary | ICD-10-CM | POA: Diagnosis present

## 2017-05-24 DIAGNOSIS — Z3A24 24 weeks gestation of pregnancy: Secondary | ICD-10-CM | POA: Insufficient documentation

## 2017-05-24 LAB — URINALYSIS, ROUTINE W REFLEX MICROSCOPIC
Bilirubin Urine: NEGATIVE
GLUCOSE, UA: NEGATIVE mg/dL
Hgb urine dipstick: NEGATIVE
KETONES UR: NEGATIVE mg/dL
LEUKOCYTES UA: NEGATIVE
NITRITE: NEGATIVE
PH: 5 (ref 5.0–8.0)
Protein, ur: 30 mg/dL — AB
Specific Gravity, Urine: 1.027 (ref 1.005–1.030)

## 2017-05-24 LAB — WET PREP, GENITAL
CLUE CELLS WET PREP: NONE SEEN
Sperm: NONE SEEN
Trich, Wet Prep: NONE SEEN
Yeast Wet Prep HPF POC: NONE SEEN

## 2017-05-24 MED ORDER — IBUPROFEN 600 MG PO TABS: 600.0000 mg | ORAL_TABLET | Freq: Four times a day (QID) | ORAL | 0 refills | Status: AC | PRN

## 2017-05-24 MED ORDER — CYCLOBENZAPRINE HCL 5 MG PO TABS
5.0000 mg | ORAL_TABLET | Freq: Once | ORAL | Status: AC
  Administered 2017-05-24: 5 mg via ORAL
  Filled 2017-05-24: qty 1

## 2017-05-24 NOTE — MAU Provider Note (Signed)
History     CSN: [id number]  Arrival date and time: 05/24/17 1456   First Provider Initiated Contact with Patient 05/24/17 1542     Chief Complaint  Patient presents with  . Abdominal Pain  . Back Pain   HPI Carla [name] is a 27 y.o. [id number] at 5531w4d who presents with back pain and lower abdominal cramping. She states the back pain has been an ongoing issue during this pregnancy but got worse today. She rates the pain a 9/10 and has not tried any medication for the pain. She uses a maternity support belt but states it does not help. She also reports intermittent cramping in her lower abdomen that she rates a 4/10 and has not tried any medication for. She reports good fetal movement. Denies any discharge, vaginal bleeding or leaking of fluid. She reports a hx of preterm labor with her first pregnancy but delivered at term. Was started on 17P in the office but stopped because she was told insurance won't cover it.  OB History    Gravida Para Term Preterm AB Living   7 1 1   5 1    SAB TAB Ectopic Multiple Live Births   3       1      Obstetric Comments   Patient has had multiple miscarriages- she has had 2 recent SAB. The most recent is reported 1 1/2 months ago. Pt states she may have had up to 8 pregnancies, has had multiple SAB- lost count.      Past Medical History:  Diagnosis Date  . Allergy   . Anemia   . Depression   . Headache(784.0)   . PONV (postoperative nausea and vomiting)   . Spontaneous abortion 12/03/2010    Past Surgical History:  Procedure Laterality Date  . CHOLECYSTECTOMY N/A 05/13/2015   Procedure: LAPAROSCOPIC CHOLECYSTECTOMY ;  Surgeon: Manus RuddMatthew Tsuei, MD;  Location: [facility] OR;  Service: General;  Laterality: N/A;  . DILATION AND EVACUATION  12/03/2010   Procedure: DILATATION AND EVACUATION (D&E);  Surgeon: Roseanna RainbowLisa A Jackson-Moore, MD;  Location: [facility];  Service: Gynecology;  Laterality: N/A;  . OVARIAN CYST REMOVAL    . TONSILLECTOMY      Family History   Problem Relation Age of Onset  . Hypertension Other   . Diabetes Other   . Stroke Other   . Cancer Other   . Heart failure Other   . Seizures Other     Social History   Tobacco Use  . Smoking status: Never Smoker  . Smokeless tobacco: Never Used  Substance Use Topics  . Alcohol use: No    Alcohol/week: 0.0 oz    Comment: Ocassionally  . Drug use: No    Allergies:  Allergies  Allergen Reactions  . Shellfish Allergy Anaphylaxis  . Latex Itching    Medications Prior to Admission  Medication Sig Dispense Refill Last Dose  . acetaminophen (TYLENOL) 500 MG tablet Take 1,000 mg by mouth every 6 (six) hours as needed for moderate pain or fever. Reported on 11/26/2015   05/23/2017 at Unknown time  . cyclobenzaprine (FLEXERIL) 5 MG tablet Take 1 tablet (5 mg total) by mouth 3 (three) times daily as needed for muscle spasms (back pain). 30 tablet 0 05/23/2017 at Unknown time  . doxylamine, Sleep, (UNISOM) 25 MG tablet Take 1 tablet (25 mg total) by mouth 2 (two) times daily at 8 am and 10 pm. 60 tablet 6 05/23/2017 at Unknown time  . folic acid (  FOLVITE) 1 MG tablet Take 2 tablets (2 mg total) by mouth daily. 60 tablet 11 05/24/2017 at Unknown time  . metoCLOPramide (REGLAN) 10 MG tablet TAKE 1 TABLET BY MOUTH EVERY 8 HOURS AS NEEDED FOR NAUSEA AND VOMITING 30 tablet 5 05/24/2017 at Unknown time  . ondansetron (ZOFRAN ODT) 4 MG disintegrating tablet Take 1 tablet (4 mg total) by mouth every 6 (six) hours as needed for nausea. 20 tablet 2 05/23/2017 at Unknown time  . pantoprazole (PROTONIX) 40 MG tablet Take 1 tablet (40 mg total) by mouth daily. 30 tablet 3 05/24/2017 at Unknown time  . pyridOXINE (VITAMIN B-6) 50 MG tablet Take 1 tablet (50 mg total) by mouth 2 (two) times daily at 8 am and 10 pm. 60 tablet 6 05/23/2017 at Unknown time  . ranitidine (ZANTAC) 150 MG tablet TK 1 T PO BID  0 05/24/2017 at Unknown time  . Elastic Bandages & Supports (COMFORT FIT MATERNITY SUPP SM) MISC Wear as directed. 1  each 0 Taking    Review of Systems  Constitutional: Negative.  Negative for fatigue and fever.  HENT: Negative.   Respiratory: Negative.  Negative for shortness of breath.   Cardiovascular: Negative.  Negative for chest pain.  Gastrointestinal: Positive for abdominal pain. Negative for constipation, diarrhea, nausea and vomiting.  Genitourinary: Negative.  Negative for dysuria, vaginal bleeding and vaginal discharge.  Musculoskeletal: Positive for back pain.  Neurological: Negative.  Negative for dizziness and headaches.   Physical Exam   Blood pressure 123/69, pulse 89, temperature 97.9 F (36.6 C), temperature source Oral, resp. rate 18, last menstrual period 11/13/2016.  Physical Exam  Nursing note and vitals reviewed. Constitutional: She is oriented to person, place, and time. She appears well-developed and well-nourished. No distress.  HENT:  Head: Normocephalic.  Eyes: Pupils are equal, round, and reactive to light.  Cardiovascular: Normal rate, regular rhythm and normal heart sounds.  Respiratory: Effort normal and breath sounds normal. No respiratory distress.  GI: Soft. Bowel sounds are normal. She exhibits no distension. There is no tenderness.  Neurological: She is alert and oriented to person, place, and time.  Skin: Skin is warm and dry.  Psychiatric: She has a normal mood and affect. Her behavior is normal. Judgment and thought content normal.    Fetal Tracing:  Baseline: 140 Variability: moderate Accels: 10x10 Decels: variable  Toco: none  Dilation: Closed Cervical Position: Posterior Exam by:: Antony Odea, CNM  MAU Course  Procedures Results for orders placed or performed during the hospital encounter of 05/24/17 (from the past 24 hour(s))  Urinalysis, Routine w reflex microscopic     Status: Abnormal   Collection Time: 05/24/17  3:16 PM  Result Value Ref Range   Color, Urine AMBER (A) YELLOW   APPearance HAZY (A) CLEAR   Specific Gravity, Urine  1.027 1.005 - 1.030   pH 5.0 5.0 - 8.0   Glucose, UA NEGATIVE NEGATIVE mg/dL   Hgb urine dipstick NEGATIVE NEGATIVE   Bilirubin Urine NEGATIVE NEGATIVE   Ketones, ur NEGATIVE NEGATIVE mg/dL   Protein, ur 30 (A) NEGATIVE mg/dL   Nitrite NEGATIVE NEGATIVE   Leukocytes, UA NEGATIVE NEGATIVE   RBC / HPF 0-5 0 - 5 RBC/hpf   WBC, UA 0-5 0 - 5 WBC/hpf   Bacteria, UA RARE (A) NONE SEEN   Squamous Epithelial / LPF 6-30 (A) NONE SEEN   Mucus PRESENT   Wet prep, genital     Status: Abnormal   Collection Time: 05/24/17  3:56  PM  Result Value Ref Range   Yeast Wet Prep HPF POC NONE SEEN NONE SEEN   Trich, Wet Prep NONE SEEN NONE SEEN   Clue Cells Wet Prep HPF POC NONE SEEN NONE SEEN   WBC, Wet Prep HPF POC FEW (A) NONE SEEN   Sperm NONE SEEN     MDM UA Wet prep and gc/chlamydia Flexiril 5mg  PO NST reassuring for gestational age Patient reports some relief with flexeril, no concern for any acute processes due to length of time pain has occurred and no signs of preterm labor Assessment and Plan   1. Back pain affecting pregnancy in second trimester   2. [redacted] weeks gestation of pregnancy    -Discharge home in stable condition -Rx for ibuprofen 600mg  for 3 days -Preterm labor precautions discussed -Patient advised to follow-up with [facility] as scheduled for prenatal care -Patient may return to MAU as needed or if her condition were to change or worsen   Rolm Bookbinder CNM 05/24/2017, 5:04 PM

## 2017-05-24 NOTE — MAU Note (Signed)
Pt presents to MAU with c/o of back pain and abdominal pain that has been ongoing but is worse today. Pt denies vaginal bleeding and discharge. +FM

## 2017-05-24 NOTE — Discharge Instructions (Signed)
Back Exercises If you have pain in your back, do these exercises 2-3 times each day or as told by your doctor. When the pain goes away, do the exercises once each day, but repeat the steps more times for each exercise (do more repetitions). If you do not have pain in your back, do these exercises once each day or as told by your doctor. Exercises Single Knee to Chest  Do these steps 3-5 times in a row for each leg: 1. Lie on your back on a firm bed or the floor with your legs stretched out. 2. Bring one knee to your chest. 3. Hold your knee to your chest by grabbing your knee or thigh. 4. Pull on your knee until you feel a gentle stretch in your lower back. 5. Keep doing the stretch for 10-30 seconds. 6. Slowly let go of your leg and straighten it.  Pelvic Tilt  Do these steps 5-10 times in a row: 1. Lie on your back on a firm bed or the floor with your legs stretched out. 2. Bend your knees so they point up to the ceiling. Your feet should be flat on the floor. 3. Tighten your lower belly (abdomen) muscles to press your lower back against the floor. This will make your tailbone point up to the ceiling instead of pointing down to your feet or the floor. 4. Stay in this position for 5-10 seconds while you gently tighten your muscles and breathe evenly.  Cat-Cow  Do these steps until your lower back bends more easily: 1. Get on your hands and knees on a firm surface. Keep your hands under your shoulders, and keep your knees under your hips. You may put padding under your knees. 2. Let your head hang down, and make your tailbone point down to the floor so your lower back is round like the back of a cat. 3. Stay in this position for 5 seconds. 4. Slowly lift your head and make your tailbone point up to the ceiling so your back hangs low (sags) like the back of a cow. 5. Stay in this position for 5 seconds.  Press-Ups  Do these steps 5-10 times in a row: 1. Lie on your belly (face-down)  on the floor. 2. Place your hands near your head, about shoulder-width apart. 3. While you keep your back relaxed and keep your hips on the floor, slowly straighten your arms to raise the top half of your body and lift your shoulders. Do not use your back muscles. To make yourself more comfortable, you may change where you place your hands. 4. Stay in this position for 5 seconds. 5. Slowly return to lying flat on the floor.  Bridges  Do these steps 10 times in a row: 1. Lie on your back on a firm surface. 2. Bend your knees so they point up to the ceiling. Your feet should be flat on the floor. 3. Tighten your butt muscles and lift your butt off of the floor until your waist is almost as high as your knees. If you do not feel the muscles working in your butt and the back of your thighs, slide your feet 1-2 inches farther away from your butt. 4. Stay in this position for 3-5 seconds. 5. Slowly lower your butt to the floor, and let your butt muscles relax.  If this exercise is too easy, try doing it with your arms crossed over your chest. Belly Crunches  Do these steps 5-10 times in   a row: 1. Lie on your back on a firm bed or the floor with your legs stretched out. 2. Bend your knees so they point up to the ceiling. Your feet should be flat on the floor. 3. Cross your arms over your chest. 4. Tip your chin a little bit toward your chest but do not bend your neck. 5. Tighten your belly muscles and slowly raise your chest just enough to lift your shoulder blades a tiny bit off of the floor. 6. Slowly lower your chest and your head to the floor.  Back Lifts Do these steps 5-10 times in a row: 1. Lie on your belly (face-down) with your arms at your sides, and rest your forehead on the floor. 2. Tighten the muscles in your legs and your butt. 3. Slowly lift your chest off of the floor while you keep your hips on the floor. Keep the back of your head in line with the curve in your back. Look at  the floor while you do this. 4. Stay in this position for 3-5 seconds. 5. Slowly lower your chest and your face to the floor.  Contact a doctor if:  Your back pain gets a lot worse when you do an exercise.  Your back pain does not lessen 2 hours after you exercise. If you have any of these problems, stop doing the exercises. Do not do them again unless your doctor says it is okay. Get help right away if:  You have sudden, very bad back pain. If this happens, stop doing the exercises. Do not do them again unless your doctor says it is okay. This information is not intended to replace advice given to you by your health care provider. Make sure you discuss any questions you have with your health care provider. Document Released: 06/11/2010 Document Revised: 10/15/2015 Document Reviewed: 07/03/2014 Elsevier Interactive Patient Education  2018 [employer]  Back Pain, Adult Back pain is very common. The pain often gets better over time. The cause of back pain is usually not dangerous. Most people can learn to manage their back pain on their own. Follow these instructions at home: Watch your back pain for any changes. The following actions may help to lessen any pain you are feeling:  Stay active. Start with short walks on flat ground if you can. Try to walk farther each day.  Exercise regularly as told by your doctor. Exercise helps your back heal faster. It also helps avoid future injury by keeping your muscles strong and flexible.  Do not sit, drive, or stand in one place for more than 30 minutes.  Do not stay in bed. Resting more than 1-2 days can slow down your recovery.  Be careful when you bend or lift an object. Use good form when lifting: ? Bend at your knees. ? Keep the object close to your body. ? Do not twist.  Sleep on a firm mattress. Lie on your side, and bend your knees. If you lie on your back, put a pillow under your knees.  Take medicines only as told by your  doctor.  Put ice on the injured area. ? Put ice in a plastic bag. ? Place a towel between your skin and the bag. ? Leave the ice on for 20 minutes, 2-3 times a day for the first 2-3 days. After that, you can switch between ice and heat packs.  Avoid feeling anxious or stressed. Find good ways to deal with stress, such as exercise.    Maintain a healthy weight. Extra weight puts stress on your back.  Contact a doctor if:  You have pain that does not go away with rest or medicine.  You have worsening pain that goes down into your legs or buttocks.  You have pain that does not get better in one week.  You have pain at night.  You lose weight.  You have a fever or chills. Get help right away if:  You cannot control when you poop (bowel movement) or pee (urinate).  Your arms or legs feel weak.  Your arms or legs lose feeling (numbness).  You feel sick to your stomach (nauseous) or throw up (vomit).  You have belly (abdominal) pain.  You feel like you may pass out (faint). This information is not intended to replace advice given to you by your health care provider. Make sure you discuss any questions you have with your health care provider. Document Released: 10/26/2007 Document Revised: 10/15/2015 Document Reviewed: 09/10/2013 Elsevier Interactive Patient Education  2018 Elsevier Inc.  

## 2017-05-24 NOTE — Telephone Encounter (Signed)
Returned call and advised pt that paperwork was faxed this morning.

## 2017-05-25 LAB — GC/CHLAMYDIA PROBE AMP (~~LOC~~) NOT AT ARMC
CHLAMYDIA, DNA PROBE: NEGATIVE
Neisseria Gonorrhea: NEGATIVE

## 2017-06-06 ENCOUNTER — Other Ambulatory Visit: Payer: Self-pay | Admitting: Obstetrics

## 2017-06-06 ENCOUNTER — Other Ambulatory Visit: Payer: Self-pay | Admitting: Student

## 2017-06-06 DIAGNOSIS — O09629 Supervision of young multigravida, unspecified trimester: Secondary | ICD-10-CM

## 2017-06-06 NOTE — Telephone Encounter (Signed)
  Rx Auth Request  Received: Today  Risk analystMessage Contents  Interface, Surescripts Out  P [facility] Rx Refill Pool  Caller: Unspecified (Today, 12:39 PM)      Previous Messages      Requested Medications    Name from pharmacy: METOCLOPRAMIDE 10MG  TABLETS       Will file in chart as: metoCLOPramide (REGLAN) 10 MG tablet   The source prescription was discontinued on 02/27/2017 by Barrett, Mark A, CPhT for the following reason: Duplicate.   Sig: TAKE 1 TABLET BY MOUTH EVERY 8 HOURS AS NEEDED FOR NAUSEA   Disp:  30 tablet  Refills:  0   Start: 06/06/2017   Class: Normal   For: Supervision of high-risk pregnancy of young multigravida   Requested on: 06/06/2017   Last ordered: 3 months ago by Adam PhenixJames G Arnold, MD Last refill: 05/09/2017   Rx #: [id number]     [facility] at[id number] - [place], [place] - [address] [facility] [address] [facility] [address]

## 2017-06-08 ENCOUNTER — Ambulatory Visit (INDEPENDENT_AMBULATORY_CARE_PROVIDER_SITE_OTHER): Payer: 59 | Admitting: Obstetrics

## 2017-06-08 ENCOUNTER — Other Ambulatory Visit: Payer: 59

## 2017-06-08 ENCOUNTER — Encounter: Payer: Self-pay | Admitting: Obstetrics

## 2017-06-08 VITALS — BP 117/78 | HR 82 | Wt 199.7 lb

## 2017-06-08 DIAGNOSIS — Z23 Encounter for immunization: Secondary | ICD-10-CM

## 2017-06-08 DIAGNOSIS — O09629 Supervision of young multigravida, unspecified trimester: Secondary | ICD-10-CM

## 2017-06-08 DIAGNOSIS — G8929 Other chronic pain: Secondary | ICD-10-CM

## 2017-06-08 DIAGNOSIS — M545 Low back pain, unspecified: Secondary | ICD-10-CM

## 2017-06-08 DIAGNOSIS — O99210 Obesity complicating pregnancy, unspecified trimester: Secondary | ICD-10-CM

## 2017-06-08 MED ORDER — CYCLOBENZAPRINE HCL 5 MG PO TABS: 5.0000 mg | ORAL_TABLET | Freq: Three times a day (TID) | ORAL | 2 refills | Status: DC | PRN

## 2017-06-08 NOTE — Progress Notes (Signed)
Pt requests rf for Flexeril.

## 2017-06-08 NOTE — Progress Notes (Signed)
Subjective:  Carla IhaCindy L [name] is a 27 y.o. [phone] at 5949w5d being seen today for ongoing prenatal care.  She is currently monitored for the following issues for this low-risk pregnancy and has Supervision of high-risk pregnancy of young multigravida; Obesity affecting pregnancy, antepartum; Vaginal bleeding during pregnancy, antepartum; and Hyperemesis affecting pregnancy, antepartum on their problem list.  Patient reports backache.  Contractions: Not present. Vag. Bleeding: None.  Movement: Present. Denies leaking of fluid.   The following portions of the patient's history were reviewed and updated as appropriate: allergies, current medications, past family history, past medical history, past social history, past surgical history and problem list. Problem list updated.  Objective:   Vitals:   06/08/17 0848  BP: 117/78  Pulse: 82  Weight: 199 lb 11.2 oz (90.6 kg)    Fetal Status:     Movement: Present     General:  Alert, oriented and cooperative. Patient is in no acute distress.  Skin: Skin is warm and dry. No rash noted.   Cardiovascular: Normal heart rate noted  Respiratory: Normal respiratory effort, no problems with respiration noted  Abdomen: Soft, gravid, appropriate for gestational age. Pain/Pressure: Present     Pelvic:  Cervical exam deferred        Extremities: Normal range of motion.  Edema: Trace  Mental Status: Normal mood and affect. Normal behavior. Normal judgment and thought content.   Urinalysis:      Assessment and Plan:  Pregnancy: [id number] at 6749w5d  1. Supervision of high-risk pregnancy of young multigravida Rx: - CBC - Glucose Tolerance, 2 Hours w/1 Hour - HIV antibody - RPR  2. Chronic midline low back pain without sciatica Rx: - cyclobenzaprine (FLEXERIL) 5 MG tablet; Take 1 tablet (5 mg total) by mouth 3 (three) times daily as needed for muscle spasms (back pain).  Dispense: 30 tablet; Refill: 2  3. Obesity affecting pregnancy, antepartum  Preterm  labor symptoms and general obstetric precautions including but not limited to vaginal bleeding, contractions, leaking of fluid and fetal movement were reviewed in detail with the patient. Please refer to After Visit Summary for other counseling recommendations.  Return in about 2 weeks (around 06/22/2017) for ROB.   Brock BadHarper, Charles A, MD

## 2017-06-09 LAB — CBC
Hematocrit: 32.1 % — ABNORMAL LOW (ref 34.0–46.6)
Hemoglobin: 10.7 g/dL — ABNORMAL LOW (ref 11.1–15.9)
MCH: 29.7 pg (ref 26.6–33.0)
MCHC: 33.3 g/dL (ref 31.5–35.7)
MCV: 89 fL (ref 79–97)
PLATELETS: 299 10*3/uL (ref 150–379)
RBC: 3.6 x10E6/uL — ABNORMAL LOW (ref 3.77–5.28)
RDW: 13.7 % (ref 12.3–15.4)
WBC: 11 10*3/uL — ABNORMAL HIGH (ref 3.4–10.8)

## 2017-06-09 LAB — GLUCOSE TOLERANCE, 2 HOURS W/ 1HR
GLUCOSE, 1 HOUR: 125 mg/dL (ref 65–179)
GLUCOSE, FASTING: 72 mg/dL (ref 65–91)
Glucose, 2 hour: 116 mg/dL (ref 65–152)

## 2017-06-09 LAB — HIV ANTIBODY (ROUTINE TESTING W REFLEX): HIV Screen 4th Generation wRfx: NONREACTIVE

## 2017-06-09 LAB — RPR: RPR Ser Ql: NONREACTIVE

## 2017-06-22 ENCOUNTER — Encounter: Payer: Self-pay | Admitting: Obstetrics

## 2017-06-22 ENCOUNTER — Ambulatory Visit (INDEPENDENT_AMBULATORY_CARE_PROVIDER_SITE_OTHER): Payer: 59 | Admitting: Obstetrics

## 2017-06-22 DIAGNOSIS — Z3483 Encounter for supervision of other normal pregnancy, third trimester: Secondary | ICD-10-CM

## 2017-06-22 DIAGNOSIS — O09629 Supervision of young multigravida, unspecified trimester: Secondary | ICD-10-CM

## 2017-06-22 DIAGNOSIS — O210 Mild hyperemesis gravidarum: Secondary | ICD-10-CM

## 2017-06-22 MED ORDER — METOCLOPRAMIDE HCL 10 MG PO TABS: ORAL_TABLET | ORAL | 5 refills | Status: DC

## 2017-06-22 NOTE — Progress Notes (Signed)
Need RF on Reglan  Has U/S MFM OB f/U on 07/05/17

## 2017-06-22 NOTE — Progress Notes (Signed)
Subjective:  Carla [name] is a 27 y.o. [phone] at 4939w5d being seen today for ongoing prenatal care.  She is currently monitored for the following issues for this low-risk pregnancy and has Supervision of high-risk pregnancy of young multigravida; Obesity affecting pregnancy, antepartum; Vaginal bleeding during pregnancy, antepartum; and Hyperemesis affecting pregnancy, antepartum on their problem list.  Patient reports nausea.  Contractions: Not present. Vag. Bleeding: Scant.  Movement: Present. Denies leaking of fluid.   The following portions of the patient's history were reviewed and updated as appropriate: allergies, current medications, past family history, past medical history, past social history, past surgical history and problem list. Problem list updated.  Objective:   Vitals:   06/22/17 1006  BP: 121/82  Pulse: 90  Weight: 194 lb (88 kg)    Fetal Status: Fetal Heart Rate (bpm): 140   Movement: Present     General:  Alert, oriented and cooperative. Patient is in no acute distress.  Skin: Skin is warm and dry. No rash noted.   Cardiovascular: Normal heart rate noted  Respiratory: Normal respiratory effort, no problems with respiration noted  Abdomen: Soft, gravid, appropriate for gestational age. Pain/Pressure: Present     Pelvic:  Cervical exam deferred        Extremities: Normal range of motion.  Edema: None  Mental Status: Normal mood and affect. Normal behavior. Normal judgment and thought content.   Urinalysis:      Assessment and Plan:  Pregnancy: [id number] at 7139w5d  1. Supervision of high-risk pregnancy of young multigravida  2. Hyperemesis affecting pregnancy, antepartum Rx: - metoCLOPramide (REGLAN) 10 MG tablet; TAKE 1 TABLET BY MOUTH EVERY 8 HOURS AS NEEDED FOR NAUSEA AND VOMITING  Dispense: 30 tablet; Refill: 5  Preterm labor symptoms and general obstetric precautions including but not limited to vaginal bleeding, contractions, leaking of fluid and fetal  movement were reviewed in detail with the patient. Please refer to After Visit Summary for other counseling recommendations.  Return in about 2 weeks (around 07/06/2017) for ROB.   Brock BadHarper, Dylon Correa A, MD

## 2017-07-05 ENCOUNTER — Other Ambulatory Visit: Payer: Self-pay | Admitting: Obstetrics

## 2017-07-05 ENCOUNTER — Ambulatory Visit (HOSPITAL_COMMUNITY)
Admission: RE | Admit: 2017-07-05 | Discharge: 2017-07-05 | Disposition: A | Discharge status: Home / Self Care | Payer: 59 | Source: Ambulatory Visit | Attending: Obstetrics | Admitting: Obstetrics

## 2017-07-05 DIAGNOSIS — Z362 Encounter for other antenatal screening follow-up: Secondary | ICD-10-CM | POA: Diagnosis not present

## 2017-07-05 DIAGNOSIS — Z3A30 30 weeks gestation of pregnancy: Secondary | ICD-10-CM

## 2017-07-05 DIAGNOSIS — O99213 Obesity complicating pregnancy, third trimester: Secondary | ICD-10-CM | POA: Diagnosis present

## 2017-07-05 DIAGNOSIS — Z3689 Encounter for other specified antenatal screening: Secondary | ICD-10-CM

## 2017-07-06 ENCOUNTER — Encounter: Payer: Self-pay | Admitting: Obstetrics

## 2017-07-06 ENCOUNTER — Ambulatory Visit (INDEPENDENT_AMBULATORY_CARE_PROVIDER_SITE_OTHER): Payer: 59 | Admitting: Obstetrics

## 2017-07-06 DIAGNOSIS — O09629 Supervision of young multigravida, unspecified trimester: Secondary | ICD-10-CM

## 2017-07-06 DIAGNOSIS — O09623 Supervision of young multigravida, third trimester: Secondary | ICD-10-CM

## 2017-07-06 NOTE — Progress Notes (Signed)
Subjective:  Carla [name] is a 27 y.o. [phone] at 2893w5d being seen today for ongoing prenatal care.  She is currently monitored for the following issues for this low-risk pregnancy and has Supervision of high-risk pregnancy of young multigravida; Obesity affecting pregnancy, antepartum; Vaginal bleeding during pregnancy, antepartum; and Hyperemesis affecting pregnancy, antepartum on their problem list.  Patient reports no complaints.  Contractions: Irregular. Vag. Bleeding: None.  Movement: Present. Denies leaking of fluid.   The following portions of the patient's history were reviewed and updated as appropriate: allergies, current medications, past family history, past medical history, past social history, past surgical history and problem list. Problem list updated.  Objective:   Vitals:   07/06/17 0904  BP: 121/82  Pulse: 83  Weight: 199 lb 3.2 oz (90.4 kg)    Fetal Status: Fetal Heart Rate (bpm): 140   Movement: Present     General:  Alert, oriented and cooperative. Patient is in no acute distress.  Skin: Skin is warm and dry. No rash noted.   Cardiovascular: Normal heart rate noted  Respiratory: Normal respiratory effort, no problems with respiration noted  Abdomen: Soft, gravid, appropriate for gestational age. Pain/Pressure: Present     Pelvic:  Cervical exam deferred        Extremities: Normal range of motion.  Edema: None  Mental Status: Normal mood and affect. Normal behavior. Normal judgment and thought content.   Urinalysis:      Assessment and Plan:  Pregnancy: [id number] at 5293w5d  1. Supervision of high-risk pregnancy of young multigravida   Preterm labor symptoms and general obstetric precautions including but not limited to vaginal bleeding, contractions, leaking of fluid and fetal movement were reviewed in detail with the patient. Please refer to After Visit Summary for other counseling recommendations.  Return in about 2 weeks (around 07/20/2017) for  ROB.   Brock BadHarper, Jorgen Wolfinger A, MD

## 2017-07-20 ENCOUNTER — Ambulatory Visit (INDEPENDENT_AMBULATORY_CARE_PROVIDER_SITE_OTHER): Payer: 59 | Admitting: Obstetrics & Gynecology

## 2017-07-20 ENCOUNTER — Other Ambulatory Visit: Payer: Self-pay | Admitting: Student

## 2017-07-20 VITALS — BP 118/79 | HR 80 | Wt 197.4 lb

## 2017-07-20 DIAGNOSIS — O09629 Supervision of young multigravida, unspecified trimester: Secondary | ICD-10-CM

## 2017-07-20 DIAGNOSIS — O99210 Obesity complicating pregnancy, unspecified trimester: Secondary | ICD-10-CM

## 2017-07-20 NOTE — Progress Notes (Signed)
   PRENATAL VISIT NOTE  Subjective:  Carla [name] is a 27 y.o. [phone] at 7866w5d being seen today for ongoing prenatal care.  She is currently monitored for the following issues for this low-risk pregnancy and has Supervision of high-risk pregnancy of young multigravida; Obesity affecting pregnancy, antepartum; Vaginal bleeding during pregnancy, antepartum; and Hyperemesis affecting pregnancy, antepartum on their problem list.  Patient reports no complaints.  Contractions: Irregular. Vag. Bleeding: None.  Movement: Present. Denies leaking of fluid.   The following portions of the patient's history were reviewed and updated as appropriate: allergies, current medications, past family history, past medical history, past social history, past surgical history and problem list. Problem list updated.  Objective:   Vitals:   07/20/17 0857  BP: 118/79  Pulse: 80  Weight: 197 lb 6.4 oz (89.5 kg)    Fetal Status:     Movement: Present     General:  Alert, oriented and cooperative. Patient is in no acute distress.  Skin: Skin is warm and dry. No rash noted.   Cardiovascular: Normal heart rate noted  Respiratory: Normal respiratory effort, no problems with respiration noted  Abdomen: Soft, gravid, appropriate for gestational age.  Pain/Pressure: Present     Pelvic: Cervical exam deferred        Extremities: Normal range of motion.  Edema: None  Mental Status:  Normal mood and affect. Normal behavior. Normal judgment and thought content.   Assessment and Plan:  Pregnancy: [id number] at 166w5d  1. Obesity affecting pregnancy, antepartum  - US MFM OB FOLLOW UP; Future  2. Supervision of high-risk pregnancy of young multigravida   Preterm labor symptoms and general obstetric precautions including but not limited to vaginal bleeding, contractions, leaking of fluid and fetal movement were reviewed in detail with the patient. Please refer to After Visit Summary for other counseling  recommendations.  No Follow-up on file.   Allie BossierMyra C Jaquila Santelli, MD

## 2017-07-24 MED ORDER — RANITIDINE HCL 150 MG PO TABS: 150.0000 mg | ORAL_TABLET | Freq: Two times a day (BID) | ORAL | 1 refills | Status: DC

## 2017-08-03 ENCOUNTER — Ambulatory Visit (INDEPENDENT_AMBULATORY_CARE_PROVIDER_SITE_OTHER): Payer: Medicaid Other | Admitting: Obstetrics

## 2017-08-03 ENCOUNTER — Encounter: Payer: Self-pay | Admitting: Obstetrics

## 2017-08-03 VITALS — BP 115/78 | HR 80 | Wt 202.0 lb

## 2017-08-03 DIAGNOSIS — O99213 Obesity complicating pregnancy, third trimester: Secondary | ICD-10-CM

## 2017-08-03 DIAGNOSIS — O99210 Obesity complicating pregnancy, unspecified trimester: Secondary | ICD-10-CM

## 2017-08-03 DIAGNOSIS — F32A Depression, unspecified: Secondary | ICD-10-CM

## 2017-08-03 DIAGNOSIS — F419 Anxiety disorder, unspecified: Secondary | ICD-10-CM

## 2017-08-03 DIAGNOSIS — O09629 Supervision of young multigravida, unspecified trimester: Secondary | ICD-10-CM

## 2017-08-03 DIAGNOSIS — F329 Major depressive disorder, single episode, unspecified: Secondary | ICD-10-CM

## 2017-08-03 NOTE — Progress Notes (Signed)
Subjective:  Carla IhaCindy L [name] is a 27 y.o. [phone] at 4554w5d being seen today for ongoing prenatal care.  She is currently monitored for the following issues for this high-risk pregnancy and has Supervision of high-risk pregnancy of young multigravida; Obesity affecting pregnancy, antepartum; Vaginal bleeding during pregnancy, antepartum; and Hyperemesis affecting pregnancy, antepartum on their problem list.  Patient reports heartburn.  Contractions: Irregular. Vag. Bleeding: None.  Movement: Present. Denies leaking of fluid.   The following portions of the patient's history were reviewed and updated as appropriate: allergies, current medications, past family history, past medical history, past social history, past surgical history and problem list. Problem list updated.  Objective:   Vitals:   08/03/17 0909  BP: 115/78  Pulse: 80  Weight: 202 lb (91.6 kg)    Fetal Status: Fetal Heart Rate (bpm): 140   Movement: Present     General:  Alert, oriented and cooperative. Patient is in no acute distress.  Skin: Skin is warm and dry. No rash noted.   Cardiovascular: Normal heart rate noted  Respiratory: Normal respiratory effort, no problems with respiration noted  Abdomen: Soft, gravid, appropriate for gestational age. Pain/Pressure: Present     Pelvic:  Cervical exam deferred        Extremities: Normal range of motion.  Edema: None  Mental Status: Normal mood and affect. Normal behavior. Normal judgment and thought content.   Urinalysis:      Assessment and Plan:  Pregnancy: [id number] at 4254w5d  1. Supervision of high-risk pregnancy of young multigravida  2. Obesity affecting pregnancy, antepartum  3. Anxiety and depression   Preterm labor symptoms and general obstetric precautions including but not limited to vaginal bleeding, contractions, leaking of fluid and fetal movement were reviewed in detail with the patient. Please refer to After Visit Summary for other counseling  recommendations.  Return in about 1 week (around 08/10/2017) for ROB.   Brock BadHarper, Adarian Bur A, MD

## 2017-08-10 ENCOUNTER — Other Ambulatory Visit (HOSPITAL_COMMUNITY)
Admission: RE | Admit: 2017-08-10 | Discharge: 2017-08-10 | Disposition: A | Discharge status: Home / Self Care | Payer: Medicaid Other | Source: Ambulatory Visit | Attending: Obstetrics | Admitting: Obstetrics

## 2017-08-10 ENCOUNTER — Ambulatory Visit (INDEPENDENT_AMBULATORY_CARE_PROVIDER_SITE_OTHER): Payer: Medicaid Other | Admitting: Obstetrics

## 2017-08-10 VITALS — BP 128/88 | HR 83 | Wt 200.0 lb

## 2017-08-10 DIAGNOSIS — Z3480 Encounter for supervision of other normal pregnancy, unspecified trimester: Secondary | ICD-10-CM | POA: Diagnosis present

## 2017-08-10 DIAGNOSIS — Z3483 Encounter for supervision of other normal pregnancy, third trimester: Secondary | ICD-10-CM

## 2017-08-11 ENCOUNTER — Encounter: Payer: Self-pay | Admitting: Obstetrics

## 2017-08-11 NOTE — Progress Notes (Signed)
Subjective:  Carla IhaCindy L [name] is a 27 y.o. [phone] at [redacted]w[redacted]d being seen today for ongoing prenatal care.  She is currently monitored for the following issues for this low-risk pregnancy and has Supervision of high-risk pregnancy of young multigravida; Obesity affecting pregnancy, antepartum; Vaginal bleeding during pregnancy, antepartum; and Hyperemesis affecting pregnancy, antepartum on their problem list.  Patient reports no complaints.  Contractions: Irregular. Vag. Bleeding: None.  Movement: Present. Denies leaking of fluid.   The following portions of the patient's history were reviewed and updated as appropriate: allergies, current medications, past family history, past medical history, past social history, past surgical history and problem list. Problem list updated.  Objective:   Vitals:   08/10/17 1603  BP: 128/88  Pulse: 83  Weight: 200 lb (90.7 kg)    Fetal Status: Fetal Heart Rate (bpm): 140   Movement: Present     General:  Alert, oriented and cooperative. Patient is in no acute distress.  Skin: Skin is warm and dry. No rash noted.   Cardiovascular: Normal heart rate noted  Respiratory: Normal respiratory effort, no problems with respiration noted  Abdomen: Soft, gravid, appropriate for gestational age. Pain/Pressure: Present     Pelvic:  Cervical exam deferred        Extremities: Normal range of motion.     Mental Status: Normal mood and affect. Normal behavior. Normal judgment and thought content.   Urinalysis:      Assessment and Plan:  Pregnancy: [id number] at [redacted]w[redacted]d  1. Supervision of other normal pregnancy, antepartum Rx: - Strep Gp B NAA - Cervicovaginal ancillary only  Preterm labor symptoms and general obstetric precautions including but not limited to vaginal bleeding, contractions, leaking of fluid and fetal movement were reviewed in detail with the patient. Please refer to After Visit Summary for other counseling recommendations.  Return in about 1 week  (around 08/17/2017) for ROB.   Brock BadHarper, Adal Sereno A, MD

## 2017-08-12 LAB — STREP GP B NAA: STREP GROUP B AG: NEGATIVE

## 2017-08-14 LAB — CERVICOVAGINAL ANCILLARY ONLY
BACTERIAL VAGINITIS: NEGATIVE
CANDIDA VAGINITIS: NEGATIVE
CHLAMYDIA, DNA PROBE: NEGATIVE
Neisseria Gonorrhea: NEGATIVE
Trichomonas: NEGATIVE

## 2017-08-16 ENCOUNTER — Other Ambulatory Visit: Payer: Self-pay | Admitting: Obstetrics

## 2017-08-16 ENCOUNTER — Other Ambulatory Visit: Payer: Self-pay | Admitting: Obstetrics & Gynecology

## 2017-08-16 ENCOUNTER — Ambulatory Visit (HOSPITAL_COMMUNITY)
Admission: RE | Admit: 2017-08-16 | Discharge: 2017-08-16 | Disposition: A | Discharge status: Home / Self Care | Payer: Medicaid Other | Source: Ambulatory Visit | Attending: Obstetrics & Gynecology | Admitting: Obstetrics & Gynecology

## 2017-08-16 DIAGNOSIS — O99210 Obesity complicating pregnancy, unspecified trimester: Secondary | ICD-10-CM

## 2017-08-16 DIAGNOSIS — Z362 Encounter for other antenatal screening follow-up: Secondary | ICD-10-CM | POA: Diagnosis present

## 2017-08-16 DIAGNOSIS — Z3A36 36 weeks gestation of pregnancy: Secondary | ICD-10-CM | POA: Insufficient documentation

## 2017-08-16 DIAGNOSIS — E669 Obesity, unspecified: Secondary | ICD-10-CM | POA: Diagnosis not present

## 2017-08-16 DIAGNOSIS — M545 Low back pain: Principal | ICD-10-CM

## 2017-08-16 DIAGNOSIS — O99213 Obesity complicating pregnancy, third trimester: Secondary | ICD-10-CM | POA: Diagnosis present

## 2017-08-16 DIAGNOSIS — G8929 Other chronic pain: Secondary | ICD-10-CM

## 2017-08-16 NOTE — Addendum Note (Signed)
Encounter addended by: Lenise ArenaBazemore, Nyron Mozer, RDMS on: 08/16/2017 10:10 AM  Actions taken: Imaging Exam ended

## 2017-08-17 ENCOUNTER — Ambulatory Visit (INDEPENDENT_AMBULATORY_CARE_PROVIDER_SITE_OTHER): Payer: Medicaid Other | Admitting: Obstetrics

## 2017-08-17 ENCOUNTER — Encounter: Payer: Self-pay | Admitting: Obstetrics

## 2017-08-17 VITALS — BP 127/85 | HR 77 | Wt 204.4 lb

## 2017-08-17 DIAGNOSIS — Z3480 Encounter for supervision of other normal pregnancy, unspecified trimester: Secondary | ICD-10-CM

## 2017-08-17 NOTE — Progress Notes (Signed)
Subjective:  Carla [name] is [name] 27 y.o. [phone] at 10531w5d being seen today for ongoing prenatal care.  She is currently monitored for the following issues for this low-risk pregnancy and has Supervision of high-risk pregnancy of young multigravida; Obesity affecting pregnancy, antepartum; Vaginal bleeding during pregnancy, antepartum; and Hyperemesis affecting pregnancy, antepartum on their problem list.  Patient reports occasional contractions.  Contractions: Irregular. Vag. Bleeding: None.  Movement: Present. Denies leaking of fluid.   The following portions of the patient's history were reviewed and updated as appropriate: allergies, current medications, past family history, past medical history, past social history, past surgical history and problem list. Problem list updated.  Objective:   Vitals:   08/17/17 1003  BP: 127/85  Pulse: 77  Weight: 204 lb 6.4 oz (92.7 kg)    Fetal Status: Fetal Heart Rate (bpm): 140   Movement: Present     General:  Alert, oriented and cooperative. Patient is in no acute distress.  Skin: Skin is warm and dry. No rash noted.   Cardiovascular: Normal heart rate noted  Respiratory: Normal respiratory effort, no problems with respiration noted  Abdomen: Soft, gravid, appropriate for gestational age. Pain/Pressure: Present     Pelvic:  Cervical exam deferred        Extremities: Normal range of motion.  Edema: None  Mental Status: Normal mood and affect. Normal behavior. Normal judgment and thought content.   Urinalysis:      Assessment and Plan:  Pregnancy: [id number] at 1531w5d  1. Supervision of other normal pregnancy, antepartum   Preterm labor symptoms and general obstetric precautions including but not limited to vaginal bleeding, contractions, leaking of fluid and fetal movement were reviewed in detail with the patient. Please refer to After Visit Summary for other counseling recommendations.  Return in about 1 week (around 08/24/2017) for  ROB.   Brock BadHarper, Carla A, MD

## 2017-08-24 ENCOUNTER — Encounter (HOSPITAL_COMMUNITY): Payer: Self-pay | Admitting: *Deleted

## 2017-08-24 ENCOUNTER — Encounter: Payer: Self-pay | Admitting: Obstetrics

## 2017-08-24 ENCOUNTER — Other Ambulatory Visit: Payer: Self-pay

## 2017-08-24 ENCOUNTER — Ambulatory Visit (INDEPENDENT_AMBULATORY_CARE_PROVIDER_SITE_OTHER): Payer: Medicaid Other | Admitting: Obstetrics

## 2017-08-24 ENCOUNTER — Inpatient Hospital Stay (HOSPITAL_COMMUNITY)
Admission: AD | Admit: 2017-08-24 | Discharge: 2017-08-27 | DRG: 807 | Disposition: A | Discharge status: Home / Self Care | Payer: Medicaid Other | Source: Ambulatory Visit | Attending: Obstetrics and Gynecology | Admitting: Obstetrics and Gynecology

## 2017-08-24 VITALS — BP 129/90 | HR 82 | Wt 200.8 lb

## 2017-08-24 DIAGNOSIS — O99210 Obesity complicating pregnancy, unspecified trimester: Secondary | ICD-10-CM

## 2017-08-24 DIAGNOSIS — O99214 Obesity complicating childbirth: Secondary | ICD-10-CM | POA: Diagnosis present

## 2017-08-24 DIAGNOSIS — G4489 Other headache syndrome: Secondary | ICD-10-CM

## 2017-08-24 DIAGNOSIS — E669 Obesity, unspecified: Secondary | ICD-10-CM | POA: Diagnosis present

## 2017-08-24 DIAGNOSIS — O09629 Supervision of young multigravida, unspecified trimester: Secondary | ICD-10-CM

## 2017-08-24 DIAGNOSIS — O4690 Antepartum hemorrhage, unspecified, unspecified trimester: Secondary | ICD-10-CM

## 2017-08-24 DIAGNOSIS — O139 Gestational [pregnancy-induced] hypertension without significant proteinuria, unspecified trimester: Secondary | ICD-10-CM | POA: Insufficient documentation

## 2017-08-24 DIAGNOSIS — Z91040 Latex allergy status: Secondary | ICD-10-CM | POA: Diagnosis not present

## 2017-08-24 DIAGNOSIS — Z3480 Encounter for supervision of other normal pregnancy, unspecified trimester: Secondary | ICD-10-CM

## 2017-08-24 DIAGNOSIS — Z3A37 37 weeks gestation of pregnancy: Secondary | ICD-10-CM | POA: Diagnosis not present

## 2017-08-24 DIAGNOSIS — O134 Gestational [pregnancy-induced] hypertension without significant proteinuria, complicating childbirth: Principal | ICD-10-CM | POA: Diagnosis present

## 2017-08-24 DIAGNOSIS — O1213 Gestational proteinuria, third trimester: Secondary | ICD-10-CM

## 2017-08-24 DIAGNOSIS — O133 Gestational [pregnancy-induced] hypertension without significant proteinuria, third trimester: Secondary | ICD-10-CM

## 2017-08-24 DIAGNOSIS — R030 Elevated blood-pressure reading, without diagnosis of hypertension: Secondary | ICD-10-CM | POA: Diagnosis present

## 2017-08-24 DIAGNOSIS — Z3483 Encounter for supervision of other normal pregnancy, third trimester: Secondary | ICD-10-CM

## 2017-08-24 LAB — COMPREHENSIVE METABOLIC PANEL
ALT: 11 U/L — AB (ref 14–54)
AST: 18 U/L (ref 15–41)
Albumin: 2.8 g/dL — ABNORMAL LOW (ref 3.5–5.0)
Alkaline Phosphatase: 130 U/L — ABNORMAL HIGH (ref 38–126)
Anion gap: 10 (ref 5–15)
CHLORIDE: 106 mmol/L (ref 101–111)
CO2: 18 mmol/L — ABNORMAL LOW (ref 22–32)
CREATININE: 0.55 mg/dL (ref 0.44–1.00)
Calcium: 8.6 mg/dL — ABNORMAL LOW (ref 8.9–10.3)
GFR calc Af Amer: >60 mL/min (ref >60)
GFR calc non Af Amer: >60 mL/min (ref >60)
GLUCOSE: 120 mg/dL — AB (ref 65–99)
POTASSIUM: 3 mmol/L — AB (ref 3.5–5.1)
SODIUM: 134 mmol/L — AB (ref 135–145)
Total Bilirubin: 0.7 mg/dL (ref 0.3–1.2)
Total Protein: 6.3 g/dL — ABNORMAL LOW (ref 6.5–8.1)

## 2017-08-24 LAB — CBC
HCT: 31.4 % — ABNORMAL LOW (ref 36.0–46.0)
Hemoglobin: 10.3 g/dL — ABNORMAL LOW (ref 12.0–15.0)
MCH: 28.1 pg (ref 26.0–34.0)
MCHC: 32.8 g/dL (ref 30.0–36.0)
MCV: 85.6 fL (ref 78.0–100.0)
PLATELETS: 238 10*3/uL (ref 150–400)
RBC: 3.67 MIL/uL — AB (ref 3.87–5.11)
RDW: 16.2 % — AB (ref 11.5–15.5)
WBC: 8.2 10*3/uL (ref 4.0–10.5)

## 2017-08-24 LAB — URINALYSIS, ROUTINE W REFLEX MICROSCOPIC
BILIRUBIN URINE: NEGATIVE
GLUCOSE, UA: NEGATIVE mg/dL
HGB URINE DIPSTICK: NEGATIVE
Ketones, ur: NEGATIVE mg/dL
NITRITE: NEGATIVE
PROTEIN: NEGATIVE mg/dL
SPECIFIC GRAVITY, URINE: 1.011 (ref 1.005–1.030)
pH: 6 (ref 5.0–8.0)

## 2017-08-24 LAB — PROTEIN / CREATININE RATIO, URINE
Creatinine, Urine: 188 mg/dL
Protein Creatinine Ratio: 0.09 mg/mg{Cre} (ref 0.00–0.15)
Total Protein, Urine: 17 mg/dL

## 2017-08-24 LAB — TYPE AND SCREEN
ABO/RH(D): O POS
Antibody Screen: NEGATIVE

## 2017-08-24 MED ORDER — POTASSIUM CHLORIDE CRYS ER 20 MEQ PO TBCR
40.0000 meq | EXTENDED_RELEASE_TABLET | Freq: Once | ORAL | Status: AC
  Administered 2017-08-24: 40 meq via ORAL
  Filled 2017-08-24: qty 2

## 2017-08-24 MED ORDER — LACTATED RINGERS IV SOLN: 500.0000 mL | INTRAVENOUS | Status: DC | PRN

## 2017-08-24 MED ORDER — OXYTOCIN 40 UNITS IN LACTATED RINGERS INFUSION - SIMPLE MED
1.0000 m[IU]/min | INTRAVENOUS | Status: DC
  Administered 2017-08-24: 2 m[IU]/min via INTRAVENOUS
  Filled 2017-08-24: qty 1000

## 2017-08-24 MED ORDER — FENTANYL CITRATE (PF) 100 MCG/2ML IJ SOLN: 100.0000 ug | INTRAMUSCULAR | Status: DC | PRN

## 2017-08-24 MED ORDER — OXYCODONE-ACETAMINOPHEN 5-325 MG PO TABS: 1.0000 | ORAL_TABLET | ORAL | Status: DC | PRN

## 2017-08-24 MED ORDER — ONDANSETRON HCL 4 MG/2ML IJ SOLN
4.0000 mg | Freq: Four times a day (QID) | INTRAMUSCULAR | Status: DC | PRN
  Administered 2017-08-25: 4 mg via INTRAVENOUS
  Filled 2017-08-24: qty 2

## 2017-08-24 MED ORDER — LACTATED RINGERS IV SOLN
INTRAVENOUS | Status: DC
  Administered 2017-08-24 – 2017-08-25 (×2): via INTRAVENOUS

## 2017-08-24 MED ORDER — OXYTOCIN 40 UNITS IN LACTATED RINGERS INFUSION - SIMPLE MED: 2.5000 [IU]/h | INTRAVENOUS | Status: DC

## 2017-08-24 MED ORDER — TERBUTALINE SULFATE 1 MG/ML IJ SOLN
0.2500 mg | Freq: Once | INTRAMUSCULAR | Status: DC | PRN
  Filled 2017-08-24: qty 1

## 2017-08-24 MED ORDER — OXYCODONE-ACETAMINOPHEN 5-325 MG PO TABS: 2.0000 | ORAL_TABLET | ORAL | Status: DC | PRN

## 2017-08-24 MED ORDER — ACETAMINOPHEN 325 MG PO TABS: 650.0000 mg | ORAL_TABLET | ORAL | Status: DC | PRN

## 2017-08-24 MED ORDER — SOD CITRATE-CITRIC ACID 500-334 MG/5ML PO SOLN: 30.0000 mL | ORAL | Status: DC | PRN

## 2017-08-24 MED ORDER — LIDOCAINE HCL (PF) 1 % IJ SOLN
30.0000 mL | INTRAMUSCULAR | Status: DC | PRN
  Filled 2017-08-24: qty 30

## 2017-08-24 MED ORDER — OXYTOCIN BOLUS FROM INFUSION
500.0000 mL | Freq: Once | INTRAVENOUS | Status: AC
  Administered 2017-08-25: 500 mL via INTRAVENOUS

## 2017-08-24 NOTE — Progress Notes (Signed)
Subjective:  Carla [name] is a 27 y.o. [phone] at 134w5d being seen today for ongoing prenatal care.  She is currently monitored for the following issues for this low-risk pregnancy and has Supervision of high-risk pregnancy of young multigravida; Obesity affecting pregnancy, antepartum; Vaginal bleeding during pregnancy, antepartum; and Hyperemesis affecting pregnancy, antepartum on their problem list.  Patient reports headache, nausea and vomiting.  Contractions: Irregular. Vag. Bleeding: None.  Movement: Present. Denies leaking of fluid.   The following portions of the patient's history were reviewed and updated as appropriate: allergies, current medications, past family history, past medical history, past social history, past surgical history and problem list. Problem list updated.  Objective:   Vitals:   08/24/17 1010 08/24/17 1038  BP: (!) 139/93 129/90  Pulse: 78 82  Weight: 200 lb 12.8 oz (91.1 kg)     Fetal Status: Fetal Heart Rate (bpm): 140   Movement: Present     General:  Alert, oriented and cooperative. Patient is in no acute distress.  Skin: Skin is warm and dry. No rash noted.   Cardiovascular: Normal heart rate noted  Respiratory: Normal respiratory effort, no problems with respiration noted  Abdomen: Soft, gravid, appropriate for gestational age. Pain/Pressure: Present     Pelvic:  Cervical exam deferred        Extremities: Normal range of motion.  Edema: None  Mental Status: Normal mood and affect. Normal behavior. Normal judgment and thought content.   Urinalysis: Urine Protein: 3+ Urine Glucose: Negative  Assessment and Plan:  Pregnancy: [id number] at 8434w5d  1. Supervision of other normal pregnancy, antepartum   2. Other headache syndrome - [facility]  3. Proteinuria   There are no diagnoses linked to this encounter. Term labor symptoms and general obstetric precautions including but not limited to vaginal bleeding, contractions,  leaking of fluid and fetal movement were reviewed in detail with the patient. Please refer to After Visit Summary for other counseling recommendations.  Return in about 1 week (around 08/31/2017) for ROB.   Brock BadHarper, Jamyah Folk A, MD

## 2017-08-24 NOTE — Progress Notes (Addendum)
LABOR PROGRESS NOTE  Subjective:  Patient seen and examined for progress of labor. Patient comfortable without pain meds.   Objective:  Vitals:   08/24/17 2000 08/24/17 2030 08/24/17 2100 08/24/17 2130  BP: 123/85 (!) 127/91 135/75 127/73  Pulse: 80 79 83 82  Resp: 18     Temp:      TempSrc:      Weight:      Height:       Dilation: 3.5 Effacement (%): 70 Station: -3 Presentation: Vertex Exam by:: Sandy SalaamLynnsey Lecrone, RN FHT: 150 bpm, moderate variability, accelerations present, absent decelerations TOCO: regular, every 2 minutes  Assessment/Plan: Dyanne IhaCindy L Spindel is a 27 y.o. [id number] at 8026w5d here for IOL due to gHTN.  Appears comfortable despite frequent contractions with pain meds. Will increase pit as tolerated.  Labor: stage 1 Preeclampsia: N/A, gHTN w/BP 120/80s Fetal wellbeing: category 1 Pain control: IV pain meds PRN, planning on epidural I/D: neg Anticipated MOD: continue expectant management, anticipate SVD  Durward Parcelavid Bonita Brindisi, DO, PGY-2 08/24/2017, 9:42 PM

## 2017-08-24 NOTE — MAU Note (Signed)
Pt. Transferred to L&D for delivery.

## 2017-08-24 NOTE — H&P (Signed)
OBSTETRIC ADMISSION HISTORY AND PHYSICAL  Carla [name] is a 27 y.o. female [phone] @37 .5 wks sent from office for elevated BP. She had a HA earlier, took Tylenol and had relief. Denies visual disturbances and epigastric pain. Good FM. She plans on breastfeeding. She requests POP for birth control.  Dating: By [redacted]w[redacted]d [place] --->  Estimated Date of Delivery: 09/09/17  Sono:    @[redacted]w[redacted]d , CWD, normal anatomy, vtx presentation, 31.1g, 76%ile, EFW 6'13   Prenatal History/Complications: -HEG -Obesity  Past Medical History: Past Medical History:  Diagnosis Date  . Allergy   . Anemia   . Depression   . Headache(784.0)   . PONV (postoperative nausea and vomiting)   . Spontaneous abortion 12/03/2010    Past Surgical History: Past Surgical History:  Procedure Laterality Date  . CHOLECYSTECTOMY N/A 05/13/2015   Procedure: LAPAROSCOPIC CHOLECYSTECTOMY ;  Surgeon: Manus Rudd, MD;  Location: [facility] OR;  Service: General;  Laterality: N/A;  . DILATION AND EVACUATION  12/03/2010   Procedure: DILATATION AND EVACUATION (D&E);  Surgeon: Roseanna Rainbow, MD;  Location: [facility] ORS;  Service: Gynecology;  Laterality: N/A;  . OVARIAN CYST REMOVAL    . TONSILLECTOMY      Obstetrical History: OB History    Gravida  7   Para  1   Term  1   Preterm      AB  5   Living  1     SAB  3   TAB      Ectopic      Multiple      Live Births  1        Obstetric Comments  Patient has had multiple miscarriages- she has had 2 recent SAB. The most recent is reported 1 1/2 months ago. Pt states she may have had up to 8 pregnancies, has had multiple SAB- lost count.        Social History: Social History   Socioeconomic History  . Marital status: Married    Spouse name: Not on file  . Number of children: Not on file  . Years of education: Not on file  . Highest education level: Not on file  Occupational History  . Not on file  Social Needs  . Financial resource strain: Not on file  .  Food insecurity:    Worry: Not on file    Inability: Not on file  . Transportation needs:    Medical: Not on file    Non-medical: Not on file  Tobacco Use  . Smoking status: Never Smoker  . Smokeless tobacco: Never Used  Substance and Sexual Activity  . Alcohol use: No    Alcohol/week: 0.0 oz    Comment: Ocassionally  . Drug use: No  . Sexual activity: Yes    Partners: Male  Lifestyle  . Physical activity:    Days per week: Not on file    Minutes per session: Not on file  . Stress: Not on file  Relationships  . Social connections:    Talks on phone: Not on file    Gets together: Not on file    Attends religious service: Not on file    Active member of club or organization: Not on file    Attends meetings of clubs or organizations: Not on file    Relationship status: Not on file  Other Topics Concern  . Not on file  Social History Narrative  . Not on file    Family History: Family History  Problem  Relation Age of Onset  . Hypertension Other   . Diabetes Other   . Stroke Other   . Cancer Other   . Heart failure Other   . Seizures Other     Allergies: Allergies  Allergen Reactions  . Shellfish Allergy Anaphylaxis  . Latex Itching    Medications Prior to Admission  Medication Sig Dispense Refill Last Dose  . acetaminophen (TYLENOL) 500 MG tablet Take 1,000 mg by mouth every 6 (six) hours as needed for moderate pain or fever. Reported on 11/26/2015   08/24/2017 at Unknown time  . cyclobenzaprine (FLEXERIL) 5 MG tablet TAKE 1 TABLET BY MOUTH THREE TIMES DAILY AS NEEDED FOR MUSCLE SPASMS 30 tablet 0 Past Month at Unknown time  . doxylamine, Sleep, (UNISOM) 25 MG tablet Take 1 tablet (25 mg total) by mouth 2 (two) times daily at 8 am and 10 pm. 60 tablet 6 Past Week at Unknown time  . metoCLOPramide (REGLAN) 10 MG tablet TAKE 1 TABLET BY MOUTH EVERY 8 HOURS AS NEEDED FOR NAUSEA AND VOMITING 30 tablet 5 Past Week at Unknown time  . ondansetron (ZOFRAN ODT) 4 MG  disintegrating tablet Take 1 tablet (4 mg total) by mouth every 6 (six) hours as needed for nausea. 20 tablet 2 08/24/2017 at Unknown time  . Prenat-Fe Carbonyl-FA-Omega 3 (ONE-A-DAY WOMENS PRENATAL 1 PO) Take by mouth.   08/24/2017 at Unknown time  . pyridOXINE (VITAMIN B-6) 50 MG tablet Take 1 tablet (50 mg total) by mouth 2 (two) times daily at 8 am and 10 pm. 60 tablet 6 Past Week at Unknown time  . ranitidine (ZANTAC) 150 MG tablet TK 1 T PO BID  0 08/24/2017 at Unknown time  . Elastic Bandages & Supports (COMFORT FIT MATERNITY SUPP SM) MISC Wear as directed. 1 each 0 Taking  . folic acid (FOLVITE) 1 MG tablet Take 2 tablets (2 mg total) by mouth daily. 60 tablet 11 Taking  . pantoprazole (PROTONIX) 40 MG tablet Take 1 tablet (40 mg total) by mouth daily. (Patient not taking: Reported on 08/24/2017) 30 tablet 3 Not Taking at Unknown time   Review of Systems   All systems reviewed and negative except as stated in HPI  Blood pressure 122/90, pulse 81, temperature 98.5 F (36.9 C), temperature source Oral, resp. rate 18, height 5\' 4"  (1.626 m), weight 200 lb (90.7 kg), last menstrual period 11/13/2016. General appearance: alert, cooperative and no distress Lungs: regular rate and effort Heart: regular rate  Abdomen: soft, non-tender Extremities: Homans sign is negative, no sign of DVT Presentation: cephalic Fetal monitoringBaseline: 125 bpm, Variability: Good {> 6 bpm), Accelerations: Reactive and Decelerations: Absent Uterine activity rare     Prenatal labs: ABO, Rh: O/Positive/-- (09/27 1134) Antibody: Negative (09/27 1134) Rubella: 1.71 (09/27 1134) RPR: Non Reactive (01/17 1044)  HBsAg: Negative (09/27 1134)  HIV: Non Reactive (01/17 1044)  GBS: Negative (03/21 1706)  2 hr Glucola nml Genetic screening  NIPS nml Anatomy US nml  Prenatal Transfer Tool  Maternal Diabetes: No Genetic Screening: Normal Maternal Ultrasounds/Referrals: Normal Fetal Ultrasounds or other Referrals:   None Maternal Substance Abuse:  No Significant Maternal Medications:  None Significant Maternal Lab Results: None  Results for orders placed or performed during the hospital encounter of 08/24/17 (from the past 24 hour(s))  Protein / creatinine ratio, urine   Collection Time: 08/24/17 11:40 AM  Result Value Ref Range   Creatinine, Urine 188.00 mg/dL   Total Protein, Urine 17 mg/dL   Protein  Creatinine Ratio 0.09 0.00 - 0.15 mg/mg[Cre]  Urinalysis, Routine w reflex microscopic   Collection Time: 08/24/17 11:40 AM  Result Value Ref Range   Color, Urine YELLOW YELLOW   APPearance HAZY (A) CLEAR   Specific Gravity, Urine 1.011 1.005 - 1.030   pH 6.0 5.0 - 8.0   Glucose, UA NEGATIVE NEGATIVE mg/dL   Hgb urine dipstick NEGATIVE NEGATIVE   Bilirubin Urine NEGATIVE NEGATIVE   Ketones, ur NEGATIVE NEGATIVE mg/dL   Protein, ur NEGATIVE NEGATIVE mg/dL   Nitrite NEGATIVE NEGATIVE   Leukocytes, UA TRACE (A) NEGATIVE   RBC / HPF 0-5 0 - 5 RBC/hpf   WBC, UA 6-30 0 - 5 WBC/hpf   Bacteria, UA RARE (A) NONE SEEN   Squamous Epithelial / LPF 6-30 (A) NONE SEEN   Mucus PRESENT   CBC   Collection Time: 08/24/17 12:13 PM  Result Value Ref Range   WBC 8.2 4.0 - 10.5 K/uL   RBC 3.67 (L) 3.87 - 5.11 MIL/uL   Hemoglobin 10.3 (L) 12.0 - 15.0 g/dL   HCT 16.1 (L) 09.6 - 04.5 %   MCV 85.6 78.0 - 100.0 fL   MCH 28.1 26.0 - 34.0 pg   MCHC 32.8 30.0 - 36.0 g/dL   RDW 40.9 (H) 81.1 - 91.4 %   Platelets 238 150 - 400 K/uL  Comprehensive metabolic panel   Collection Time: 08/24/17 12:13 PM  Result Value Ref Range   Sodium 134 (L) 135 - 145 mmol/L   Potassium 3.0 (L) 3.5 - 5.1 mmol/L   Chloride 106 101 - 111 mmol/L   CO2 18 (L) 22 - 32 mmol/L   Glucose, Bld 120 (H) 65 - 99 mg/dL   BUN <5 (L) 6 - 20 mg/dL   Creatinine, Ser 7.82 0.44 - 1.00 mg/dL   Calcium 8.6 (L) 8.9 - 10.3 mg/dL   Total Protein 6.3 (L) 6.5 - 8.1 g/dL   Albumin 2.8 (L) 3.5 - 5.0 g/dL   AST 18 15 - 41 U/L   ALT 11 (L) 14 - 54 U/L    Alkaline Phosphatase 130 (H) 38 - 126 U/L   Total Bilirubin 0.7 0.3 - 1.2 mg/dL   GFR calc non Af Amer >60 >60 mL/min   GFR calc Af Amer >60 >60 mL/min   Anion gap 10 5 - 15    Patient Active Problem List   Diagnosis Date Noted  . Hyperemesis affecting pregnancy, antepartum 02/16/2017  . Supervision of high-risk pregnancy of young multigravida 01/24/2017  . Obesity affecting pregnancy, antepartum 01/24/2017  . Vaginal bleeding during pregnancy, antepartum 01/24/2017    Assessment: Carla [name] is a 27 y.o. [id number] at [redacted]w[redacted]d here for Gestational HTN  1. Labor: latent 2. FWB: Cat I 3. Pain: analgesia prn 4. GBS: neg   Plan: 1. Admit to BS 2. Pitocin 3. Anticipate SVD  Donette Larry, CNM  08/24/2017, 2:49 PM

## 2017-08-24 NOTE — MAU Note (Signed)
Pt sent from office for elevated b/p. Had headache earlier today but took some tylenol with relief. Has had headache and N/V on and off all week.

## 2017-08-25 ENCOUNTER — Inpatient Hospital Stay (HOSPITAL_COMMUNITY): Payer: Medicaid Other | Admitting: Anesthesiology

## 2017-08-25 ENCOUNTER — Encounter (HOSPITAL_COMMUNITY): Payer: Self-pay

## 2017-08-25 DIAGNOSIS — Z3A37 37 weeks gestation of pregnancy: Secondary | ICD-10-CM

## 2017-08-25 LAB — CBC
HCT: 31 % — ABNORMAL LOW (ref 36.0–46.0)
HEMOGLOBIN: 10.1 g/dL — AB (ref 12.0–15.0)
MCH: 27.6 pg (ref 26.0–34.0)
MCHC: 32.6 g/dL (ref 30.0–36.0)
MCV: 84.7 fL (ref 78.0–100.0)
Platelets: 223 10*3/uL (ref 150–400)
RBC: 3.66 MIL/uL — AB (ref 3.87–5.11)
RDW: 16.2 % — ABNORMAL HIGH (ref 11.5–15.5)
WBC: 14.8 10*3/uL — ABNORMAL HIGH (ref 4.0–10.5)

## 2017-08-25 LAB — RPR: RPR: NONREACTIVE

## 2017-08-25 MED ORDER — COCONUT OIL OIL: 1.0000 "application " | TOPICAL_OIL | Status: DC | PRN

## 2017-08-25 MED ORDER — COCONUT OIL OIL
1.0000 "application " | TOPICAL_OIL | Status: DC | PRN
  Administered 2017-08-25: 1 via TOPICAL
  Filled 2017-08-25: qty 120

## 2017-08-25 MED ORDER — TETANUS-DIPHTH-ACELL PERTUSSIS 5-2.5-18.5 LF-MCG/0.5 IM SUSP: 0.5000 mL | Freq: Once | INTRAMUSCULAR | Status: DC

## 2017-08-25 MED ORDER — MEASLES, MUMPS & RUBELLA VAC ~~LOC~~ INJ
0.5000 mL | INJECTION | Freq: Once | SUBCUTANEOUS | Status: DC
  Filled 2017-08-25: qty 0.5

## 2017-08-25 MED ORDER — ZOLPIDEM TARTRATE 5 MG PO TABS: 5.0000 mg | ORAL_TABLET | Freq: Every evening | ORAL | Status: DC | PRN

## 2017-08-25 MED ORDER — ONDANSETRON HCL 4 MG/2ML IJ SOLN: 4.0000 mg | INTRAMUSCULAR | Status: DC | PRN

## 2017-08-25 MED ORDER — WITCH HAZEL-GLYCERIN EX PADS: 1.0000 "application " | MEDICATED_PAD | CUTANEOUS | Status: DC | PRN

## 2017-08-25 MED ORDER — SIMETHICONE 80 MG PO CHEW: 80.0000 mg | CHEWABLE_TABLET | ORAL | Status: DC | PRN

## 2017-08-25 MED ORDER — DIPHENHYDRAMINE HCL 25 MG PO CAPS
25.0000 mg | ORAL_CAPSULE | Freq: Four times a day (QID) | ORAL | Status: DC | PRN
Start: 2017-08-25 — End: 2017-08-27

## 2017-08-25 MED ORDER — METHYLERGONOVINE MALEATE 0.2 MG/ML IJ SOLN: 0.2000 mg | INTRAMUSCULAR | Status: DC | PRN

## 2017-08-25 MED ORDER — IBUPROFEN 600 MG PO TABS
600.0000 mg | ORAL_TABLET | Freq: Four times a day (QID) | ORAL | Status: DC
  Administered 2017-08-25 – 2017-08-27 (×10): 600 mg via ORAL
  Filled 2017-08-25 (×10): qty 1

## 2017-08-25 MED ORDER — EPHEDRINE 5 MG/ML INJ
10.0000 mg | INTRAVENOUS | Status: DC | PRN
  Filled 2017-08-25: qty 2

## 2017-08-25 MED ORDER — PRENATAL MULTIVITAMIN CH
1.0000 | ORAL_TABLET | Freq: Every day | ORAL | Status: DC
  Administered 2017-08-25 – 2017-08-27 (×3): 1 via ORAL
  Filled 2017-08-25 (×3): qty 1

## 2017-08-25 MED ORDER — DOCUSATE SODIUM 100 MG PO CAPS
100.0000 mg | ORAL_CAPSULE | Freq: Two times a day (BID) | ORAL | Status: DC
  Administered 2017-08-25 – 2017-08-27 (×4): 100 mg via ORAL
  Filled 2017-08-25 (×4): qty 1

## 2017-08-25 MED ORDER — ACETAMINOPHEN 325 MG PO TABS
650.0000 mg | ORAL_TABLET | ORAL | Status: DC | PRN
  Administered 2017-08-25 – 2017-08-26 (×2): 650 mg via ORAL
  Filled 2017-08-25 (×2): qty 2

## 2017-08-25 MED ORDER — ACETAMINOPHEN 325 MG PO TABS: 650.0000 mg | ORAL_TABLET | ORAL | Status: DC | PRN

## 2017-08-25 MED ORDER — DIPHENHYDRAMINE HCL 50 MG/ML IJ SOLN: 12.5000 mg | INTRAMUSCULAR | Status: DC | PRN

## 2017-08-25 MED ORDER — FENTANYL 2.5 MCG/ML BUPIVACAINE 1/10 % EPIDURAL INFUSION (WH - ANES)
14.0000 mL/h | INTRAMUSCULAR | Status: DC | PRN
  Administered 2017-08-25: 14 mL/h via EPIDURAL
  Filled 2017-08-25: qty 100

## 2017-08-25 MED ORDER — BENZOCAINE-MENTHOL 20-0.5 % EX AERO: 1.0000 "application " | INHALATION_SPRAY | CUTANEOUS | Status: DC | PRN

## 2017-08-25 MED ORDER — BENZOCAINE-MENTHOL 20-0.5 % EX AERO
1.0000 | INHALATION_SPRAY | CUTANEOUS | Status: DC | PRN
Start: 2017-08-25 — End: 2017-08-27
  Administered 2017-08-25: 1 via TOPICAL

## 2017-08-25 MED ORDER — DIPHENHYDRAMINE HCL 25 MG PO CAPS: 25.0000 mg | ORAL_CAPSULE | Freq: Four times a day (QID) | ORAL | Status: DC | PRN

## 2017-08-25 MED ORDER — LACTATED RINGERS IV SOLN
500.0000 mL | Freq: Once | INTRAVENOUS | Status: AC
  Administered 2017-08-25: 500 mL via INTRAVENOUS

## 2017-08-25 MED ORDER — LIDOCAINE HCL (PF) 1 % IJ SOLN
INTRAMUSCULAR | Status: DC | PRN
  Administered 2017-08-25: 8 mL via EPIDURAL

## 2017-08-25 MED ORDER — DIBUCAINE 1 % RE OINT: 1.0000 "application " | TOPICAL_OINTMENT | RECTAL | Status: DC | PRN

## 2017-08-25 MED ORDER — BISACODYL 10 MG RE SUPP: 10.0000 mg | Freq: Every day | RECTAL | Status: DC | PRN

## 2017-08-25 MED ORDER — ONDANSETRON HCL 4 MG PO TABS: 4.0000 mg | ORAL_TABLET | ORAL | Status: DC | PRN

## 2017-08-25 MED ORDER — PRENATAL MULTIVITAMIN CH: 1.0000 | ORAL_TABLET | Freq: Every day | ORAL | Status: DC

## 2017-08-25 MED ORDER — FERROUS SULFATE 325 (65 FE) MG PO TABS
325.0000 mg | ORAL_TABLET | Freq: Two times a day (BID) | ORAL | Status: DC
  Administered 2017-08-25 – 2017-08-27 (×5): 325 mg via ORAL
  Filled 2017-08-25 (×5): qty 1

## 2017-08-25 MED ORDER — METHYLERGONOVINE MALEATE 0.2 MG PO TABS: 0.2000 mg | ORAL_TABLET | ORAL | Status: DC | PRN

## 2017-08-25 MED ORDER — DIBUCAINE 1 % RE OINT
1.0000 | TOPICAL_OINTMENT | RECTAL | Status: DC | PRN
Start: 2017-08-25 — End: 2017-08-27

## 2017-08-25 MED ORDER — FLEET ENEMA 7-19 GM/118ML RE ENEM: 1.0000 | ENEMA | Freq: Every day | RECTAL | Status: DC | PRN

## 2017-08-25 MED ORDER — PHENYLEPHRINE 40 MCG/ML (10ML) SYRINGE FOR IV PUSH (FOR BLOOD PRESSURE SUPPORT)
80.0000 ug | PREFILLED_SYRINGE | INTRAVENOUS | Status: DC | PRN
  Filled 2017-08-25: qty 5

## 2017-08-25 MED ORDER — IBUPROFEN 600 MG PO TABS: 600.0000 mg | ORAL_TABLET | Freq: Four times a day (QID) | ORAL | Status: DC

## 2017-08-25 MED ORDER — PHENYLEPHRINE 40 MCG/ML (10ML) SYRINGE FOR IV PUSH (FOR BLOOD PRESSURE SUPPORT)
80.0000 ug | PREFILLED_SYRINGE | INTRAVENOUS | Status: DC | PRN
  Filled 2017-08-25: qty 5
  Filled 2017-08-25: qty 10

## 2017-08-25 MED ORDER — SENNOSIDES-DOCUSATE SODIUM 8.6-50 MG PO TABS
2.0000 | ORAL_TABLET | ORAL | Status: DC
  Administered 2017-08-25 – 2017-08-26 (×2): 2 via ORAL
  Filled 2017-08-25 (×2): qty 2

## 2017-08-25 NOTE — Anesthesia Postprocedure Evaluation (Signed)
Anesthesia Post Note  Patient: Carla [name]  Procedure(s) Performed: AN AD HOC LABOR EPIDURAL     Patient location during evaluation: Mother Baby Anesthesia Type: Epidural Level of consciousness: awake and alert, oriented and patient cooperative Pain management: pain level controlled Vital Signs Assessment: post-procedure vital signs reviewed and stable Respiratory status: spontaneous breathing Cardiovascular status: stable Postop Assessment: no headache, epidural receding, patient able to bend at knees and no signs of nausea or vomiting Anesthetic complications: no Comments: Pain score 8.  Pt reports she had just received pain med immediately prior to our interview.    Last Vitals:  Vitals:   08/25/17 0600 08/25/17 0645  BP: 137/60 121/76  Pulse: 80 82  Resp: 18 18  Temp: 36.7 C 36.8 C  SpO2:  100%    Last Pain:  Vitals:   08/25/17 0600  TempSrc:   PainSc: 0-No pain   Pain Goal:                 [facility]INKLE,Carla [name]

## 2017-08-25 NOTE — Lactation Note (Signed)
This note was copied from a baby's chart. Lactation Consultation Note  Patient Name: Carla [name] OZHYQ'MToday's Date: 08/25/2017 Reason for consult: Initial assessment   P2, Baby 5 hours old and mother requested assistance.  BF first child for 3 mos. Reviewed hand expression and gave baby drops on spoon. Assisted w/ latching in football hold.  Encouraged breast compression to keep baby active. Reviewed waking techniques.  Suggest STS. Mom encouraged to feed baby 8-12 times/24 hours and with feeding cues.  Discussed basics. Mom made aware of O/P services, breastfeeding support groups, community resources, and our phone # for post-discharge questions.     Maternal Data    Feeding Feeding Type: Breast Fed Length of feed: 10 min  LATCH Score Latch: Grasps breast easily, tongue down, lips flanged, rhythmical sucking.  Audible Swallowing: A few with stimulation  Type of Nipple: Everted at rest and after stimulation  Comfort (Breast/Nipple): Soft / non-tender  Hold (Positioning): Assistance needed to correctly position infant at breast and maintain latch.  LATCH Score: 8  Interventions Interventions: Breast feeding basics reviewed;Assisted with latch;Breast compression  Lactation Tools Discussed/Used     Consult Status Consult Status: Follow-up Date: 08/26/17 Follow-up type: In-patient    Dahlia ByesBerkelhammer, Carla [name] 08/25/2017, 9:13 AM

## 2017-08-25 NOTE — Progress Notes (Signed)
Vitals:   08/24/17 2330 08/25/17 0000  BP: 131/84 134/87  Pulse: 83 86  Resp:    Temp:     FHR Cat 1.  On 20 mu/min Pitocin but ctx still mild, q 1-2  minutes.  Sl cx change per RN, now 4/70/-3.  Will continue to increase pitocin until ctx get stronger

## 2017-08-25 NOTE — Anesthesia Procedure Notes (Signed)
Epidural Patient location during procedure: OB Start time: 08/25/2017 2:20 AM End time: 08/25/2017 2:25 AM  Staffing Anesthesiologist: Bethena Midgetddono, Rice Walsh, MD  Preanesthetic Checklist Completed: patient identified, site marked, surgical consent, pre-op evaluation, timeout performed, IV checked, risks and benefits discussed and monitors and equipment checked  Epidural Patient position: sitting Prep: site prepped and draped and DuraPrep Patient monitoring: continuous pulse ox and blood pressure Approach: midline Location: L3-L4 Injection technique: LOR air  Needle:  Needle type: Tuohy  Needle gauge: 17 G Needle length: 9 cm and 9 Needle insertion depth: 7 cm Catheter type: closed end flexible Catheter size: 19 Gauge Catheter at skin depth: 12 cm Test dose: negative  Assessment Events: blood not aspirated, injection not painful, no injection resistance, negative IV test and no paresthesia

## 2017-08-25 NOTE — Progress Notes (Signed)
MOB was referred for history of depression/anxiety. * Referral screened out by Clinical Social Worker because none of the following criteria appear to apply: ~ History of anxiety/depression during this pregnancy, or of post-partum depression. ~ Diagnosis of anxiety and/or depression within last 3 years OR * MOB's symptoms currently being treated with medication and/or therapy. Please contact the Clinical Social Worker if needs arise, by MOB request, or if MOB scores greater than 9/yes to question 10 on Edinburgh Postpartum Depression Screen.  Carla [name], MSW, LCSW Clinical Social Work [phone] 

## 2017-08-25 NOTE — Anesthesia Preprocedure Evaluation (Signed)
Anesthesia Evaluation  Patient identified by MRN, date of birth, ID band Patient awake    Reviewed: Allergy & Precautions, H&P , NPO status , Patient's Chart, lab work & pertinent test results, reviewed documented beta blocker date and time   Airway Mallampati: II  TM Distance: >3 FB Neck ROM: full    Dental no notable dental hx.    Pulmonary neg pulmonary ROS,    Pulmonary exam normal breath sounds clear to auscultation       Cardiovascular negative cardio ROS Normal cardiovascular exam Rhythm:regular Rate:Normal     Neuro/Psych negative neurological ROS  negative psych ROS   GI/Hepatic negative GI ROS, Neg liver ROS,   Endo/Other  negative endocrine ROS  Renal/GU negative Renal ROS  negative genitourinary   Musculoskeletal   Abdominal   Peds  Hematology negative hematology ROS (+)   Anesthesia Other Findings   Reproductive/Obstetrics (+) Pregnancy                             Anesthesia Physical Anesthesia Plan  ASA: II  Anesthesia Plan: Epidural   Post-op Pain Management:    Induction:   PONV Risk Score and Plan:   Airway Management Planned:   Additional Equipment:   Intra-op Plan:   Post-operative Plan:   Informed Consent: I have reviewed the patients History and Physical, chart, labs and discussed the procedure including the risks, benefits and alternatives for the proposed anesthesia with the patient or authorized representative who has indicated his/her understanding and acceptance.     Plan Discussed with:   Anesthesia Plan Comments:         Anesthesia Quick Evaluation  

## 2017-08-26 ENCOUNTER — Other Ambulatory Visit: Payer: Self-pay

## 2017-08-26 LAB — BIRTH TISSUE RECOVERY COLLECTION (PLACENTA DONATION)

## 2017-08-26 NOTE — Lactation Note (Signed)
This note was copied from a baby's chart. Lactation Consultation Note  Patient Name: Girl Dorena CookeyCindy Fayson ZOXWR'UToday's Date: 08/26/2017 Reason for consult: Follow-up assessment   Mother states it feels like baby is biting. Mom has my # to call for assist w/next feeding.    Maternal Data Has patient been taught Hand Expression?: Yes  Feeding    LATCH Score                   Interventions Interventions: Hand express;DEBP  Lactation Tools Discussed/Used Pump Review: Setup, frequency, and cleaning;Milk Storage Initiated by:: Pollyann KennedySamantha Patteson, RN [phone] Date initiated:: 08/26/17   Consult Status Consult Status: Follow-up Date: 08/26/17 Follow-up type: In-patient    Dahlia ByesBerkelhammer, [facility] 08/26/2017, 1:36 PM

## 2017-08-26 NOTE — Lactation Note (Addendum)
This note was copied from a baby's chart. Lactation Consultation Note  Patient Name: Girl Dorena CookeyCindy Claire JXBJY'NToday's Date: 08/26/2017 Reason for consult: Follow-up assessment   Baby 34 hours old.  Mother called for LC to view latch. Baby had fussy period during the night and is worried today. Mother hand expressed flow before latching. Mother latched baby in football hold. Sucks and swallows observed.  Mother compressed breast during feeding. Suggest pumping a few times today and give baby back volume pumped. Taught how to finger syringe feed 6 ml of colostrum. Observed feeding on both breasts. Encouraged mother.   Maternal Data Has patient been taught Hand Expression?: Yes  Feeding Feeding Type: Breast Fed  LATCH Score Latch: Grasps breast easily, tongue down, lips flanged, rhythmical sucking.  Audible Swallowing: Spontaneous and intermittent  Type of Nipple: Everted at rest and after stimulation  Comfort (Breast/Nipple): Soft / non-tender  Hold (Positioning): Assistance needed to correctly position infant at breast and maintain latch.  LATCH Score: 9  Interventions Interventions: Breast feeding basics reviewed;Hand express  Lactation Tools Discussed/Used     Consult Status Consult Status: Follow-up Date: 08/27/17 Follow-up type: In-patient    Dahlia ByesBerkelhammer, Ruth [facility] 08/26/2017, 1:50 PM

## 2017-08-26 NOTE — Progress Notes (Signed)
Post Partum Day 1 Subjective: no complaints, up ad lib, voiding, tolerating PO and + flatus  Objective: Blood pressure 127/87, pulse 75, temperature 98.4 F (36.9 C), temperature source Oral, resp. rate 16, height 5\' 4"  (1.626 m), weight 90.7 kg (200 lb), last menstrual period 11/13/2016, SpO2 100 %, unknown if currently breastfeeding.  Physical Exam:  General: alert and cooperative Lochia: appropriate Uterine Fundus: firm Incision: NA DVT Evaluation: No evidence of DVT seen on physical exam.  Recent Labs    08/24/17 1213 08/25/17 0555  HGB 10.3* 10.1*  HCT 31.4* 31.0*    Assessment/Plan: Plan for discharge tomorrow, Breastfeeding, Lactation consult, Social Work consult and Contraception POP   LOS: 2 days   Myrene BuddyJacob Rachal Dvorsky 08/26/2017, 1:11 PM

## 2017-08-26 NOTE — Lactation Note (Addendum)
This note was copied from a baby's chart. Lactation Consultation Note  Patient Name: Carla [name] ZOXWR'UToday's Date: 08/26/2017   Baby 30 hours old. 2051w5d. [name] setting up DEBP which was not working so LC switched out pump. Mother states during the night baby had trouble sustaining latch and per mother Carla [name] patiently help mother trying to help baby latch until baby became so frustrated formula supplementation was started. Carla [name] reviewed volume guidelines with mother this morning and set up DEBP. Mother pumped approx 5 ml of colostrum this morning while LC compressed breasts and discussed plan. Mom has my # to call for assist w/next feeding. LC encouraged mother.     Maternal Data    Feeding Feeding Type: Bottle Fed - Formula Length of feed: 5 min  LATCH Score                   Interventions    Lactation Tools Discussed/Used Pump Review: Setup, frequency, and cleaning;Milk Storage Initiated by:: Carla KennedySamantha Patteson, [name] [phone] Date initiated:: 08/26/17   Consult Status      Carla [name], Carla [name] 08/26/2017, 10:06 AM

## 2017-08-26 NOTE — Progress Notes (Signed)
CSW met with patient and newborn at bedside to discuss consult for history of anxiety and depression. Patient scored a 16 on her EDPS, CSW inquired about score with patient and she stated that she had a rough night last night with stress and breastfeeding. Patient states that she sees Missy at [facility] in [place]. Patient stated that she is not on any psychotropic medications at this time and does not feel she needs to be on any. Patient stated that her hormones have been unstable since she got pregnant while on birth control pills. Patient reports having support from her husband, Carla [name] and her parents. Patient stated that the newborn will sleep in a bassinet, CSW reviewed SIDS reduction and safe sleep techniques. Patient is not experiencing any suicidal or homicidal ideations. CSW encouraged patient to reach out for assistance if needs arise.  Carla [name], MSW, LCSW-A Clinical Social Worker [facility] [phone]   

## 2017-08-26 NOTE — Lactation Note (Signed)
This note was copied from a baby's chart. Lactation Consultation Note Baby 24 hrs old. Sleepy. Has BF well, now not interested d/t spitty. Noted baby jittery when unwrapped. Mom encouraged STS. Mom wearing shells. After shells removed, nipple everting. Praised mom, encouraged to hand express and spoon feed baby. Mom states baby has been jittery. RN in rm. Ordering serum glucose. Mom encouraged to feed baby 8-12 times/24 hours and with feeding cues. Mom encouraged to waken baby for feeds if hasn't fed in 3 hrs. Newborn feeding habits, I&O, cluster feeding,supply and demand.  Encouraged mom to call if baby doesn't improve by mid morning alert RN.  Patient Name: Carla [name] ZOXWR'UToday's Date: 08/26/2017 Reason for consult: Initial assessment;Early term 37-38.6wks   Maternal Data Has patient been taught Hand Expression?: Yes Does the patient have breastfeeding experience prior to this delivery?: Yes  Feeding Feeding Type: Breast Fed Length of feed: 5 min  LATCH Score Latch: Too sleepy or reluctant, no latch achieved, no sucking elicited.  Audible Swallowing: None  Type of Nipple: Everted at rest and after stimulation  Comfort (Breast/Nipple): Soft / non-tender  Hold (Positioning): Full assist, staff holds infant at breast  LATCH Score: 6  Interventions Interventions: Breast feeding basics reviewed;Skin to skin;Pre-pump if needed;Breast compression;Adjust position  Lactation Tools Discussed/Used Tools: Shells;Pump Shell Type: Inverted Breast pump type: Manual   Consult Status Consult Status: Follow-up Date: 08/26/17 Follow-up type: In-patient    Charyl DancerCARVER, [name] 08/26/2017, 4:13 AM

## 2017-08-26 NOTE — Progress Notes (Signed)
Parent request formula to supplement breast feeding due to mother request, not seeing "milk"Parents have been informed of small tummy size of newborn, taught hand expression and understands the possible consequences of formula to the health of the infant. The possible consequences shared with patient include 1) Loss of confidence in breastfeeding 2) Engorgement 3) Allergic sensitization of baby(asthma/allergies) and 4) decreased milk supply for mother.After discussion of the above the mother decided to supplement with formula. The tool used to give formula supplement will be bottle with slow flow nipple.  Carla Kester L Brylee Mcgreal, RN

## 2017-08-27 MED ORDER — IBUPROFEN 600 MG PO TABS: 600.0000 mg | ORAL_TABLET | Freq: Four times a day (QID) | ORAL | 0 refills | Status: DC

## 2017-08-27 NOTE — Lactation Note (Signed)
This note was copied from a baby's chart. Lactation Consultation Note  Patient Name: Carla [name] ZOXWR'UToday's Date: 08/27/2017 Reason for consult: Follow-up assessment   Baby is latching well per mother and mother is supplementing with her own breastmilk. She is pumping approx 15 ml. Reviewed engorgement care and monitoring voids/stools. Encouraged support group and OP appt if needed.   Maternal Data    Feeding Feeding Type: Breast Fed  LATCH Score Latch: Grasps breast easily, tongue down, lips flanged, rhythmical sucking.  Audible Swallowing: A few with stimulation  Type of Nipple: Everted at rest and after stimulation  Comfort (Breast/Nipple): Soft / non-tender  Hold (Positioning): No assistance needed to correctly position infant at breast.  LATCH Score: 9  Interventions Interventions: Assisted with latch;Hand express;Support pillows  Lactation Tools Discussed/Used     Consult Status Consult Status: Complete Date: 08/27/17    Dahlia ByesBerkelhammer, Ruth [facility] 08/27/2017, 10:13 AM

## 2017-08-27 NOTE — Discharge Summary (Addendum)
OB Discharge Summary     Patient Name: Carla [name] DOB: 09/23/1990 MRN: [id number]  Date of admission: 08/24/2017 Delivering MD: Jacklyn ShellRESENZO-DISHMON, FRANCES   Date of discharge: 08/27/2017  Admitting diagnosis: 37WKS, ELEVATED BP Intrauterine pregnancy: 5431w6d     Secondary diagnosis:  Active Problems:   NSVD (normal spontaneous vaginal delivery)  Additional problems: late preterm     Discharge diagnosis: Term Pregnancy Delivered and Gestational Hypertension                                                                                                Post partum procedures:none  Augmentation: Pitocin  Complications: None  Hospital course:  Onset of Labor With Vaginal Delivery     27 y.o. yo [id number] at 8231w6d was admitted in Latent Labor on 08/24/2017. Patient had an uncomplicated labor course as follows:  Membrane Rupture Time/Date: 1:56 AM ,08/25/2017   Intrapartum Procedures: Episiotomy: None [1]                                         Lacerations:  1st degree [2]  Patient had a delivery of a Viable infant. 08/25/2017  Information for the patient's newborn:  Vela ProseJohnson, Girl Ellsie [[id number]][[id number]]  Delivery Method: Vaginal, Spontaneous(Filed from Delivery Summary)    Pateint had an uncomplicated postpartum course.  She is ambulating, tolerating a regular diet, passing flatus, and urinating well. Patient is discharged home in stable condition on 08/27/17.   Physical exam  Vitals:   08/25/17 1932 08/26/17 0510 08/26/17 1900 08/27/17 0534  BP: 127/90 127/87 (!) 134/93 120/86  Pulse: 76 75 74 (!) 58  Resp: 16 16 20 20   Temp: 98.3 F (36.8 C) 98.4 F (36.9 C) 98.3 F (36.8 C) 97.7 F (36.5 C)  TempSrc: Oral Oral Oral Oral  SpO2:      Weight:      Height:       General: alert, cooperative and no distress Lochia: appropriate Uterine Fundus: firm Incision: N/A DVT Evaluation: No evidence of DVT seen on physical exam. Labs: Lab Results  Component Value Date   WBC 14.8 (H)  08/25/2017   HGB 10.1 (L) 08/25/2017   HCT 31.0 (L) 08/25/2017   MCV 84.7 08/25/2017   PLT 223 08/25/2017   CMP Latest Ref Rng & Units 08/24/2017  Glucose 65 - 99 mg/dL 696(E120(H)  BUN 6 - 20 mg/dL <9(B<5(L)  Creatinine 2.840.44 - 1.00 mg/dL 1.320.55  Sodium 440135 - 102145 mmol/L 134(L)  Potassium 3.5 - 5.1 mmol/L 3.0(L)  Chloride 101 - 111 mmol/L 106  CO2 22 - 32 mmol/L 18(L)  Calcium 8.9 - 10.3 mg/dL 7.2(Z8.6(L)  Total Protein 6.5 - 8.1 g/dL 6.3(L)  Total Bilirubin 0.3 - 1.2 mg/dL 0.7  Alkaline Phos 38 - 126 U/L 130(H)  AST 15 - 41 U/L 18  ALT 14 - 54 U/L 11(L)    Discharge instruction: per After Visit Summary and "Baby and Me Booklet".  After visit meds:  Allergies as of 08/27/2017  Reactions   Shellfish Allergy Anaphylaxis   Latex Itching      Medication List    STOP taking these medications   acetaminophen 500 MG tablet Commonly known as:  TYLENOL   COMFORT FIT MATERNITY SUPP SM Misc   folic acid 1 MG tablet Commonly known as:  FOLVITE   ondansetron 4 MG disintegrating tablet Commonly known as:  ZOFRAN ODT   ONE-A-DAY WOMENS PRENATAL 1 PO   pyridOXINE 50 MG tablet Commonly known as:  VITAMIN B-6   ranitidine 150 MG tablet Commonly known as:  ZANTAC     TAKE these medications   cyclobenzaprine 5 MG tablet Commonly known as:  FLEXERIL TAKE 1 TABLET BY MOUTH THREE TIMES DAILY AS NEEDED FOR MUSCLE SPASMS   doxylamine (Sleep) 25 MG tablet Commonly known as:  UNISOM Take 1 tablet (25 mg total) by mouth 2 (two) times daily at 8 am and 10 pm.   ibuprofen 600 MG tablet Commonly known as:  ADVIL,MOTRIN Take 1 tablet (600 mg total) by mouth every 6 (six) hours.   metoCLOPramide 10 MG tablet Commonly known as:  REGLAN TAKE 1 TABLET BY MOUTH EVERY 8 HOURS AS NEEDED FOR NAUSEA AND VOMITING   pantoprazole 40 MG tablet Commonly known as:  PROTONIX Take 1 tablet (40 mg total) by mouth daily.       Diet: routine diet  Activity: Advance as tolerated. Pelvic rest for 6 weeks.    Outpatient follow up:6 weeks Follow up Appt: Future Appointments  Date Time Provider Department Center  09/25/2017  8:45 AM Constant, Gigi Gin, MD [facility] None   Follow up Visit:No follow-ups on file.  Postpartum contraception: Progesterone only pills  Newborn Data: Live born female  Birth Weight: 6 lb 8.1 oz (2951 g) APGAR: 8, 9  Newborn Delivery   Birth date/time:  08/25/2017 03:32:00 Delivery type:  Vaginal, Spontaneous     Baby Feeding: Breast Disposition:home with mother   Patient with history of gestational htn. Will need babylove nurse visit in 1 week for blood pressure check.   08/27/2017 Myrene Buddy, MD  I assessed thid pt and agree with the above assessment

## 2017-09-01 ENCOUNTER — Encounter: Payer: Medicaid Other | Admitting: Obstetrics

## 2017-09-02 NOTE — Addendum Note (Signed)
Addendum  created 09/02/17 1150 by Bethena Midgetddono, Geralynn Capri, MD   Sign clinical note

## 2017-09-02 NOTE — Anesthesia Postprocedure Evaluation (Signed)
Anesthesia Post Note  Patient: Carla [name]  Procedure(s) Performed: AN AD HOC LABOR EPIDURAL     Patient location during evaluation: Mother Baby Anesthesia Type: Epidural Level of consciousness: awake and alert Pain management: pain level controlled Vital Signs Assessment: post-procedure vital signs reviewed and stable Respiratory status: spontaneous breathing, nonlabored ventilation and respiratory function stable Cardiovascular status: stable Postop Assessment: no headache, no backache and epidural receding Anesthetic complications: no    Last Vitals: There were no vitals filed for this visit.  Last Pain: There were no vitals filed for this visit. Pain Goal:                 Kasai Beltran

## 2017-09-25 ENCOUNTER — Ambulatory Visit: Payer: Medicaid Other | Admitting: Obstetrics and Gynecology

## 2017-09-28 ENCOUNTER — Ambulatory Visit (INDEPENDENT_AMBULATORY_CARE_PROVIDER_SITE_OTHER): Payer: Medicaid Other | Admitting: Obstetrics

## 2017-09-28 ENCOUNTER — Encounter: Payer: Self-pay | Admitting: Obstetrics

## 2017-09-28 DIAGNOSIS — Z30011 Encounter for initial prescription of contraceptive pills: Secondary | ICD-10-CM

## 2017-09-28 DIAGNOSIS — F53 Postpartum depression: Secondary | ICD-10-CM

## 2017-09-28 DIAGNOSIS — Z3009 Encounter for other general counseling and advice on contraception: Secondary | ICD-10-CM

## 2017-09-28 DIAGNOSIS — O99345 Other mental disorders complicating the puerperium: Secondary | ICD-10-CM

## 2017-09-28 DIAGNOSIS — Z1389 Encounter for screening for other disorder: Secondary | ICD-10-CM | POA: Diagnosis not present

## 2017-09-28 MED ORDER — NORETHINDRONE 0.35 MG PO TABS: 1.0000 | ORAL_TABLET | Freq: Every day | ORAL | 11 refills | Status: DC

## 2017-09-28 NOTE — Progress Notes (Addendum)
Post Partum Exam  Carla [name] is a 27 y.o. [phone] female who presents for a postpartum visit. She is 5 weeks postpartum following a spontaneous vaginal delivery. I have fully reviewed the prenatal and intrapartum course. The delivery was at [redacted]wks gestational weeks.  Anesthesia: epidural. Postpartum course has been unremarkable. Baby's course has been unremarkable. Baby is feeding by breast. Bleeding staining only. Bowel function is normal. Bladder function is normal. Patient is not sexually active. Contraception method is none. Postpartum depression screening: POSITIVE  The following portions of the patient's history were reviewed and updated as appropriate: allergies, current medications, past family history, past medical history, past social history, past surgical history and problem list. Last pap smear done 10/28/2014 and was Normal  Review of Systems A comprehensive review of systems was negative except for: Behavioral/Psych: positive for depression    Objective:   PE:  Deferred  Assessment and Plan:    1. Postpartum care following vaginal delivery  2. Postpartum depression - counseling is ongoing - stable clinically  3. Encounter for other general counseling and advice on contraception - wants OCP's  4. Encounter for initial prescription of contraceptive pills Rx: - norethindrone (MICRONOR,CAMILA,ERRIN) 0.35 MG tablet; Take 1 tablet (0.35 mg total) by mouth daily.  Dispense: 1 Package; Refill: 11   Plan:   1. Contraception: oral progesterone-only contraceptive ( Breast feeding ) 2. Micronor Rx 3. Follow up in: 6 weeks or as needed.    Brock Bad MD 09-28-2017

## 2017-11-02 ENCOUNTER — Ambulatory Visit (INDEPENDENT_AMBULATORY_CARE_PROVIDER_SITE_OTHER): Payer: Medicaid Other | Admitting: Obstetrics

## 2017-11-02 ENCOUNTER — Encounter: Payer: Self-pay | Admitting: Obstetrics

## 2017-11-02 ENCOUNTER — Other Ambulatory Visit (HOSPITAL_COMMUNITY)
Admission: RE | Admit: 2017-11-02 | Discharge: 2017-11-02 | Disposition: A | Discharge status: Home / Self Care | Payer: Medicaid Other | Source: Ambulatory Visit | Attending: Obstetrics | Admitting: Obstetrics

## 2017-11-02 VITALS — BP 143/101 | HR 73 | Ht 65.0 in | Wt 185.0 lb

## 2017-11-02 DIAGNOSIS — N898 Other specified noninflammatory disorders of vagina: Secondary | ICD-10-CM | POA: Diagnosis present

## 2017-11-02 DIAGNOSIS — O165 Unspecified maternal hypertension, complicating the puerperium: Secondary | ICD-10-CM

## 2017-11-02 DIAGNOSIS — Z0000 Encounter for general adult medical examination without abnormal findings: Secondary | ICD-10-CM | POA: Diagnosis not present

## 2017-11-02 DIAGNOSIS — Z113 Encounter for screening for infections with a predominantly sexual mode of transmission: Secondary | ICD-10-CM

## 2017-11-02 DIAGNOSIS — Z124 Encounter for screening for malignant neoplasm of cervix: Secondary | ICD-10-CM | POA: Insufficient documentation

## 2017-11-02 DIAGNOSIS — Z3202 Encounter for pregnancy test, result negative: Secondary | ICD-10-CM

## 2017-11-02 DIAGNOSIS — Z01419 Encounter for gynecological examination (general) (routine) without abnormal findings: Secondary | ICD-10-CM

## 2017-11-02 DIAGNOSIS — N912 Amenorrhea, unspecified: Secondary | ICD-10-CM

## 2017-11-02 DIAGNOSIS — D508 Other iron deficiency anemias: Secondary | ICD-10-CM

## 2017-11-02 LAB — POCT URINE PREGNANCY: Preg Test, Ur: NEGATIVE

## 2017-11-02 MED ORDER — CARVEDILOL 6.25 MG PO TABS: 6.2500 mg | ORAL_TABLET | Freq: Two times a day (BID) | ORAL | 5 refills | Status: DC

## 2017-11-02 MED ORDER — CITRANATAL BLOOM 90-1 MG PO TABS: 1.0000 | ORAL_TABLET | Freq: Every day | ORAL | 11 refills | Status: DC

## 2017-11-02 MED ORDER — TRIAMTERENE-HCTZ 37.5-25 MG PO CAPS: 1.0000 | ORAL_CAPSULE | Freq: Every day | ORAL | 5 refills | Status: DC

## 2017-11-02 NOTE — Progress Notes (Signed)
Patient presents for her Annual Exam today.  LMP:Has not had cycle yet  Last pap: 10/28/2014 WNL  Contraception: Pills STD Screening: Full Panel  Pt is currently Breastfeeding.  UPT per pt request is NEGATIVE .   CC: HA's and visual changes states she see floaters. Denies any swelling.

## 2017-11-02 NOTE — Progress Notes (Signed)
Subjective:        Carla [name] is a 27 y.o. female here for a routine exam.  Current complaints: Headaches and blurred vision.  Still feeling sad, but less.  She continues with counseling   Personal health questionnaire:  Is patient Ashkenazi Jewish, have a family history of breast and/or ovarian cancer: no Is there a family history of uterine cancer diagnosed at age < 6750, gastrointestinal cancer, urinary tract cancer, family member who is a Personnel officerLynch syndrome-associated carrier: no Is the patient overweight and hypertensive, family history of diabetes, personal history of gestational diabetes, preeclampsia or PCOS: no Is patient over 855, have PCOS,  family history of premature CHD under age 29, diabetes, smoke, have hypertension or peripheral artery disease:  no At any time, has a partner hit, kicked or otherwise hurt or frightened you?: no Over the past 2 weeks, have you felt down, depressed or hopeless?: no Over the past 2 weeks, have you felt little interest or pleasure in doing things?:no   Gynecologic History No LMP recorded. Contraception: oral progesterone-only contraceptive Last Pap: 2016. Results were: normal Last mammogram: n/a. Results were: n/a  Obstetric History OB History  Gravida Para Term Preterm AB Living  7 2 2   5 2   SAB TAB Ectopic Multiple Live Births  3     0 2    # Outcome Date GA Lbr Len/2nd Weight Sex Delivery Anes PTL Lv  7 Term 08/25/17 4635w6d 01:09 / 00:27 6 lb 8.1 oz (2.951 kg) F Vag-Spont EPI  LIV     Birth Comments: None  6 Term 05/25/13 4450w5d 11:01 / 01:14 6 lb 12.8 oz (3.085 kg) F Vag-Spont EPI  LIV  5 AB           4 AB           3 SAB           2 SAB           1 SAB             Obstetric Comments  Patient has had multiple miscarriages- she has had 2 recent SAB. The most recent is reported 1 1/2 months ago.  Pt states she may have had up to 8 pregnancies, has had multiple SAB- lost count.    Past Medical History:  Diagnosis Date  .  Allergy   . Anemia   . Depression   . Headache(784.0)   . PONV (postoperative nausea and vomiting)   . Spontaneous abortion 12/03/2010    Past Surgical History:  Procedure Laterality Date  . CHOLECYSTECTOMY N/A 05/13/2015   Procedure: LAPAROSCOPIC CHOLECYSTECTOMY ;  Surgeon: Manus RuddMatthew Tsuei, MD;  Location: [facility];  Service: General;  Laterality: N/A;  . DILATION AND EVACUATION  12/03/2010   Procedure: DILATATION AND EVACUATION (D&E);  Surgeon: Roseanna RainbowLisa A Jackson-Moore, MD;  Location: [facility];  Service: Gynecology;  Laterality: N/A;  . OVARIAN CYST REMOVAL    . TONSILLECTOMY       Current Outpatient Medications:  .  cyclobenzaprine (FLEXERIL) 5 MG tablet, TAKE 1 TABLET BY MOUTH THREE TIMES DAILY AS NEEDED FOR MUSCLE SPASMS, Disp: 30 tablet, Rfl: 0 .  ibuprofen (ADVIL,MOTRIN) 600 MG tablet, Take 1 tablet (600 mg total) by mouth every 6 (six) hours., Disp: 30 tablet, Rfl: 0 .  norethindrone (MICRONOR,CAMILA,ERRIN) 0.35 MG tablet, Take 1 tablet (0.35 mg total) by mouth daily., Disp: 1 Package, Rfl: 11 .  carvedilol (COREG) 6.25 MG tablet, Take 1 tablet (  6.25 mg total) by mouth 2 (two) times daily with a meal., Disp: 60 tablet, Rfl: 5 .  doxylamine, Sleep, (UNISOM) 25 MG tablet, Take 1 tablet (25 mg total) by mouth 2 (two) times daily at 8 am and 10 pm. (Patient not taking: Reported on 09/28/2017), Disp: 60 tablet, Rfl: 6 .  metoCLOPramide (REGLAN) 10 MG tablet, TAKE 1 TABLET BY MOUTH EVERY 8 HOURS AS NEEDED FOR NAUSEA AND VOMITING (Patient not taking: Reported on 09/28/2017), Disp: 30 tablet, Rfl: 5 .  pantoprazole (PROTONIX) 40 MG tablet, Take 1 tablet (40 mg total) by mouth daily. (Patient not taking: Reported on 08/24/2017), Disp: 30 tablet, Rfl: 3 .  triamterene-hydrochlorothiazide (DYAZIDE) 37.5-25 MG capsule, Take 1 each (1 capsule total) by mouth daily before breakfast., Disp: 30 capsule, Rfl: 5 Allergies  Allergen Reactions  . Shellfish Allergy Anaphylaxis  . Latex Itching    Social History    Tobacco Use  . Smoking status: Never Smoker  . Smokeless tobacco: Never Used  Substance Use Topics  . Alcohol use: No    Alcohol/week: 0.0 oz    Comment: Ocassionally    Family History  Problem Relation Age of Onset  . Hypertension Other   . Diabetes Other   . Stroke Other   . Cancer Other   . Heart failure Other   . Seizures Other       Review of Systems  Constitutional: negative for fatigue and weight loss Respiratory: negative for cough and wheezing Cardiovascular: negative for chest pain, fatigue and palpitations Gastrointestinal: negative for abdominal pain and change in bowel habits Musculoskeletal:negative for myalgias Neurological: negative for gait problems and tremors Behavioral/Psych: negative for abusive relationship.  POSITIVE for depression Endocrine: negative for temperature intolerance    Genitourinary:negative for abnormal menstrual periods, genital lesions, hot flashes, sexual problems and vaginal discharge Integument/breast: negative for breast lump, breast tenderness, nipple discharge and skin lesion(s)    Objective:       BP (!) 143/101   Pulse 73   Ht 5\' 5"  (1.651 m)   Wt 185 lb (83.9 kg)   Breastfeeding? Yes   BMI 30.79 kg/m  General:   alert and no distress  Skin:   no rash or abnormalities  Lungs:   clear to auscultation bilaterally  Heart:   regular rate and rhythm, S1, S2 normal, no murmur, click, rub or gallop  Breasts:   normal without suspicious masses, skin or nipple changes or axillary nodes  Abdomen:  normal findings: no organomegaly, soft, non-tender and no hernia  Pelvis:  External genitalia: normal general appearance Urinary system: urethral meatus normal and bladder without fullness, nontender Vaginal: normal without tenderness, induration or masses Cervix: normal appearance Adnexa: normal bimanual exam Uterus: anteverted and non-tender, normal size   Lab Review Urine pregnancy test Labs reviewed yes Radiologic  studies reviewed no  50% of 20 min visit spent on counseling and coordination of care.   Assessment:     1. Encounter for annual routine gynecological examination  2. Screening examination for STD (sexually transmitted disease) Rx: - Hepatitis B surface antigen - Hepatitis C antibody - RPR - HIV antibody  3. Screening for cervical cancer Rx: - Cytology - PAP  4. Vaginal discharge Rx: - Cervicovaginal ancillary only  5. Amenorrhea Rx: - POCT urine pregnancy  6. Postpartum hypertension Rx: - carvedilol (COREG) 6.25 MG tablet; Take 1 tablet (6.25 mg total) by mouth 2 (two) times daily with a meal.  Dispense: 60 tablet; Refill: 5 - triamterene-hydrochlorothiazide (  DYAZIDE) 37.5-25 MG capsule; Take 1 each (1 capsule total) by mouth daily before breakfast.  Dispense: 30 capsule; Refill: 5  7. Iron deficiency anemia secondary to inadequate dietary iron intake Rx: - CBC - Ferritin - CitraNatal Bloom Rx     Plan:    Education reviewed: calcium supplements, depression evaluation, low fat, low cholesterol diet, safe sex/STD prevention, self breast exams and weight bearing exercise. Contraception: oral progesterone-only contraceptive. Follow up in: 2 weeks.  BP Check.  Meds ordered this encounter  Medications  . carvedilol (COREG) 6.25 MG tablet    Sig: Take 1 tablet (6.25 mg total) by mouth 2 (two) times daily with a meal.    Dispense:  60 tablet    Refill:  5  . triamterene-hydrochlorothiazide (DYAZIDE) 37.5-25 MG capsule    Sig: Take 1 each (1 capsule total) by mouth daily before breakfast.    Dispense:  30 capsule    Refill:  5   Orders Placed This Encounter  Procedures  . Hepatitis B surface antigen  . Hepatitis C antibody  . RPR  . HIV antibody  . POCT urine pregnancy    Brock Bad MD 11-02-2017

## 2017-11-03 LAB — CYTOLOGY - PAP
DIAGNOSIS: NEGATIVE
HPV: NOT DETECTED

## 2017-11-03 LAB — CERVICOVAGINAL ANCILLARY ONLY
BACTERIAL VAGINITIS: NEGATIVE
Candida vaginitis: NEGATIVE
Chlamydia: NEGATIVE
Neisseria Gonorrhea: NEGATIVE
TRICH (WINDOWPATH): NEGATIVE

## 2017-11-04 LAB — CBC
HEMATOCRIT: 42.5 % (ref 34.0–46.6)
Hemoglobin: 13.7 g/dL (ref 11.1–15.9)
MCH: 28.4 pg (ref 26.6–33.0)
MCHC: 32.2 g/dL (ref 31.5–35.7)
MCV: 88 fL (ref 79–97)
Platelets: 293 10*3/uL (ref 150–450)
RBC: 4.82 x10E6/uL (ref 3.77–5.28)
RDW: 16.4 % — AB (ref 12.3–15.4)
WBC: 7.3 10*3/uL (ref 3.4–10.8)

## 2017-11-04 LAB — HEPATITIS C ANTIBODY: Hep C Virus Ab: <0.1 s/co ratio (ref 0.0–0.9)

## 2017-11-04 LAB — FERRITIN: Ferritin: 23 ng/mL (ref 15–150)

## 2017-11-04 LAB — HIV ANTIBODY (ROUTINE TESTING W REFLEX): HIV SCREEN 4TH GENERATION: NONREACTIVE

## 2017-11-04 LAB — RPR: RPR Ser Ql: NONREACTIVE

## 2017-11-04 LAB — HEPATITIS B SURFACE ANTIGEN: HEP B S AG: NEGATIVE

## 2017-11-16 ENCOUNTER — Ambulatory Visit: Payer: Medicaid Other | Admitting: *Deleted

## 2017-11-16 VITALS — BP 120/84 | HR 73

## 2017-11-16 DIAGNOSIS — O165 Unspecified maternal hypertension, complicating the puerperium: Secondary | ICD-10-CM

## 2017-11-16 NOTE — Progress Notes (Signed)
Subjective:  Carla [name] is a 27 y.o. female here for BP check.   Hypertension ROS: taking medications as instructed, no medication side effects noted, no TIA's, no chest pain on exertion, no dyspnea on exertion and no swelling of ankles.    Objective:  BP 120/84   Pulse 73   Appearance alert, well appearing, and in no distress. General exam BP noted to be well controlled today in office.    Assessment:   Blood Pressure stable.   Plan:  Current treatment plan is effective, no change in therapy.  Pt to follow up with PCP regarding BP and medication management. Follow up in our office as needed or in 1 year.

## 2018-12-31 IMAGING — US US MFM OB FOLLOW-UP
1 series · 14 of 28 positions shown · non-contrast
Comparison: none

[Series 1: us mfm ob follow-up · 14 of 33 slices shown]
[im 2/33]
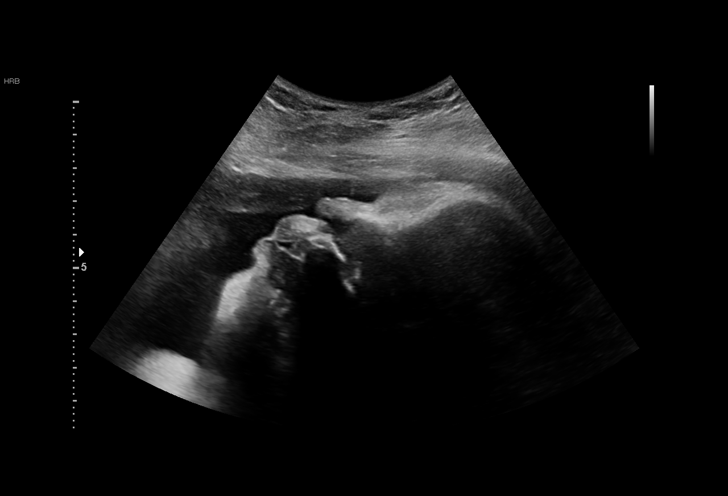
[im 4/33]
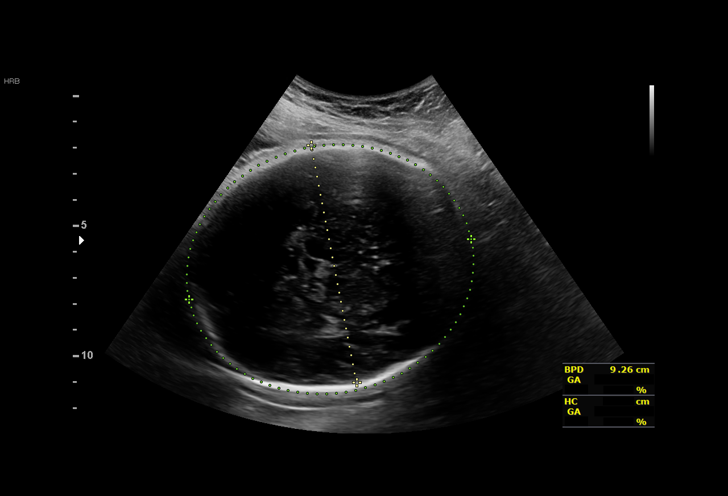
[im 6/33]
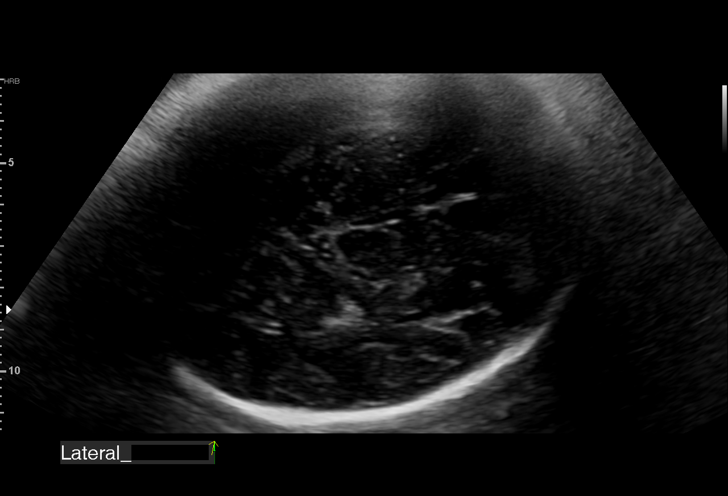
[im 9/33]
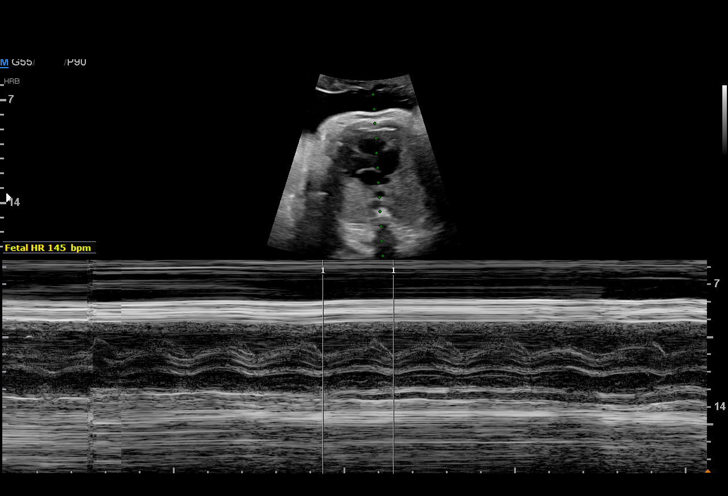
[im 11/33]
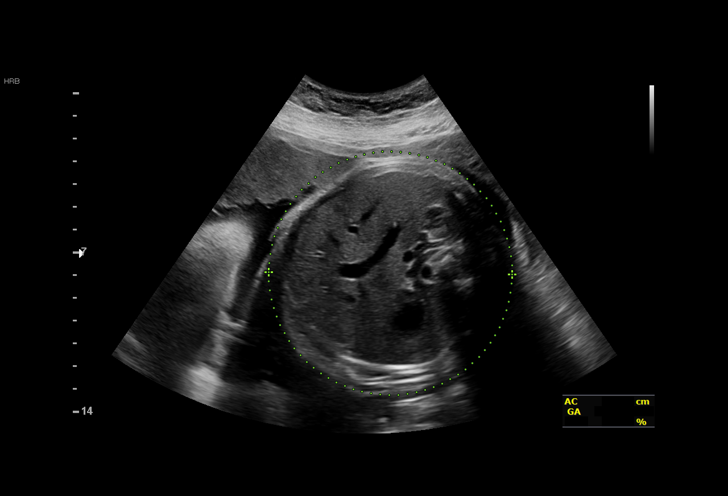
[im 14/33]
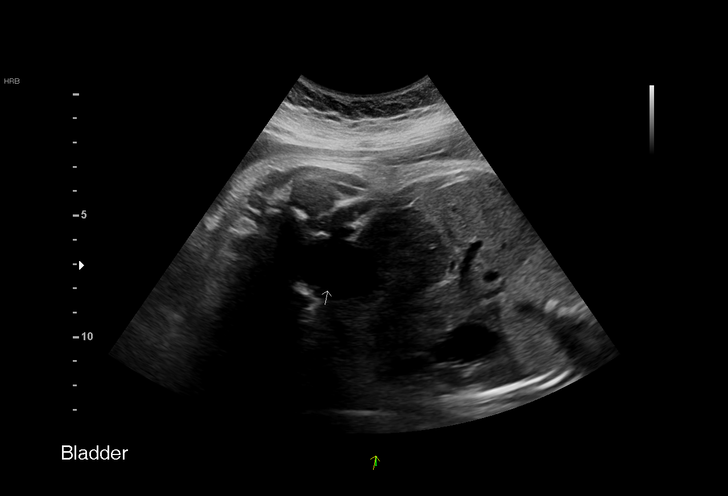
[im 16/33]
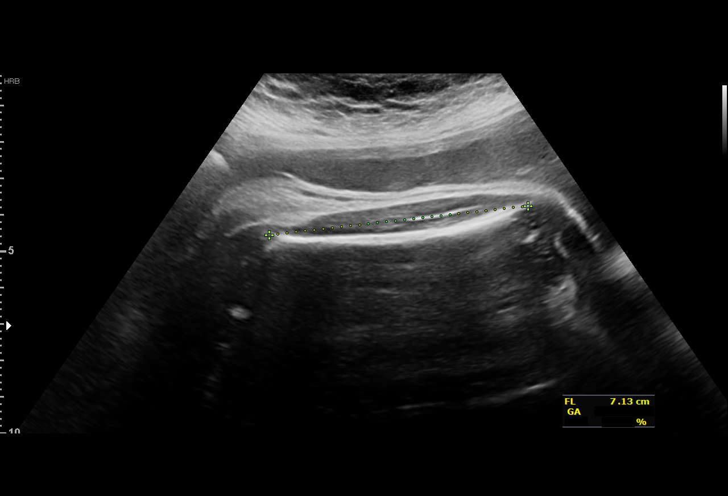
[im 18/33]
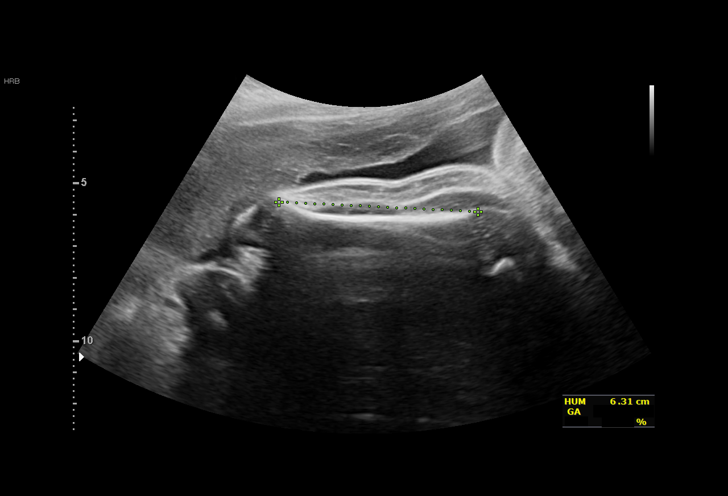
[im 21/33]
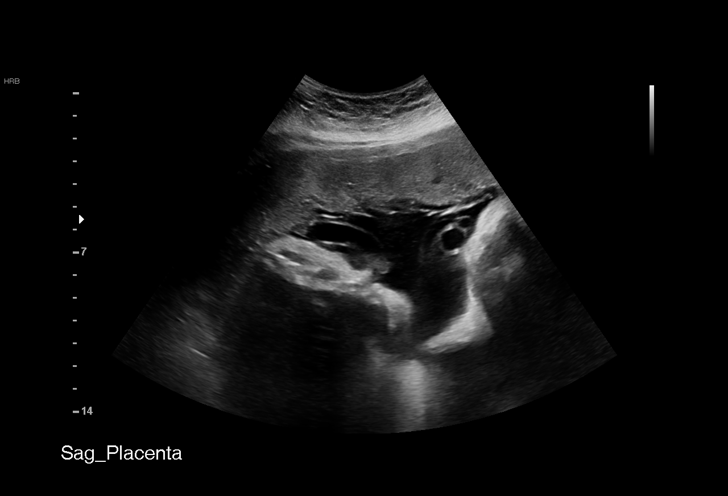
[im 23/33]
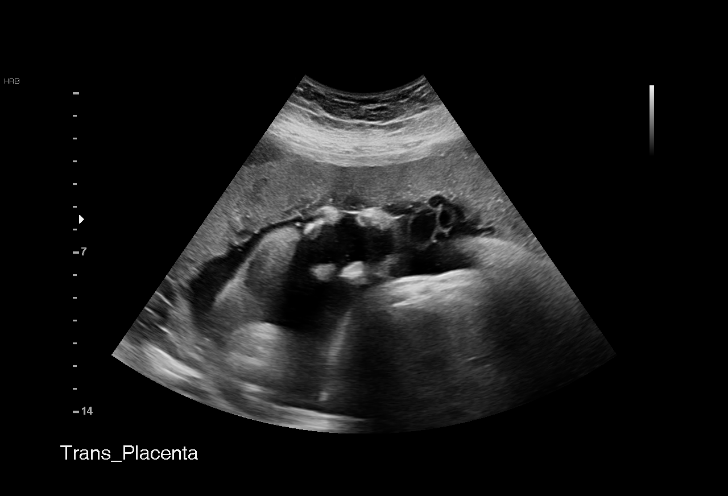
[im 25/33]
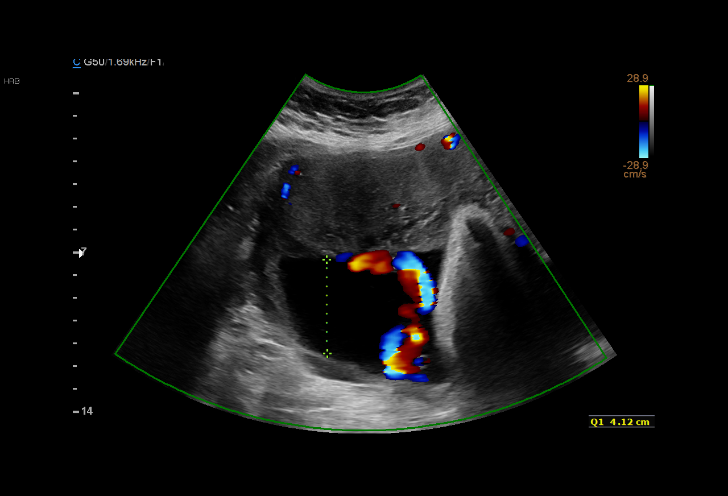
[im 28/33]
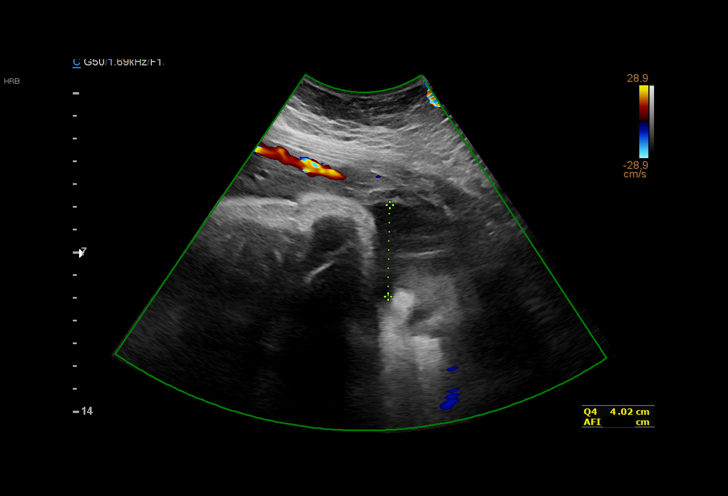
[im 30/33]
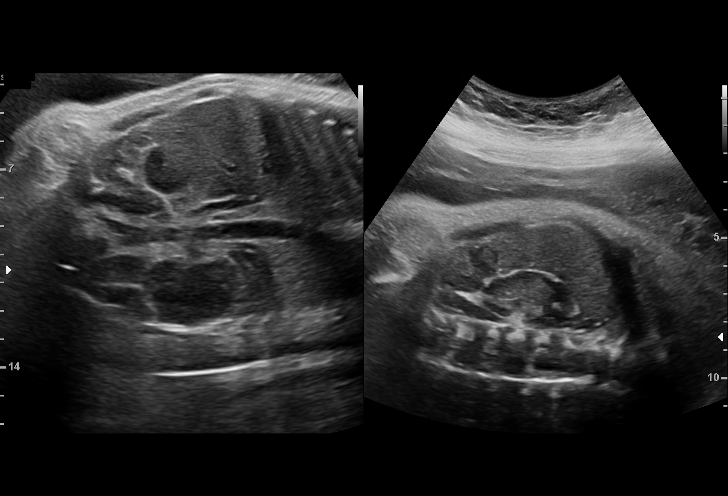
[im 33/33]
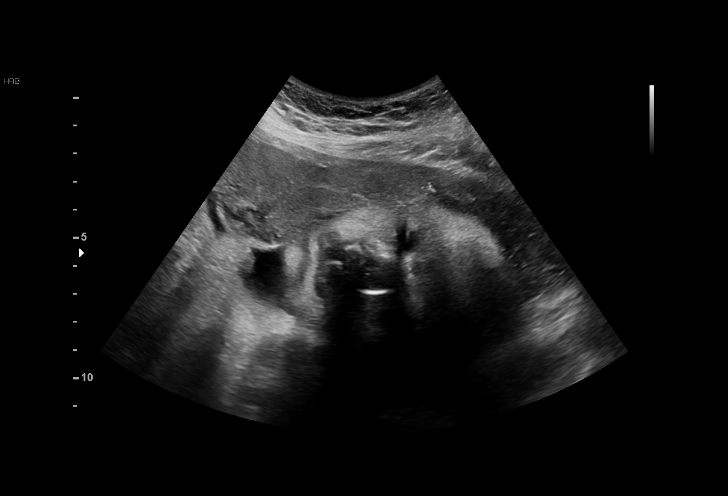

[14 of 28 positions shown; findings below may reference images not displayed]

1  [facility]EX                [id number]      [id number]     [id number]
Indications

36 weeks gestation of pregnancy
Encounter for other antenatal screening
follow-up
Obesity complicating pregnancy, third
trimester
OB History

Blood Type:            Height:  5'4"   Weight (lb):  193       BMI:
Gravidity:    5         Term:   1         SAB:   3
Living:       1
Fetal Evaluation

Num Of Fetuses:     1
Fetal Heart         145
Rate(bpm):
Cardiac Activity:   Observed
Presentation:       Cephalic
Placenta:           Anterior, above cervical os
P. Cord Insertion:  Previously Visualized

Amniotic Fluid
AFI FV:      Subjectively within normal limits

AFI Sum(cm)     %Tile       Largest Pocket(cm)
15.26           57

RUQ(cm)       RLQ(cm)       LUQ(cm)        LLQ(cm)
4.12
Biometry

BPD:      92.1  mm     G. Age:  37w 3d         82  %    CI:        80.11   %    70 - 86
FL/HC:      21.8   %    20.8 -
HC:      325.1  mm     G. Age:  36w 6d         28  %    HC/AC:      0.97        0.92 -
AC:      334.2  mm     G. Age:  37w 2d         80  %    FL/BPD:     77.0   %    71 - 87
FL:       70.9  mm     G. Age:  36w 2d         42  %    FL/AC:      21.2   %    20 - 24
HUM:      63.1  mm     G. Age:  36w 5d         67  %

Est. FW:    4747  gm    6 lb 13 oz      76  %
Gestational Age

LMP:           39w 3d        Date:  11/13/16                 EDD:   08/20/17
U/S Today:     37w 0d                                        EDD:   09/06/17
Best:          36w 4d     Det. By:  Early Ultrasound         EDD:   09/09/17
(01/13/17)
Anatomy

Cranium:               Appears normal         Aortic Arch:            Previously seen
Cavum:                 Appears normal         Ductal Arch:            Previously seen
Ventricles:            Appears normal         Diaphragm:              Previously seen
Choroid Plexus:        Previously seen        Stomach:                Appears normal, left
sided
Cerebellum:            Previously seen        Abdomen:                Appears normal
Posterior Fossa:       Previously seen        Abdominal Wall:         Previously seen
Nuchal Fold:           Previously seen        Cord Vessels:           Previously seen
Face:                  Orbits and profile     Kidneys:                Appear normal
previously seen
Lips:                  Previously seen        Bladder:                Appears normal
Thoracic:              Appears normal         Spine:                  Ltd views no
intracranial signs of
NT
Heart:                 Previously seen        Upper Extremities:      Previously seen
RVOT:                  Previously seen        Lower Extremities:      Previously seen
LVOT:                  Previously seen

Other:  Fetus appears to be a female. Heels previously visualized. Nasal
bone previously visualized. Technically difficult due to maternal
habitus and fetal position.
Cervix Uterus Adnexa

Cervix
Not visualized (advanced GA >01wks)
Impression

Singleton intrauterine pregnancy at 36+4 weeks with obesity
here for growth evaluation
Interval review of the anatomy shows no sonographic
markers for aneuploidy or structural anomalies
All relevant fetal anatomy has been visualized
Amniotic fluid volume is normal
Estimated fetal weight shows growth in the 76th percentile
Recommendations

Follow-up ultrasounds as clinically indicated.

## 2019-01-10 ENCOUNTER — Ambulatory Visit (INDEPENDENT_AMBULATORY_CARE_PROVIDER_SITE_OTHER): Payer: 59

## 2019-01-10 ENCOUNTER — Other Ambulatory Visit: Payer: Self-pay

## 2019-01-10 VITALS — BP 141/83 | HR 77 | Wt 214.9 lb

## 2019-01-10 DIAGNOSIS — O219 Vomiting of pregnancy, unspecified: Secondary | ICD-10-CM

## 2019-01-10 DIAGNOSIS — Z3201 Encounter for pregnancy test, result positive: Secondary | ICD-10-CM

## 2019-01-10 DIAGNOSIS — Z3200 Encounter for pregnancy test, result unknown: Secondary | ICD-10-CM

## 2019-01-10 LAB — POCT URINE PREGNANCY: Preg Test, Ur: POSITIVE — AB

## 2019-01-10 MED ORDER — DOXYLAMINE-PYRIDOXINE 10-10 MG PO TBEC: 2.0000 | DELAYED_RELEASE_TABLET | Freq: Every day | ORAL | 5 refills | Status: DC

## 2019-01-10 NOTE — Progress Notes (Signed)
Pt is here for UPT. UPT is positive. LMP 11/26/18. EDD 09/02/19. Pt advised to make new OB appt. Pt verbalizes she has had some nausea and would like to know if she can have medication. I advised pt I can send Diclegis for her to see if that helps, pt instructed to let us know if this does not improve her symptoms. Pt verbalizes understanding.

## 2019-01-14 ENCOUNTER — Other Ambulatory Visit: Payer: Self-pay | Admitting: Obstetrics

## 2019-01-14 DIAGNOSIS — O210 Mild hyperemesis gravidarum: Secondary | ICD-10-CM

## 2019-01-14 MED ORDER — METOCLOPRAMIDE HCL 10 MG PO TABS: ORAL_TABLET | ORAL | 5 refills | Status: DC

## 2019-01-17 ENCOUNTER — Inpatient Hospital Stay (HOSPITAL_COMMUNITY)
Admission: AD | Admit: 2019-01-17 | Discharge: 2019-01-17 | Disposition: A | Discharge status: Home / Self Care | Payer: 59 | Attending: Obstetrics and Gynecology | Admitting: Obstetrics and Gynecology

## 2019-01-17 ENCOUNTER — Encounter (HOSPITAL_COMMUNITY): Payer: Self-pay | Admitting: Student

## 2019-01-17 ENCOUNTER — Other Ambulatory Visit: Payer: Self-pay

## 2019-01-17 ENCOUNTER — Inpatient Hospital Stay (HOSPITAL_COMMUNITY): Payer: 59

## 2019-01-17 DIAGNOSIS — Z91040 Latex allergy status: Secondary | ICD-10-CM | POA: Diagnosis not present

## 2019-01-17 DIAGNOSIS — Z9049 Acquired absence of other specified parts of digestive tract: Secondary | ICD-10-CM | POA: Insufficient documentation

## 2019-01-17 DIAGNOSIS — O9989 Other specified diseases and conditions complicating pregnancy, childbirth and the puerperium: Secondary | ICD-10-CM | POA: Insufficient documentation

## 2019-01-17 DIAGNOSIS — Z3A01 Less than 8 weeks gestation of pregnancy: Secondary | ICD-10-CM

## 2019-01-17 DIAGNOSIS — Z3491 Encounter for supervision of normal pregnancy, unspecified, first trimester: Secondary | ICD-10-CM

## 2019-01-17 DIAGNOSIS — O209 Hemorrhage in early pregnancy, unspecified: Secondary | ICD-10-CM | POA: Diagnosis not present

## 2019-01-17 DIAGNOSIS — Z809 Family history of malignant neoplasm, unspecified: Secondary | ICD-10-CM | POA: Insufficient documentation

## 2019-01-17 DIAGNOSIS — O26891 Other specified pregnancy related conditions, first trimester: Secondary | ICD-10-CM | POA: Diagnosis not present

## 2019-01-17 DIAGNOSIS — O219 Vomiting of pregnancy, unspecified: Secondary | ICD-10-CM | POA: Diagnosis not present

## 2019-01-17 DIAGNOSIS — Z833 Family history of diabetes mellitus: Secondary | ICD-10-CM | POA: Insufficient documentation

## 2019-01-17 DIAGNOSIS — Z91013 Allergy to seafood: Secondary | ICD-10-CM | POA: Diagnosis not present

## 2019-01-17 DIAGNOSIS — Z79899 Other long term (current) drug therapy: Secondary | ICD-10-CM | POA: Diagnosis not present

## 2019-01-17 DIAGNOSIS — R109 Unspecified abdominal pain: Secondary | ICD-10-CM | POA: Diagnosis not present

## 2019-01-17 DIAGNOSIS — R1032 Left lower quadrant pain: Secondary | ICD-10-CM | POA: Diagnosis not present

## 2019-01-17 LAB — CBC
HCT: 38 % (ref 36.0–46.0)
Hemoglobin: 12.5 g/dL (ref 12.0–15.0)
MCH: 28.9 pg (ref 26.0–34.0)
MCHC: 32.9 g/dL (ref 30.0–36.0)
MCV: 88 fL (ref 80.0–100.0)
Platelets: 273 10*3/uL (ref 150–400)
RBC: 4.32 MIL/uL (ref 3.87–5.11)
RDW: 14 % (ref 11.5–15.5)
WBC: 10.9 10*3/uL — ABNORMAL HIGH (ref 4.0–10.5)
nRBC: 0 % (ref 0.0–0.2)

## 2019-01-17 LAB — WET PREP, GENITAL
Clue Cells Wet Prep HPF POC: NONE SEEN
Sperm: NONE SEEN
Trich, Wet Prep: NONE SEEN
Yeast Wet Prep HPF POC: NONE SEEN

## 2019-01-17 LAB — URINALYSIS, ROUTINE W REFLEX MICROSCOPIC
Bilirubin Urine: NEGATIVE
Glucose, UA: NEGATIVE mg/dL
Ketones, ur: NEGATIVE mg/dL
Leukocytes,Ua: NEGATIVE
Nitrite: NEGATIVE
Protein, ur: 30 mg/dL — AB
Specific Gravity, Urine: 1.031 — ABNORMAL HIGH (ref 1.005–1.030)
pH: 6 (ref 5.0–8.0)

## 2019-01-17 LAB — HCG, QUANTITATIVE, PREGNANCY: hCG, Beta Chain, Quant, S: 127331 m[IU]/mL — ABNORMAL HIGH (ref <5)

## 2019-01-17 NOTE — MAU Provider Note (Signed)
Chief Complaint: Vaginal Bleeding and Abdominal Pain   First Provider Initiated Contact with Patient 01/17/19 1858     SUBJECTIVE HPI: Carla [name] is a 28 y.o. [id number] at 7129w3d who presents to [facility] reporting abdominal pain & vaginal bleeding. Has had red & brown spotting for the last 3 days ago. Started having sharp intermittent pains in her LLQ today. Has been having n/v which is not new. Denies fever/chills, diarrhea, constipation, dysuria, recent intercourse, or vaginal discharge.   Location: LLQ abdomen Quality: sharp Severity: 8/10 on pain scale Duration: <1 day Timing: intermittent Modifying factors: none Associated signs and symptoms: vaginal bleeding  Past Medical History:  Diagnosis Date  . Allergy   . Anemia   . Depression   . Headache(784.0)   . PONV (postoperative nausea and vomiting)   . Spontaneous abortion 12/03/2010   OB History  Gravida Para Term Preterm AB Living  8 2 2   5 2   SAB TAB Ectopic Multiple Live Births  5     0 2    # Outcome Date GA Lbr Len/2nd Weight Sex Delivery Anes PTL Lv  8 Current           7 Term 08/25/17 143w6d 01:09 / 00:27 2951 g F Vag-Spont EPI  LIV     Birth Comments: None  6 Term 05/25/13 341w5d 11:01 / 01:14 3085 g F Vag-Spont EPI  LIV  5 SAB           4 SAB           3 SAB           2 SAB           1 SAB             Obstetric Comments  Patient has had multiple miscarriages- she has had 2 recent SAB. The most recent is reported 1 1/2 months ago.  Pt states she may have had up to 8 pregnancies, has had multiple SAB- lost count.   Past Surgical History:  Procedure Laterality Date  . CHOLECYSTECTOMY N/A 05/13/2015   Procedure: LAPAROSCOPIC CHOLECYSTECTOMY ;  Surgeon: Manus RuddMatthew Tsuei, MD;  Location: [facility] OR;  Service: General;  Laterality: N/A;  . DILATION AND EVACUATION  12/03/2010   Procedure: DILATATION AND EVACUATION (D&E);  Surgeon: Roseanna RainbowLisa A Jackson-Moore, MD;  Location: [facility];  Service: Gynecology;  Laterality:  N/A;  . OVARIAN CYST REMOVAL    . TONSILLECTOMY     Social History   Socioeconomic History  . Marital status: Married    Spouse name: Not on file  . Number of children: Not on file  . Years of education: Not on file  . Highest education level: Not on file  Occupational History  . Not on file  Social Needs  . Financial resource strain: Not on file  . Food insecurity    Worry: Not on file    Inability: Not on file  . Transportation needs    Medical: Not on file    Non-medical: Not on file  Tobacco Use  . Smoking status: Never Smoker  . Smokeless tobacco: Never Used  Substance and Sexual Activity  . Alcohol use: No    Alcohol/week: 0.0 standard drinks    Comment: Ocassionally  . Drug use: No  . Sexual activity: Yes    Partners: Male    Birth control/protection: None  Lifestyle  . Physical activity    Days per week: Not on file  Minutes per session: Not on file  . Stress: Not on file  Relationships  . Social Musicianconnections    Talks on phone: Not on file    Gets together: Not on file    Attends religious service: Not on file    Active member of club or organization: Not on file    Attends meetings of clubs or organizations: Not on file    Relationship status: Not on file  . Intimate partner violence    Fear of current or ex partner: Not on file    Emotionally abused: Not on file    Physically abused: Not on file    Forced sexual activity: Not on file  Other Topics Concern  . Not on file  Social History Narrative  . Not on file   Family History  Problem Relation Age of Onset  . Hypertension Other   . Diabetes Other   . Stroke Other   . Cancer Other   . Heart failure Other   . Seizures Other    No current facility-administered medications on file prior to encounter.    Current Outpatient Medications on File Prior to Encounter  Medication Sig Dispense Refill  . doxylamine, Sleep, (UNISOM) 25 MG tablet Take 1 tablet (25 mg total) by mouth 2 (two) times daily  at 8 am and 10 pm. 60 tablet 6  . Doxylamine-Pyridoxine (DICLEGIS) 10-10 MG TBEC Take 2 tablets by mouth at bedtime. If symptoms persist, add one tablet in the morning and one in the afternoon 100 tablet 5  . metoCLOPramide (REGLAN) 10 MG tablet TAKE 1 TABLET BY MOUTH EVERY 8 HOURS AS NEEDED FOR NAUSEA AND VOMITING 30 tablet 5  . carvedilol (COREG) 6.25 MG tablet Take 1 tablet (6.25 mg total) by mouth 2 (two) times daily with a meal. 60 tablet 5  . cyclobenzaprine (FLEXERIL) 5 MG tablet TAKE 1 TABLET BY MOUTH THREE TIMES DAILY AS NEEDED FOR MUSCLE SPASMS 30 tablet 0  . ibuprofen (ADVIL,MOTRIN) 600 MG tablet Take 1 tablet (600 mg total) by mouth every 6 (six) hours. 30 tablet 0  . norethindrone (MICRONOR,CAMILA,ERRIN) 0.35 MG tablet Take 1 tablet (0.35 mg total) by mouth daily. 1 Package 11  . pantoprazole (PROTONIX) 40 MG tablet Take 1 tablet (40 mg total) by mouth daily. (Patient not taking: Reported on 08/24/2017) 30 tablet 3  . Prenatal-DSS-FeCb-FeGl-FA (CITRANATAL BLOOM) 90-1 MG TABS Take 1 tablet by mouth daily before breakfast. 30 tablet 11  . triamterene-hydrochlorothiazide (DYAZIDE) 37.5-25 MG capsule Take 1 each (1 capsule total) by mouth daily before breakfast. 30 capsule 5   Allergies  Allergen Reactions  . Shellfish Allergy Anaphylaxis  . Latex Itching    I have reviewed patient's Past Medical Hx, Surgical Hx, Family Hx, Social Hx, medications and allergies.   Review of Systems  Constitutional: Negative.   Gastrointestinal: Positive for abdominal pain, nausea and vomiting. Negative for constipation and diarrhea.  Genitourinary: Positive for vaginal bleeding. Negative for dysuria and vaginal discharge.    OBJECTIVE Patient Vitals for the past 24 hrs:  BP Temp Temp src Pulse Resp SpO2 Height Weight  01/17/19 2120 132/81 - - 71 19 - - -  01/17/19 1851 (!) 139/93 - - 70 - - - -  01/17/19 1749 (!) 141/82 (!) 97.5 F (36.4 C) Oral 68 18 100 % 5\' 4"  (1.626 m) 97.3 kg    Constitutional: Well-developed, well-nourished female in no acute distress.  Cardiovascular: normal rate & rhythm, no murmur Respiratory: normal rate and  effort. Lung sounds clear throughout GI: Abd soft, non-tender, Pos BS x 4. No guarding or rebound tenderness MS: Extremities nontender, no edema, normal ROM Neurologic: Alert and oriented x 4.  GU:     SPECULUM EXAM: NEFG, physiologic discharge, no blood noted, cervix clean  BIMANUAL: No CMT. cervix closed; uterus normal size, no adnexal tenderness or masses.    LAB RESULTS Results for orders placed or performed during the hospital encounter of 01/17/19 (from the past 24 hour(s))  Urinalysis, Routine w reflex microscopic     Status: Abnormal   Collection Time: 01/17/19  7:20 PM  Result Value Ref Range   Color, Urine YELLOW YELLOW   APPearance CLEAR CLEAR   Specific Gravity, Urine 1.031 (H) 1.005 - 1.030   pH 6.0 5.0 - 8.0   Glucose, UA NEGATIVE NEGATIVE mg/dL   Hgb urine dipstick SMALL (A) NEGATIVE   Bilirubin Urine NEGATIVE NEGATIVE   Ketones, ur NEGATIVE NEGATIVE mg/dL   Protein, ur 30 (A) NEGATIVE mg/dL   Nitrite NEGATIVE NEGATIVE   Leukocytes,Ua NEGATIVE NEGATIVE   RBC / HPF 0-5 0 - 5 RBC/hpf   WBC, UA 0-5 0 - 5 WBC/hpf   Bacteria, UA RARE (A) NONE SEEN   Squamous Epithelial / LPF 0-5 0 - 5   Mucus PRESENT   Wet prep, genital     Status: Abnormal   Collection Time: 01/17/19  7:56 PM  Result Value Ref Range   Yeast Wet Prep HPF POC NONE SEEN NONE SEEN   Trich, Wet Prep NONE SEEN NONE SEEN   Clue Cells Wet Prep HPF POC NONE SEEN NONE SEEN   WBC, Wet Prep HPF POC MANY (A) NONE SEEN   Sperm NONE SEEN   CBC     Status: Abnormal   Collection Time: 01/17/19  8:15 PM  Result Value Ref Range   WBC 10.9 (H) 4.0 - 10.5 K/uL   RBC 4.32 3.87 - 5.11 MIL/uL   Hemoglobin 12.5 12.0 - 15.0 g/dL   HCT 38.0 36.0 - 46.0 %   MCV 88.0 80.0 - 100.0 fL   MCH 28.9 26.0 - 34.0 pg   MCHC 32.9 30.0 - 36.0 g/dL   RDW 14.0 11.5 - 15.5 %    Platelets 273 150 - 400 K/uL   nRBC 0.0 0.0 - 0.2 %  hCG, quantitative, pregnancy     Status: Abnormal   Collection Time: 01/17/19  8:15 PM  Result Value Ref Range   hCG, Beta Chain, Quant, S 127,331 (H) <5 mIU/mL    IMAGING US Ob Less Than 14 Weeks With Ob Transvaginal  Result Date: 01/17/2019 CLINICAL DATA:  Bleeding, left lower quadrant pain EXAM: OBSTETRIC <14 WK [place] AND TRANSVAGINAL OB US TECHNIQUE: Both transabdominal and transvaginal ultrasound examinations were performed for complete evaluation of the gestation as well as the maternal uterus, adnexal regions, and pelvic cul-de-sac. Transvaginal technique was performed to assess early pregnancy. COMPARISON:  None. FINDINGS: Intrauterine gestational sac: Single Yolk sac:  Visualized Embryo:  Visualized Cardiac Activity: Visualized Heart Rate: 142 bpm MSD:   mm    w     d CRL:  8.8 mm   6 w   5 d                  [place] EDC: 09/07/2019 Subchorionic hemorrhage:  None visualized. Maternal uterus/adnexae: No adnexal mass.  Trace free fluid. IMPRESSION: 6 week 5 day intrauterine pregnancy. Fetal heart rate 142 beats per minute. No acute maternal findings.  Electronically Signed   By: Charlett Nose M.D.   On: 01/17/2019 20:58    MAU COURSE Orders Placed This Encounter  Procedures  . Wet prep, genital  . US OB LESS THAN 14 WEEKS WITH OB TRANSVAGINAL  . Urinalysis, Routine w reflex microscopic  . CBC  . hCG, quantitative, pregnancy  . Discharge patient   No orders of the defined types were placed in this encounter.   MDM +UPT UA, wet prep, GC/chlamydia, CBC, ABO/Rh, quant hCG, and [place] today to rule out ectopic pregnancy  RH positive  Ultrasound shows live IUP. No adnexal mass or ovarian cysts.   Pt with elevated BPs. Hx of GHTN but denies CHTN. Has new ob scheduled at [facility], will reassess BP at that time.   ASSESSMENT 1. Normal IUP (intrauterine pregnancy) on prenatal ultrasound, first trimester   2. Vaginal bleeding in pregnancy, first  trimester   3. Abdominal pain during pregnancy in first trimester     PLAN Discharge home in stable condition. Discussed reasons to return to MAU GC/CT pending Start prenatal care  Follow-up Information    [facility] Follow up.   Specialty: Obstetrics and Gynecology Why: return for worsening symptoms Contact information: [address] [id number] mc [place] [address] [phone]         Allergies as of 01/17/2019      Reactions   Shellfish Allergy Anaphylaxis   Latex Itching      Medication List    STOP taking these medications   carvedilol 6.25 MG tablet Commonly known as: Coreg   ibuprofen 600 MG tablet Commonly known as: ADVIL   norethindrone 0.35 MG tablet Commonly known as: MICRONOR   pantoprazole 40 MG tablet Commonly known as: Protonix   triamterene-hydrochlorothiazide 37.5-25 MG capsule Commonly known as: Dyazide     TAKE these medications   CitraNatal Bloom 90-1 MG Tabs Take 1 tablet by mouth daily before breakfast.   cyclobenzaprine 5 MG tablet Commonly known as: FLEXERIL TAKE 1 TABLET BY MOUTH THREE TIMES DAILY AS NEEDED FOR MUSCLE SPASMS   doxylamine (Sleep) 25 MG tablet Commonly known as: UNISOM Take 1 tablet (25 mg total) by mouth 2 (two) times daily at 8 am and 10 pm.   Doxylamine-Pyridoxine 10-10 MG Tbec Commonly known as: Diclegis Take 2 tablets by mouth at bedtime. If symptoms persist, add one tablet in the morning and one in the afternoon   metoCLOPramide 10 MG tablet Commonly known as: REGLAN TAKE 1 TABLET BY MOUTH EVERY 8 HOURS AS NEEDED FOR NAUSEA AND VOMITING        Judeth Horn, NP 01/17/2019  9:28 PM

## 2019-01-17 NOTE — Discharge Instructions (Signed)
Return to care   If you have heavier bleeding that soaks through more that 2 pads per hour for an hour or more  If you bleed so much that you feel like you might pass out or you do pass out  If you have significant abdominal pain that is not improved with Tylenol   If you develop a fever > 100.5      Abdominal Pain During Pregnancy  Belly (abdominal) pain is common during pregnancy. There are many possible causes. Most of the time, it is not a serious problem. Other times, it can be a sign that something is wrong with the pregnancy. Always tell your doctor if you have belly pain. Follow these instructions at home:  Do not have sex or put anything in your vagina until your pain goes away completely.  Get plenty of rest until your pain gets better.  Drink enough fluid to keep your pee (urine) pale yellow.  Take over-the-counter and prescription medicines only as told by your doctor.  Keep all follow-up visits as told by your doctor. This is important. Contact a doctor if:  Your pain continues or gets worse after resting.  You have lower belly pain that: ? Comes and goes at regular times. ? Spreads to your back. ? Feels like menstrual cramps.  You have pain or burning when you pee (urinate). Get help right away if:  You have a fever or chills.  You have vaginal bleeding.  You are leaking fluid from your vagina.  You are passing tissue from your vagina.  You throw up (vomit) for more than 24 hours.  You have watery poop (diarrhea) for more than 24 hours.  Your baby is moving less than usual.  You feel very weak or faint.  You have shortness of breath.  You have very bad pain in your upper belly. Summary  Belly (abdominal) pain is common during pregnancy. There are many possible causes.  If you have belly pain during pregnancy, tell your doctor right away.  Keep all follow-up visits as told by your doctor. This is important. This information is not  intended to replace advice given to you by your health care provider. Make sure you discuss any questions you have with your health care provider. Document Released: 04/27/2009 Document Revised: 08/27/2018 Document Reviewed: 08/11/2016 Elsevier Patient Education  2020 [employer].

## 2019-01-17 NOTE — MAU Note (Addendum)
Bleeding the last days, bright pink in the mornings, brownish at night.  Today started having sharp pains, cramping in LLQ. Hx of miscarriages, when the pain started she was scared about an ectopic

## 2019-01-18 LAB — GC/CHLAMYDIA PROBE AMP (~~LOC~~) NOT AT ARMC
Chlamydia: NEGATIVE
Neisseria Gonorrhea: NEGATIVE

## 2019-02-05 ENCOUNTER — Encounter (HOSPITAL_COMMUNITY): Payer: Self-pay

## 2019-02-05 ENCOUNTER — Inpatient Hospital Stay (HOSPITAL_COMMUNITY)
Admission: AD | Admit: 2019-02-05 | Discharge: 2019-02-05 | Disposition: A | Discharge status: Home / Self Care | Payer: 59 | Attending: Obstetrics and Gynecology | Admitting: Obstetrics and Gynecology

## 2019-02-05 ENCOUNTER — Other Ambulatory Visit: Payer: Self-pay

## 2019-02-05 DIAGNOSIS — Z3A10 10 weeks gestation of pregnancy: Secondary | ICD-10-CM | POA: Diagnosis not present

## 2019-02-05 DIAGNOSIS — O219 Vomiting of pregnancy, unspecified: Secondary | ICD-10-CM | POA: Diagnosis not present

## 2019-02-05 DIAGNOSIS — O210 Mild hyperemesis gravidarum: Secondary | ICD-10-CM | POA: Diagnosis not present

## 2019-02-05 DIAGNOSIS — R42 Dizziness and giddiness: Secondary | ICD-10-CM | POA: Diagnosis present

## 2019-02-05 LAB — CBC WITH DIFFERENTIAL/PLATELET
Abs Immature Granulocytes: 0.02 10*3/uL (ref 0.00–0.07)
Basophils Absolute: 0 10*3/uL (ref 0.0–0.1)
Basophils Relative: 0 %
Eosinophils Absolute: 0.1 10*3/uL (ref 0.0–0.5)
Eosinophils Relative: 1 %
HCT: 37.7 % (ref 36.0–46.0)
Hemoglobin: 12.9 g/dL (ref 12.0–15.0)
Immature Granulocytes: 0 %
Lymphocytes Relative: 28 %
Lymphs Abs: 2.5 10*3/uL (ref 0.7–4.0)
MCH: 29.9 pg (ref 26.0–34.0)
MCHC: 34.2 g/dL (ref 30.0–36.0)
MCV: 87.5 fL (ref 80.0–100.0)
Monocytes Absolute: 0.6 10*3/uL (ref 0.1–1.0)
Monocytes Relative: 7 %
Neutro Abs: 5.8 10*3/uL (ref 1.7–7.7)
Neutrophils Relative %: 64 %
Platelets: 276 10*3/uL (ref 150–400)
RBC: 4.31 MIL/uL (ref 3.87–5.11)
RDW: 14 % (ref 11.5–15.5)
WBC: 9.1 10*3/uL (ref 4.0–10.5)
nRBC: 0 % (ref 0.0–0.2)

## 2019-02-05 LAB — COMPREHENSIVE METABOLIC PANEL
ALT: 73 U/L — ABNORMAL HIGH (ref 0–44)
AST: 35 U/L (ref 15–41)
Albumin: 3.7 g/dL (ref 3.5–5.0)
Alkaline Phosphatase: 69 U/L (ref 38–126)
Anion gap: 10 (ref 5–15)
BUN: 7 mg/dL (ref 6–20)
CO2: 24 mmol/L (ref 22–32)
Calcium: 9.2 mg/dL (ref 8.9–10.3)
Chloride: 100 mmol/L (ref 98–111)
Creatinine, Ser: 0.6 mg/dL (ref 0.44–1.00)
GFR calc Af Amer: >60 mL/min (ref >60)
GFR calc non Af Amer: >60 mL/min (ref >60)
Glucose, Bld: 97 mg/dL (ref 70–99)
Potassium: 3.4 mmol/L — ABNORMAL LOW (ref 3.5–5.1)
Sodium: 134 mmol/L — ABNORMAL LOW (ref 135–145)
Total Bilirubin: 0.8 mg/dL (ref 0.3–1.2)
Total Protein: 7 g/dL (ref 6.5–8.1)

## 2019-02-05 LAB — URINALYSIS, ROUTINE W REFLEX MICROSCOPIC
Bacteria, UA: NONE SEEN
Bilirubin Urine: NEGATIVE
Glucose, UA: NEGATIVE mg/dL
Hgb urine dipstick: NEGATIVE
Ketones, ur: 5 mg/dL — AB
Leukocytes,Ua: NEGATIVE
Nitrite: NEGATIVE
Protein, ur: 100 mg/dL — AB
Specific Gravity, Urine: 1.045 — ABNORMAL HIGH (ref 1.005–1.030)
pH: 5 (ref 5.0–8.0)

## 2019-02-05 LAB — GLUCOSE, CAPILLARY: Glucose-Capillary: 96 mg/dL (ref 70–99)

## 2019-02-05 MED ORDER — FAMOTIDINE IN NACL 20-0.9 MG/50ML-% IV SOLN
20.0000 mg | Freq: Once | INTRAVENOUS | Status: AC
  Administered 2019-02-05: 20 mg via INTRAVENOUS
  Filled 2019-02-05: qty 50

## 2019-02-05 MED ORDER — M.V.I. ADULT IV INJ
Freq: Once | INTRAVENOUS | Status: DC
  Administered 2019-02-05: 17:00:00 via INTRAVENOUS
  Filled 2019-02-05: qty 10

## 2019-02-05 MED ORDER — FAMOTIDINE 20 MG PO TABS: 20.0000 mg | ORAL_TABLET | Freq: Two times a day (BID) | ORAL | 0 refills | Status: DC

## 2019-02-05 MED ORDER — LACTATED RINGERS IV BOLUS
1000.0000 mL | Freq: Once | INTRAVENOUS | Status: AC
  Administered 2019-02-05: 1000 mL via INTRAVENOUS

## 2019-02-05 MED ORDER — SCOPOLAMINE 1 MG/3DAYS TD PT72
1.0000 | MEDICATED_PATCH | TRANSDERMAL | Status: DC
  Administered 2019-02-05: 19:00:00 1.5 mg via TRANSDERMAL
  Filled 2019-02-05: qty 1

## 2019-02-05 MED ORDER — SCOPOLAMINE 1 MG/3DAYS TD PT72: 1.0000 | MEDICATED_PATCH | TRANSDERMAL | 12 refills | Status: DC

## 2019-02-05 MED ORDER — M.V.I. ADULT IV INJ
Freq: Once | INTRAVENOUS | Status: DC
  Filled 2019-02-05: qty 10

## 2019-02-05 MED ORDER — METOCLOPRAMIDE HCL 5 MG/ML IJ SOLN
10.0000 mg | Freq: Once | INTRAMUSCULAR | Status: AC
  Administered 2019-02-05: 10 mg via INTRAVENOUS
  Filled 2019-02-05: qty 2

## 2019-02-05 NOTE — Discharge Instructions (Signed)
What are the causes? The cause of this condition is not known. It may be related to changes in chemicals (hormones) in the body during pregnancy, such as the high level of pregnancy hormone (human chorionic gonadotropin) or the increase in the female sex hormone (estrogen). What are the signs or symptoms? Symptoms of this condition include:  Nausea that does not go away.  Vomiting that does not allow you to keep any food down.  Weight loss.  Body fluid loss (dehydration).  Having no desire to eat, or not liking food that you have previously enjoyed. How is this diagnosed? This condition may be diagnosed based on:  A physical exam.  Your medical history.  Your symptoms.  Blood tests.  Urine tests. How is this treated? This condition is managed by controlling symptoms. This may include:  Following an eating plan. This can help lessen nausea and vomiting.  Taking prescription medicines. An eating plan and medicines are often used together to help control symptoms. If medicines do not help relieve nausea and vomiting, you may need to receive fluids through an IV at the hospital. Follow these instructions at home: Eating and drinking   Avoid the following: ? Drinking fluids with meals. Try not to drink anything during the 30 minutes before and after your meals. ? Drinking more than 1 cup of fluid at a time. ? Eating foods that trigger your symptoms. These may include spicy foods, coffee, high-fat foods, very sweet foods, and acidic foods. ? Skipping meals. Nausea can be more intense on an empty stomach. If you cannot tolerate food, do not force it. Try sucking on ice chips or other frozen items and make up for missed calories later. ? Lying down within 2 hours after eating. ? Being exposed to environmental triggers. These may include food smells, smoky rooms, closed spaces, rooms with strong smells, warm or humid places, overly loud and noisy rooms, and rooms with motion or  flickering lights. Try eating meals in a well-ventilated area that is free of strong smells. ? Quick and sudden changes in your movement. ? Taking iron pills and multivitamins that contain iron. If you take prescription iron pills, do not stop taking them unless your health care provider approves. ? Preparing food. The smell of food can spoil your appetite or trigger nausea.  To help relieve your symptoms: ? Listen to your body. Everyone is different and has different preferences. Find what works best for you. ? Eat and drink slowly. ? Eat 5-6 small meals daily instead of 3 large meals. Eating small meals and snacks can help you avoid an empty stomach. ? In the morning, before getting out of bed, eat a couple of crackers to avoid moving around on an empty stomach. ? Try eating starchy foods as these are usually tolerated well. Examples include cereal, toast, bread, potatoes, pasta, rice, and pretzels. ? Include at least 1 serving of protein with your meals and snacks. Protein options include lean meats, poultry, seafood, beans, nuts, nut butters, eggs, cheese, and yogurt. ? Try eating a protein-rich snack before bed. Examples of a protein-rick snack include cheese and crackers or a peanut butter sandwich made with 1 slice of whole-wheat bread and 1 tsp (5 g) of peanut butter. ? Eat or suck on things that have ginger in them. It may help relieve nausea. Add  tsp ground ginger to hot tea or choose ginger tea. ? Try drinking 100% fruit juice or an electrolyte drink. An electrolyte drink contains sodium,  potassium, and chloride. ? Drink fluids that are cold, clear, and carbonated or sour. Examples include lemonade, ginger ale, lemon-lime soda, ice water, and sparkling water. ? Brush your teeth or use a mouth rinse after meals. ? Talk with your health care provider about starting a supplement of vitamin B6. General instructions  Take over-the-counter and prescription medicines only as told by your  health care provider.  Follow instructions from your health care provider about eating or drinking restrictions.  Continue to take your prenatal vitamins as told by your health care provider. If you are having trouble taking your prenatal vitamins, talk with your health care provider about different options.  Keep all follow-up and pre-birth (prenatal) visits as told by your health care provider. This is important. Contact a health care provider if:  You have pain in your abdomen.  You have a severe headache.  You have vision problems.  You are losing weight.  You feel weak or dizzy. Get help right away if:  You cannot drink fluids without vomiting.  You vomit blood.  You have constant nausea and vomiting.  You are very weak.  You faint.  You have a fever and your symptoms suddenly get worse. Summary   Making some changes to your eating habits may help relieve nausea and vomiting.  This condition may be managed with medicine.  If medicines do not help relieve nausea and vomiting, you may need to receive fluids through an IV at the hospital. This information is not intended to replace advice given to you by your health care provider. Make sure you discuss any questions you have with your health care provider. Document Released: 05/09/2005 Document Revised: 05/29/2017 Document Reviewed: 01/06/2016 Elsevier Patient Education  2020 [employer]

## 2019-02-05 NOTE — MAU Note (Signed)
Got dizzy, started seeing spots, got really hot.  They checked her pulse, it was 125.  Has hyperemesis, has lost 7/8 # in last 2 wks, maybe dehydrated. No pain

## 2019-02-05 NOTE — MAU Provider Note (Addendum)
History    [name] is a 28 y.o. [phone] at 6480w1d who presents to MAU for an episode of dizziness, sweating, weakness, and "feeling like I am going to pass out." Patient started to feel dizzy this afternoon while transferring a patient to the cath lab. Patient has experienced dizziness before, but never to this degree. This is her first episode of dizziness with "spotty vision" and sweating. She also complains of dyspnea on exertion. She denies loss of consciousness. Patient continues to experience spotty vision while in the MAU, but after resting her dizziness has subsided. Patient has significant history of hyperemesis with previous pregnancies and admits to nausea and vomiting for the past two and a half weeks. This prompts her to vomit at least ten times per day and often follows eating or drinking within an hour. Patient  is compliant with current medications and states that they have helped reduce her episodes of nausea and vomiting from 15-20 times per a day to 10. Despite nausea and vomiting, patient tries to have good oral and fluid intake. Patient has lost 7-8 pounds in the last two weeks. Denies any postural or situational changes that provoke dizziness.   Pt denies VB, LOF, ctx, decreased FM, vaginal discharge/odor/itching. Pt denies abdominal pain, constipation, diarrhea, or urinary problems. Pt denies fever, chills, fatigue, or changes in appetite. Pt denies SOB or chest pain. Pt denies HA.  Allergies: Shellfish - anaphylaxis, Latex - itching Current medications/supplements? Reglan. Unisom. Patient is waiting to take prenatal vitamin until the end of her first trimester to help reduce N/V.  Prenatal care provider? [facility]  CSN: [id number]  Arrival date and time: 02/05/19 1334   First Provider Initiated Contact with Patient 02/05/19 1422      Chief Complaint  Patient presents with  . Dizziness   HPI  OB History    Gravida  8   Para  2   Term  2   Preterm       AB  5   Living  2     SAB  5   TAB      Ectopic      Multiple  0   Live Births  2        Obstetric Comments  Patient has had multiple miscarriages- she has had 2 recent SAB. The most recent is reported 1 1/2 months ago. Pt states she may have had up to 8 pregnancies, has had multiple SAB- lost count.        Past Medical History:  Diagnosis Date  . Allergy   . Anemia   . Depression   . Headache(784.0)   . PONV (postoperative nausea and vomiting)   . Spontaneous abortion 12/03/2010    Past Surgical History:  Procedure Laterality Date  . CHOLECYSTECTOMY N/A 05/13/2015   Procedure: LAPAROSCOPIC CHOLECYSTECTOMY ;  Surgeon: Manus RuddMatthew Tsuei, MD;  Location: [facility] OR;  Service: General;  Laterality: N/A;  . DILATION AND EVACUATION  12/03/2010   Procedure: DILATATION AND EVACUATION (D&E);  Surgeon: Roseanna RainbowLisa A Jackson-Moore, MD;  Location: [facility];  Service: Gynecology;  Laterality: N/A;  . OVARIAN CYST REMOVAL    . TONSILLECTOMY      Family History  Problem Relation Age of Onset  . Hypertension Other   . Diabetes Other   . Stroke Other   . Cancer Other   . Heart failure Other   . Seizures Other     Social History   Tobacco Use  . Smoking  status: Never Smoker  . Smokeless tobacco: Never Used  Substance Use Topics  . Alcohol use: No    Alcohol/week: 0.0 standard drinks    Comment: Ocassionally  . Drug use: No    Allergies:  Allergies  Allergen Reactions  . Shellfish Allergy Anaphylaxis  . Latex Itching    Medications Prior to Admission  Medication Sig Dispense Refill Last Dose  . doxylamine, Sleep, (UNISOM) 25 MG tablet Take 1 tablet (25 mg total) by mouth 2 (two) times daily at 8 am and 10 pm. 60 tablet 6 02/04/2019 at Unknown time  . metoCLOPramide (REGLAN) 10 MG tablet TAKE 1 TABLET BY MOUTH EVERY 8 HOURS AS NEEDED FOR NAUSEA AND VOMITING 30 tablet 5 02/05/2019 at Unknown time  . cyclobenzaprine (FLEXERIL) 5 MG tablet TAKE 1 TABLET BY MOUTH THREE TIMES DAILY  AS NEEDED FOR MUSCLE SPASMS 30 tablet 0   . Doxylamine-Pyridoxine (DICLEGIS) 10-10 MG TBEC Take 2 tablets by mouth at bedtime. If symptoms persist, add one tablet in the morning and one in the afternoon 100 tablet 5 Unknown at Unknown time  . Prenatal-DSS-FeCb-FeGl-FA (CITRANATAL BLOOM) 90-1 MG TABS Take 1 tablet by mouth daily before breakfast. 30 tablet 11 Unknown at Unknown time    Review of Systems  Constitutional: Positive for diaphoresis. Negative for activity change and fever.  Respiratory: Positive for shortness of breath (on exertion).   Cardiovascular: Negative for chest pain, palpitations and leg swelling.  Gastrointestinal: Positive for nausea and vomiting. Negative for abdominal pain, constipation and diarrhea.  Genitourinary: Negative for dysuria, vaginal bleeding and vaginal discharge.  Neurological: Positive for dizziness, weakness and light-headedness. Negative for syncope and headaches.   Physical Exam   Blood pressure (!) 136/93, pulse 92, temperature 98.3 F (36.8 C), resp. rate 18, weight 94.3 kg, last menstrual period 11/26/2018, SpO2 100 %, currently breastfeeding.  Physical Exam  Constitutional: She is oriented to person, place, and time. She appears well-developed and well-nourished.  HENT:  Head: Normocephalic and atraumatic.  Eyes: Pupils are equal, round, and reactive to light. Conjunctivae and EOM are normal.  Neck: Normal range of motion. Neck supple.  Cardiovascular: Normal heart sounds.  Respiratory: Effort normal and breath sounds normal.  GI: Soft. Bowel sounds are normal.  Musculoskeletal: Normal range of motion.  Neurological: She is alert and oriented to person, place, and time.    MAU Course/MDM  Procedures Urinalysis CMP CBC  Micaela M Campagna 02/05/2019, 2:47 PM   Patient Vitals for the past 24 hrs:  BP Temp Pulse Resp SpO2 Weight  02/05/19 1925 134/84 - 79 - - -  02/05/19 1635 114/78 - 69 - - -  02/05/19 1420 - - - - 100 % -   02/05/19 1415 - - - - 100 % -  02/05/19 1409 (!) 136/98 - 86 - - -  02/05/19 1350 (!) 136/93 98.3 F (36.8 C) 92 18 100 % 94.3 kg  02/05/19 1347 - - - - 100 % -   Results for orders placed or performed during the hospital encounter of 02/05/19 (from the past 24 hour(s))  Glucose, capillary     Status: None   Collection Time: 02/05/19  2:15 PM  Result Value Ref Range   Glucose-Capillary 96 70 - 99 mg/dL  Urinalysis, Routine w reflex microscopic     Status: Abnormal   Collection Time: 02/05/19  2:41 PM  Result Value Ref Range   Color, Urine AMBER (A) YELLOW   APPearance HAZY (A) CLEAR   Specific  Gravity, Urine 1.045 (H) 1.005 - 1.030   pH 5.0 5.0 - 8.0   Glucose, UA NEGATIVE NEGATIVE mg/dL   Hgb urine dipstick NEGATIVE NEGATIVE   Bilirubin Urine NEGATIVE NEGATIVE   Ketones, ur 5 (A) NEGATIVE mg/dL   Protein, ur 233 (A) NEGATIVE mg/dL   Nitrite NEGATIVE NEGATIVE   Leukocytes,Ua NEGATIVE NEGATIVE   RBC / HPF 21-50 0 - 5 RBC/hpf   WBC, UA 0-5 0 - 5 WBC/hpf   Bacteria, UA NONE SEEN NONE SEEN   Squamous Epithelial / LPF 0-5 0 - 5   Mucus PRESENT   CBC with Differential/Platelet     Status: None   Collection Time: 02/05/19  3:51 PM  Result Value Ref Range   WBC 9.1 4.0 - 10.5 K/uL   RBC 4.31 3.87 - 5.11 MIL/uL   Hemoglobin 12.9 12.0 - 15.0 g/dL   HCT 00.7 62.2 - 63.3 %   MCV 87.5 80.0 - 100.0 fL   MCH 29.9 26.0 - 34.0 pg   MCHC 34.2 30.0 - 36.0 g/dL   RDW 35.4 56.2 - 56.3 %   Platelets 276 150 - 400 K/uL   nRBC 0.0 0.0 - 0.2 %   Neutrophils Relative % 64 %   Neutro Abs 5.8 1.7 - 7.7 K/uL   Lymphocytes Relative 28 %   Lymphs Abs 2.5 0.7 - 4.0 K/uL   Monocytes Relative 7 %   Monocytes Absolute 0.6 0.1 - 1.0 K/uL   Eosinophils Relative 1 %   Eosinophils Absolute 0.1 0.0 - 0.5 K/uL   Basophils Relative 0 %   Basophils Absolute 0.0 0.0 - 0.1 K/uL   Immature Granulocytes 0 %   Abs Immature Granulocytes 0.02 0.00 - 0.07 K/uL  Comprehensive metabolic panel     Status:  Abnormal   Collection Time: 02/05/19  3:51 PM  Result Value Ref Range   Sodium 134 (L) 135 - 145 mmol/L   Potassium 3.4 (L) 3.5 - 5.1 mmol/L   Chloride 100 98 - 111 mmol/L   CO2 24 22 - 32 mmol/L   Glucose, Bld 97 70 - 99 mg/dL   BUN 7 6 - 20 mg/dL   Creatinine, Ser 8.93 0.44 - 1.00 mg/dL   Calcium 9.2 8.9 - 73.4 mg/dL   Total Protein 7.0 6.5 - 8.1 g/dL   Albumin 3.7 3.5 - 5.0 g/dL   AST 35 15 - 41 U/L   ALT 73 (H) 0 - 44 U/L   Alkaline Phosphatase 69 38 - 126 U/L   Total Bilirubin 0.8 0.3 - 1.2 mg/dL   GFR calc non Af Amer >60 >60 mL/min   GFR calc Af Amer >60 >60 mL/min   Anion gap 10 5 - 15   Meds ordered this encounter  Medications  . lactated ringers bolus 1,000 mL  . multivitamins adult (INFUVITE ADULT) 10 mL in dextrose 5% lactated ringers 1,000 mL infusion  . famotidine (PEPCID) IVPB 20 mg premix  . metoCLOPramide (REGLAN) injection 10 mg  . scopolamine (TRANSDERM-SCOP) 1 MG/3DAYS 1.5 mg  . scopolamine (TRANSDERM-SCOP) 1 MG/3DAYS    Sig: Place 1 patch (1.5 mg total) onto the skin every 3 (three) days.    Dispense:  10 patch    Refill:  12    Order Specific Question:   Supervising Provider    Answer:   CONSTANT, PEGGY [[id number]]  . famotidine (PEPCID) 20 MG tablet    Sig: Take 1 tablet (20 mg total) by mouth 2 (two) times daily.  Dispense:  30 tablet    Refill:  0    Order Specific Question:   Supervising Provider    Answer:   CONSTANT, PEGGY [[id number]]   Assessment and Plan  --28 y.o. [id number] at [redacted]w[redacted]d  --Emesis x 1 episode in MAU --Tolerating PO at time of discharge --Discharge home in stable condition  I confirm that I have verified the information documented in the physician assistant student's note and that I have also personally reperformed the history, physical exam and all medical decision making activities of this service and have verified that all service and findings are accurately documented in this student's note.   Pt verbalizes that she had severe  vomiting and lost about 30 lbs in her previous pregnancies. Due to "inevitable" vomiting she does not focus on food selection. Encouraged patient to reconsider bland simple carbs with slow intake in small amounts.  Reviewed lab results with Dr. Nehemiah Settle prior to patient discharge  F/U: Pt has New OB appt Friday 02/08/19 at [place], MSN, CNM Certified Nurse Midwife, [facility] for [employer], [place] 02/05/19 8:20 PM

## 2019-02-08 ENCOUNTER — Other Ambulatory Visit: Payer: Self-pay

## 2019-02-08 ENCOUNTER — Ambulatory Visit (INDEPENDENT_AMBULATORY_CARE_PROVIDER_SITE_OTHER): Payer: 59 | Admitting: Family Medicine

## 2019-02-08 ENCOUNTER — Encounter: Payer: Self-pay | Admitting: Family Medicine

## 2019-02-08 VITALS — BP 118/76 | HR 70 | Temp 97.8°F | Wt 208.6 lb

## 2019-02-08 DIAGNOSIS — Z3A10 10 weeks gestation of pregnancy: Secondary | ICD-10-CM

## 2019-02-08 DIAGNOSIS — O0991 Supervision of high risk pregnancy, unspecified, first trimester: Secondary | ICD-10-CM

## 2019-02-08 DIAGNOSIS — Z3480 Encounter for supervision of other normal pregnancy, unspecified trimester: Secondary | ICD-10-CM | POA: Diagnosis not present

## 2019-02-08 DIAGNOSIS — O0993 Supervision of high risk pregnancy, unspecified, third trimester: Secondary | ICD-10-CM | POA: Insufficient documentation

## 2019-02-08 DIAGNOSIS — Z23 Encounter for immunization: Secondary | ICD-10-CM

## 2019-02-08 DIAGNOSIS — Z31430 Encounter of female for testing for genetic disease carrier status for procreative management: Secondary | ICD-10-CM | POA: Diagnosis not present

## 2019-02-08 DIAGNOSIS — O210 Mild hyperemesis gravidarum: Secondary | ICD-10-CM

## 2019-02-08 DIAGNOSIS — N3000 Acute cystitis without hematuria: Secondary | ICD-10-CM

## 2019-02-08 MED ORDER — ASPIRIN EC 81 MG PO TBEC: 81.0000 mg | DELAYED_RELEASE_TABLET | Freq: Every day | ORAL | 2 refills | Status: DC

## 2019-02-08 MED ORDER — ONDANSETRON 4 MG PO TBDP: 4.0000 mg | ORAL_TABLET | Freq: Four times a day (QID) | ORAL | 0 refills | Status: DC | PRN

## 2019-02-08 MED ORDER — BLOOD PRESSURE KIT DEVI: 1.0000 | 0 refills | Status: AC

## 2019-02-08 NOTE — Progress Notes (Signed)
NEW OB.  FLU givenin RD, tolerated well.  C/o Elevated BP readings, blurry vision, ha 10/10, dizziness, NV, LUQ pain 8/10.

## 2019-02-08 NOTE — Progress Notes (Signed)
History:   Carla [name] is a 28 y.o. [phone] at 73w4dby LMP, early ultrasound being seen today for her first obstetrical visit.  Her obstetrical history is significant for obesity and gestational HTN with second pregnancy. Patient does intend to breast feed. Pregnancy history fully reviewed. Reports that with first pregnancy she "went into labor at 27 weeks and was put on bedrest and given medications daily to stop contractions and ultimately delivered at 38.5."  Patient reports nausea and vomiting. Hx of hyperemesis gravidarum with two prior pregnancies that last the whole pregnancy. She has been Unisom, Reglan, Scopolamine. Phenergan makes her too sleepy. Keeping about 1 meal down per day. Seen in MAU earlier this pregnancy. Feeling tired but no complaints.       HISTORY: OB History  Gravida Para Term Preterm AB Living  _0 0 5 2  SAB TAB Ectopic Multiple Live Births  5 0 0 0 2    # Outcome Date GA Lbr Len/2nd Weight Sex Delivery Anes PTL Lv  8 Current           7 Term 08/25/17 383w6d1:09 / 00:27 6 lb 8.1 oz (2.951 kg) F Vag-Spont EPI  LIV     Birth Comments: None     Name: [name],GIRL Carla     Apgar1: 8  Apgar5: 9  6 Term 05/25/13 3846w5d:01 / 01:14 6 lb 12.8 oz (3.085 kg) F Vag-Spont EPI  LIV     Name: [name],GIRL Carla     Apgar1: 9  Apgar5: 9  5 SAB           4 SAB           3 SAB           2 SAB           1 SAB             Obstetric Comments  Patient has had multiple miscarriages- she has had 2 recent SAB. The most recent is reported 1 1/2 months ago.  Pt states she may have had up to 8 pregnancies, has had multiple SAB- lost count.    Last pap smear was done 11/02/17 and was normal  Past Medical History:  Diagnosis Date  . Allergy   . Anemia   . Depression   . Headache(784.0)   . PONV (postoperative nausea and vomiting)   . Pregnancy induced hypertension   . Spontaneous abortion 12/03/2010   Past Surgical History:  Procedure Laterality Date  .  CHOLECYSTECTOMY N/A 05/13/2015   Procedure: LAPAROSCOPIC CHOLECYSTECTOMY ;  Surgeon: MatDonnie MesaD;  [facility];  Laterality: N/A;  . DILATION AND EVACUATION  12/03/2010   Procedure: DILATATION AND EVACUATION (D&E);  Surgeon: LisAgnes LawrenceD;  [facility];  Service: Gynecology;  Laterality: N/A;  . OVARIAN CYST REMOVAL    . TONSILLECTOMY     Family History  Problem Relation Age of Onset  . Arthritis Mother   . Cancer Mother   . Diabetes Mother   . Hearing loss Mother   . Hypertension Mother   . Obesity Mother   . Hypertension Father   . Stroke Father   . Hypertension Other   . Diabetes Other   . Stroke Other   . Cancer Other   . Heart failure Other   . Seizures Other   . ADD / ADHD Brother   . Asthma Brother   . Learning disabilities  Brother   . Alcohol abuse Paternal Aunt   . Alcohol abuse Paternal Uncle   . COPD Maternal Grandmother   . Cancer Maternal Grandfather    Social History   Tobacco Use  . Smoking status: Never Smoker  . Smokeless tobacco: Never Used  Substance Use Topics  . Alcohol use: No    Alcohol/week: 0.0 standard drinks    Comment: Ocassionally  . Drug use: No   Allergies  Allergen Reactions  . Shellfish Allergy Anaphylaxis  . Latex Itching   Current Outpatient Medications on File Prior to Visit  Medication Sig Dispense Refill  . cyclobenzaprine (FLEXERIL) 5 MG tablet TAKE 1 TABLET BY MOUTH THREE TIMES DAILY AS NEEDED FOR MUSCLE SPASMS (Patient taking differently: Take 5 mg by mouth 3 (three) times daily as needed for muscle spasms. ) 30 tablet 0  . doxylamine, Sleep, (UNISOM) 25 MG tablet Take 1 tablet (25 mg total) by mouth 2 (two) times daily at 8 am and 10 pm. (Patient taking differently: Take 25 mg by mouth at bedtime. ) 60 tablet 6  . famotidine (PEPCID) 20 MG tablet Take 1 tablet (20 mg total) by mouth 2 (two) times daily. 30 tablet 0  . metoCLOPramide (REGLAN) 10 MG tablet TAKE 1 TABLET BY MOUTH  EVERY 8 HOURS AS NEEDED FOR NAUSEA AND VOMITING (Patient taking differently: Take 10 mg by mouth every 8 (eight) hours as needed for nausea or vomiting. ) 30 tablet 5  . pyridOXINE (VITAMIN B-6) 100 MG tablet Take 200 mg by mouth at bedtime.    Marland Kitchen scopolamine (TRANSDERM-SCOP) 1 MG/3DAYS Place 1 patch (1.5 mg total) onto the skin every 3 (three) days. 10 patch 12   No current facility-administered medications on file prior to visit.     Review of Systems Pertinent items noted in HPI and remainder of comprehensive ROS otherwise negative. Physical Exam:   Vitals:   02/08/19 0938  BP: 118/76  Pulse: 70  Temp: 97.8 F (36.6 C)  Weight: 208 lb 9.6 oz (94.6 kg)     Uterus:      Adnexa: normal adnexa and no mass, fullness, tenderness   Bony Pelvis: average  System: General: well-developed, well-nourished female in no acute distress   Breasts:  normal appearance, no masses or tenderness bilaterally   Skin: normal coloration and turgor, no rashes   Neurologic: oriented, normal, negative, normal mood   Extremities: normal strength, tone, and muscle mass, ROM of all joints is normal   HEENT extraocular movement intact and sclera clear, anicteric   Cardiovascular: regular rate    Respiratory:  no respiratory distress   Abdomen: soft, non-tender       Assessment:    Pregnancy: [id number] Patient Active Problem List   Diagnosis Date Noted  . Supervision of other normal pregnancy, antepartum 02/08/2019     Plan:    1. Supervision of other normal pregnancy, antepartum Carla [name] was seen today for initial prenatal visit.  Diagnoses and all orders for this visit:  Supervision of high risk pregnancy in first trimester -     Enroll Patient in Babyscripts -     Babyscripts Schedule Optimization -     Obstetric Panel, Including HIV -     Culture, OB Urine -     Genetic Screening -     Blood Pressure Monitoring (BLOOD PRESSURE KIT) DEVI; 1 kit by Does not apply route once a week. CHECK BP  WEEKLY.  LARGE CUFF. DX O09.0 -  Flu Vaccine QUAD 36+ mos IM (Fluarix, Quad PF) -     Comprehensive metabolic panel -     Hemoglobin A1c -     Protein / creatinine ratio, urine -     Ordered: aspirin EC 81 MG tablet; Take 1 tablet (81 mg total) by mouth daily. Take after 12 weeks for prevention of preeclampsia later in pregnancy -     [place] MFM OB DETAIL +14 WK; Future - RTC in 4 weeks; virtual okay - ALT 73 on 9/15; will follow with repeat CMP  Hyperemesis gravidarum -     Ordered: ondansetron (ZOFRAN ODT) 4 MG disintegrating tablet; Take 1 tablet (4 mg total) by mouth every 6 (six) hours as needed for nausea. - Not tachycardic. Tolerating some PO. Appears well-hydrated. Will trial Zofran as needed.    Initial labs drawn. Wet prep and G/C drawn in MAU at 6 weeks. Continue prenatal vitamins. Genetic Screening discussed: ordered. Ultrasound discussed; fetal anatomic survey: ordered. Problem list reviewed and updated. The nature of [place] with multiple MDs and other Advanced Practice Providers was explained to patient; also emphasized that residents, students are part of our team. Routine obstetric precautions reviewed. Return in about 4 weeks (around 03/08/2019) for ROB; virtual .     Barrington Ellison, MD [facility] Fellow, [facility] for [facility], [place]

## 2019-02-09 LAB — COMPREHENSIVE METABOLIC PANEL
ALT: 37 IU/L — ABNORMAL HIGH (ref 0–32)
AST: 14 IU/L (ref 0–40)
Albumin/Globulin Ratio: 1.8 (ref 1.2–2.2)
Albumin: 4.1 g/dL (ref 3.9–5.0)
Alkaline Phosphatase: 71 IU/L (ref 39–117)
BUN/Creatinine Ratio: 10 (ref 9–23)
BUN: 6 mg/dL (ref 6–20)
Bilirubin Total: 0.7 mg/dL (ref 0.0–1.2)
CO2: 20 mmol/L (ref 20–29)
Calcium: 9 mg/dL (ref 8.7–10.2)
Chloride: 102 mmol/L (ref 96–106)
Creatinine, Ser: 0.61 mg/dL (ref 0.57–1.00)
GFR calc Af Amer: 141 mL/min/{1.73_m2} (ref >59)
GFR calc non Af Amer: 122 mL/min/{1.73_m2} (ref >59)
Globulin, Total: 2.3 g/dL (ref 1.5–4.5)
Glucose: 102 mg/dL — ABNORMAL HIGH (ref 65–99)
Potassium: 3 mmol/L — ABNORMAL LOW (ref 3.5–5.2)
Sodium: 135 mmol/L (ref 134–144)
Total Protein: 6.4 g/dL (ref 6.0–8.5)

## 2019-02-09 LAB — OBSTETRIC PANEL, INCLUDING HIV
Antibody Screen: NEGATIVE
Basophils Absolute: 0 10*3/uL (ref 0.0–0.2)
Basos: 0 %
EOS (ABSOLUTE): 0 10*3/uL (ref 0.0–0.4)
Eos: 0 %
HIV Screen 4th Generation wRfx: NONREACTIVE
Hematocrit: 37.5 % (ref 34.0–46.6)
Hemoglobin: 12.5 g/dL (ref 11.1–15.9)
Hepatitis B Surface Ag: NEGATIVE
Immature Grans (Abs): 0 10*3/uL (ref 0.0–0.1)
Immature Granulocytes: 0 %
Lymphocytes Absolute: 2.3 10*3/uL (ref 0.7–3.1)
Lymphs: 24 %
MCH: 28.9 pg (ref 26.6–33.0)
MCHC: 33.3 g/dL (ref 31.5–35.7)
MCV: 87 fL (ref 79–97)
Monocytes Absolute: 0.4 10*3/uL (ref 0.1–0.9)
Monocytes: 4 %
Neutrophils Absolute: 7.1 10*3/uL — ABNORMAL HIGH (ref 1.4–7.0)
Neutrophils: 72 %
Platelets: 262 10*3/uL (ref 150–450)
RBC: 4.32 x10E6/uL (ref 3.77–5.28)
RDW: 14.5 % (ref 11.7–15.4)
RPR Ser Ql: NONREACTIVE
Rh Factor: POSITIVE
Rubella Antibodies, IGG: 1.65 index (ref >0.99)
WBC: 9.9 10*3/uL (ref 3.4–10.8)

## 2019-02-09 LAB — PROTEIN / CREATININE RATIO, URINE
Creatinine, Urine: 502.9 mg/dL
Protein, Ur: 43.8 mg/dL
Protein/Creat Ratio: 87 mg/g creat (ref 0–200)

## 2019-02-09 LAB — HEMOGLOBIN A1C
Est. average glucose Bld gHb Est-mCnc: 100 mg/dL
Hgb A1c MFr Bld: 5.1 % (ref 4.8–5.6)

## 2019-02-12 ENCOUNTER — Other Ambulatory Visit: Payer: Self-pay | Admitting: Family Medicine

## 2019-02-12 DIAGNOSIS — O210 Mild hyperemesis gravidarum: Secondary | ICD-10-CM

## 2019-02-14 LAB — CULTURE, OB URINE

## 2019-02-14 LAB — URINE CULTURE, OB REFLEX

## 2019-02-14 MED ORDER — AMOXICILLIN-POT CLAVULANATE 875-125 MG PO TABS: 1.0000 | ORAL_TABLET | Freq: Two times a day (BID) | ORAL | 0 refills | Status: AC

## 2019-02-14 NOTE — Addendum Note (Signed)
Addended by: Barrington Ellison on: 02/14/2019 05:21 PM   Modules accepted: Orders

## 2019-02-18 ENCOUNTER — Telehealth (INDEPENDENT_AMBULATORY_CARE_PROVIDER_SITE_OTHER): Payer: 59 | Admitting: Obstetrics

## 2019-02-18 ENCOUNTER — Encounter: Payer: Self-pay | Admitting: Obstetrics and Gynecology

## 2019-02-18 ENCOUNTER — Encounter: Payer: Self-pay | Admitting: Obstetrics

## 2019-02-18 VITALS — BP 131/93 | HR 93 | Wt 204.0 lb

## 2019-02-18 DIAGNOSIS — O0990 Supervision of high risk pregnancy, unspecified, unspecified trimester: Secondary | ICD-10-CM

## 2019-02-18 DIAGNOSIS — Z3A12 12 weeks gestation of pregnancy: Secondary | ICD-10-CM

## 2019-02-18 DIAGNOSIS — Z3490 Encounter for supervision of normal pregnancy, unspecified, unspecified trimester: Secondary | ICD-10-CM

## 2019-02-18 DIAGNOSIS — O210 Mild hyperemesis gravidarum: Secondary | ICD-10-CM

## 2019-02-18 DIAGNOSIS — O0992 Supervision of high risk pregnancy, unspecified, second trimester: Secondary | ICD-10-CM

## 2019-02-18 NOTE — Progress Notes (Signed)
   TELEHEALTH OBSTETRICS PRENATAL VIRTUAL VIDEO VISIT ENCOUNTER NOTE  Provider location: Center for [facility] at [place]   I connected with [name] on 02/18/19 at 10:15 AM EDT by OB MyChart Video Encounter at home and verified that I am speaking with the correct person using two identifiers.   I discussed the limitations, risks, security and privacy concerns of performing an evaluation and management service virtually and the availability of in person appointments. I also discussed with the patient that there may be a patient responsible charge related to this service. The patient expressed understanding and agreed to proceed. Subjective:  Carla [name] is a 28 y.o. [phone] at [redacted]w[redacted]d being seen today for ongoing prenatal care.  She is currently monitored for the following issues for this high-risk pregnancy and has Supervision of other normal pregnancy, antepartum on their problem list.  Patient reports nausea and vomiting.   .  .   . Denies any leaking of fluid.   The following portions of the patient's history were reviewed and updated as appropriate: allergies, current medications, past family history, past medical history, past social history, past surgical history and problem list.   Objective:   Vitals:   02/18/19 1035  BP: (!) 131/93  Pulse: 93  Weight: 204 lb (92.5 kg)    Fetal Status:           General:  Alert, oriented and cooperative. Patient is in no acute distress.  Respiratory: Normal respiratory effort, no problems with respiration noted  Mental Status: Normal mood and affect. Normal behavior. Normal judgment and thought content.  Rest of physical exam deferred due to type of encounter  Imaging: No results found.  Assessment and Plan:  Pregnancy: [id number] at [redacted]w[redacted]d  1. Supervision of high risk pregnancy, antepartum  2. Hyperemesis gravidarum - continue B6 and Doxylamine Succinate - dietary changes instituted with frequent small snacks - Boost  caloric supplement Rx - maternity leave recommended because of the severity of the nausea and vomiting   Preterm labor symptoms and general obstetric precautions including but not limited to vaginal bleeding, contractions, leaking of fluid and fetal movement were reviewed in detail with the patient. I discussed the assessment and treatment plan with the patient. The patient was provided an opportunity to ask questions and all were answered. The patient agreed with the plan and demonstrated an understanding of the instructions. The patient was advised to call back or seek an in-person office evaluation/go to [facility]U at [facility] for any urgent or concerning symptoms. Please refer to After Visit Summary for other counseling recommendations.   I provided 10 minutes of face-to-face time during this encounter.  Return in about 4 weeks (around 03/18/2019) for MyChart.  Future Appointments  Date Time Provider [place]  03/08/2019  9:30 AM Shelly Bombard, MD [place] None  04/09/2019 10:00 AM [facility]-MFC [place] 3 WH-MFCUS MFC-US    Charles Harper, MD Center for [facility], [place] [facility] 02/18/2019

## 2019-02-18 NOTE — Progress Notes (Signed)
Pt is on the phone preparing for virtual visit with provider for hyperemesis. Pt has tried scopolamine patches, zofran, and reglan and nothing is helping. Pt reports that she is unable to keep anything down, not even water. Pt reports hx of hyperemesis with last 2 pregnancies.

## 2019-02-19 ENCOUNTER — Encounter: Payer: Self-pay | Admitting: Obstetrics and Gynecology

## 2019-02-19 ENCOUNTER — Encounter: Payer: Self-pay | Admitting: *Deleted

## 2019-02-19 ENCOUNTER — Telehealth: Payer: Self-pay

## 2019-02-19 NOTE — Telephone Encounter (Signed)
S/w Vivian Rivera(authorized on DPR) with pt's permission, and advised of gender results.

## 2019-02-20 ENCOUNTER — Telehealth: Payer: Self-pay

## 2019-02-20 DIAGNOSIS — Z3490 Encounter for supervision of normal pregnancy, unspecified, unspecified trimester: Secondary | ICD-10-CM

## 2019-02-20 NOTE — Telephone Encounter (Signed)
  Spoke with pt about FMLA  paperwork, duration of leave, transferred to front office to pay for paperwork so that they can be faxed today.

## 2019-02-28 ENCOUNTER — Other Ambulatory Visit: Payer: Self-pay | Admitting: Family Medicine

## 2019-02-28 DIAGNOSIS — O210 Mild hyperemesis gravidarum: Secondary | ICD-10-CM

## 2019-03-08 ENCOUNTER — Telehealth (INDEPENDENT_AMBULATORY_CARE_PROVIDER_SITE_OTHER): Payer: 59 | Admitting: Obstetrics

## 2019-03-08 ENCOUNTER — Encounter: Payer: Self-pay | Admitting: Obstetrics

## 2019-03-08 VITALS — BP 113/91 | HR 94 | Wt 205.0 lb

## 2019-03-08 DIAGNOSIS — O139 Gestational [pregnancy-induced] hypertension without significant proteinuria, unspecified trimester: Secondary | ICD-10-CM

## 2019-03-08 DIAGNOSIS — O0992 Supervision of high risk pregnancy, unspecified, second trimester: Secondary | ICD-10-CM

## 2019-03-08 DIAGNOSIS — O0990 Supervision of high risk pregnancy, unspecified, unspecified trimester: Secondary | ICD-10-CM

## 2019-03-08 DIAGNOSIS — Z3A14 14 weeks gestation of pregnancy: Secondary | ICD-10-CM

## 2019-03-08 DIAGNOSIS — O210 Mild hyperemesis gravidarum: Secondary | ICD-10-CM

## 2019-03-08 DIAGNOSIS — O132 Gestational [pregnancy-induced] hypertension without significant proteinuria, second trimester: Secondary | ICD-10-CM

## 2019-03-08 NOTE — Progress Notes (Signed)
S/w pt for mychart visit. Pt reports some sharp pain occasionally in back and butt that last for about an hour. Pt reports fetal flutter movements.

## 2019-03-08 NOTE — Progress Notes (Signed)
   TELEHEALTH OBSTETRICS PRENATAL VIRTUAL VIDEO VISIT ENCOUNTER NOTE  Provider location: [facility] for [facility] at [place]   I connected with [name] on 03/08/19 at  9:30 AM EDT by OB MyChart Video Encounter at home and verified that I am speaking with the correct person using two identifiers.   I discussed the limitations, risks, security and privacy concerns of performing an evaluation and management service virtually and the availability of in person appointments. I also discussed with the patient that there may be a patient responsible charge related to this service. The patient expressed understanding and agreed to proceed. Subjective:  Carla [name] is a 28 y.o. [phone] at [redacted]w[redacted]d being seen today for ongoing prenatal care.  She is currently monitored for the following issues for this high-risk pregnancy and has Supervision of other normal pregnancy, antepartum on their problem list.  Patient reports nausea and vomiting.  Contractions: Not present. Vag. Bleeding: None.  Movement: Present. Denies any leaking of fluid.   The following portions of the patient's history were reviewed and updated as appropriate: allergies, current medications, past family history, past medical history, past social history, past surgical history and problem list.   Objective:   Vitals:   03/08/19 0944  BP: (!) 113/91  Pulse: 94  Weight: 205 lb (93 kg)    Fetal Status:     Movement: Present     General:  Alert, oriented and cooperative. Patient is in no acute distress.  Respiratory: Normal respiratory effort, no problems with respiration noted  Mental Status: Normal mood and affect. Normal behavior. Normal judgment and thought content.  Rest of physical exam deferred due to type of encounter  Imaging: No results found.  Assessment and Plan:  Pregnancy: [id number] at [redacted]w[redacted]d 1. Supervision of high risk pregnancy, antepartum  2. Gestational hypertension, antepartum - Stage 1 - improving  with nutritional supplementation with Ensure  3. Hyperemesis gravidarum -  improving with nutritional supplementation with Ensure - appetite slowly increasing, and less N/V   Preterm labor symptoms and general obstetric precautions including but not limited to vaginal bleeding, contractions, leaking of fluid and fetal movement were reviewed in detail with the patient. I discussed the assessment and treatment plan with the patient. The patient was provided an opportunity to ask questions and all were answered. The patient agreed with the plan and demonstrated an understanding of the instructions. The patient was advised to call back or seek an in-person office evaluation/go to MAU at [facility] for any urgent or concerning symptoms. Please refer to After Visit Summary for other counseling recommendations.   I provided 10 minutes of face-to-face time during this encounter.  Return in about 4 weeks (around 04/05/2019) for MyChart HOB-Faculty Only.  Future Appointments  Date Time Provider [place]  04/09/2019 10:00 AM WH-MFC [place] 3 WH-MFCUS MFC-US    Charles Harper, MD Center for [facility], [place] [facility] 03/08/2019

## 2019-03-12 DIAGNOSIS — Z3490 Encounter for supervision of normal pregnancy, unspecified, unspecified trimester: Secondary | ICD-10-CM

## 2019-04-02 ENCOUNTER — Other Ambulatory Visit: Payer: Self-pay | Admitting: Family Medicine

## 2019-04-02 DIAGNOSIS — O210 Mild hyperemesis gravidarum: Secondary | ICD-10-CM

## 2019-04-04 ENCOUNTER — Telehealth (INDEPENDENT_AMBULATORY_CARE_PROVIDER_SITE_OTHER): Payer: 59 | Admitting: Obstetrics and Gynecology

## 2019-04-04 ENCOUNTER — Encounter: Payer: Self-pay | Admitting: Obstetrics and Gynecology

## 2019-04-04 DIAGNOSIS — O210 Mild hyperemesis gravidarum: Secondary | ICD-10-CM

## 2019-04-04 DIAGNOSIS — Z3480 Encounter for supervision of other normal pregnancy, unspecified trimester: Secondary | ICD-10-CM

## 2019-04-04 DIAGNOSIS — Z3A18 18 weeks gestation of pregnancy: Secondary | ICD-10-CM

## 2019-04-04 MED ORDER — ONDANSETRON 4 MG PO TBDP: 4.0000 mg | ORAL_TABLET | Freq: Four times a day (QID) | ORAL | 3 refills | Status: DC | PRN

## 2019-04-04 NOTE — Progress Notes (Signed)
   TELEHEALTH OBSTETRICS PRENATAL VIRTUAL VIDEO VISIT ENCOUNTER NOTE  Provider location: Center for [facility] at [place]   I connected with [name] on 04/04/19 at  9:15 AM EST by MyChart Video Encounter at home and verified that I am speaking with the correct person using two identifiers.   I discussed the limitations, risks, security and privacy concerns of performing an evaluation and management service virtually and the availability of in person appointments. I also discussed with the patient that there may be a patient responsible charge related to this service. The patient expressed understanding and agreed to proceed. Subjective:  Carla [name] is a 28 y.o. [phone] at [redacted]w[redacted]d being seen today for ongoing prenatal care.  She is currently monitored for the following issues for this low-risk pregnancy and has Supervision of other normal pregnancy, antepartum on their problem list.  Patient reports no complaints.  Contractions: Irritability. Vag. Bleeding: None.  Movement: Present. Denies any leaking of fluid.   The following portions of the patient's history were reviewed and updated as appropriate: allergies, current medications, past family history, past medical history, past social history, past surgical history and problem list.   Objective:   Vitals:   04/04/19 0912  BP: 119/89  Pulse: 87  Weight: 207 lb 6.4 oz (94.1 kg)    Fetal Status:     Movement: Present     General:  Alert, oriented and cooperative. Patient is in no acute distress.  Respiratory: Normal respiratory effort, no problems with respiration noted  Mental Status: Normal mood and affect. Normal behavior. Normal judgment and thought content.  Rest of physical exam deferred due to type of encounter  Imaging: No results found.  Assessment and Plan:  Pregnancy: [id number] at [redacted]w[redacted]d 1. Supervision of other normal pregnancy, antepartum Patient is doing well without complaints Refill on zofran provided  Anatomy ultrasound on 11/17 - AFP only (15.0-22.6); Future   Preterm labor symptoms and general obstetric precautions including but not limited to vaginal bleeding, contractions, leaking of fluid and fetal movement were reviewed in detail with the patient. I discussed the assessment and treatment plan with the patient. The patient was provided an opportunity to ask questions and all were answered. The patient agreed with the plan and demonstrated an understanding of the instructions. The patient was advised to call back or seek an in-person office evaluation/go to MAU at [facility] for any urgent or concerning symptoms. Please refer to After Visit Summary for other counseling recommendations.   I provided 11 minutes of face-to-face time during this encounter.  Return in about 4 weeks (around 05/02/2019) for Virtual, ROB, Low risk.  Future Appointments  Date Time Provider [place]  04/09/2019 10:00 AM WH-MFC [place] 3 WH-MFCUS MFC-US    Belva Koziel, MD Center for [facility], [place]

## 2019-04-04 NOTE — Progress Notes (Signed)
Pt is on the phone preparing for virtual visit with provider. [redacted]w[redacted]d. Pt is requesting refill on Zofran.

## 2019-04-08 ENCOUNTER — Telehealth: Payer: Self-pay | Admitting: Family Medicine

## 2019-04-08 NOTE — Telephone Encounter (Signed)
Spoke to patient about her appointment on 11/17 @ 11:00. Patient instructed to wear a face mask for the entire appointment and no visitors are allowed with her during the visit. Patient screened for covid symptoms and denied having any

## 2019-04-09 ENCOUNTER — Ambulatory Visit (HOSPITAL_COMMUNITY)
Admission: RE | Admit: 2019-04-09 | Discharge: 2019-04-09 | Disposition: A | Discharge status: Home / Self Care | Payer: 59 | Source: Ambulatory Visit | Attending: Family Medicine | Admitting: Family Medicine

## 2019-04-09 ENCOUNTER — Encounter (HOSPITAL_COMMUNITY): Payer: Self-pay

## 2019-04-09 ENCOUNTER — Other Ambulatory Visit (HOSPITAL_COMMUNITY): Payer: Self-pay | Admitting: *Deleted

## 2019-04-09 ENCOUNTER — Ambulatory Visit (HOSPITAL_COMMUNITY): Payer: 59 | Admitting: *Deleted

## 2019-04-09 ENCOUNTER — Other Ambulatory Visit: Payer: 59

## 2019-04-09 ENCOUNTER — Other Ambulatory Visit: Payer: Self-pay

## 2019-04-09 VITALS — BP 126/85 | HR 89 | Temp 97.9°F

## 2019-04-09 DIAGNOSIS — Z3480 Encounter for supervision of other normal pregnancy, unspecified trimester: Secondary | ICD-10-CM | POA: Diagnosis not present

## 2019-04-09 DIAGNOSIS — Z3A19 19 weeks gestation of pregnancy: Secondary | ICD-10-CM

## 2019-04-09 DIAGNOSIS — O0991 Supervision of high risk pregnancy, unspecified, first trimester: Secondary | ICD-10-CM | POA: Insufficient documentation

## 2019-04-09 DIAGNOSIS — O0990 Supervision of high risk pregnancy, unspecified, unspecified trimester: Secondary | ICD-10-CM | POA: Insufficient documentation

## 2019-04-09 DIAGNOSIS — O99212 Obesity complicating pregnancy, second trimester: Secondary | ICD-10-CM

## 2019-04-09 DIAGNOSIS — Z362 Encounter for other antenatal screening follow-up: Secondary | ICD-10-CM

## 2019-04-11 LAB — AFP, SERUM, OPEN SPINA BIFIDA
AFP MoM: 1.18
AFP Value: 48.3 ng/mL
Gest. Age on Collection Date: 19.1 weeks
Maternal Age At EDD: 31.1 yr
OSBR Risk 1 IN: 6916
Test Results:: NEGATIVE
Weight: 209 [lb_av]

## 2019-05-02 ENCOUNTER — Telehealth (INDEPENDENT_AMBULATORY_CARE_PROVIDER_SITE_OTHER): Payer: 59 | Admitting: Obstetrics & Gynecology

## 2019-05-02 ENCOUNTER — Other Ambulatory Visit: Payer: Self-pay

## 2019-05-02 VITALS — BP 128/86 | HR 96 | Wt 209.0 lb

## 2019-05-02 DIAGNOSIS — O10912 Unspecified pre-existing hypertension complicating pregnancy, second trimester: Secondary | ICD-10-CM

## 2019-05-02 DIAGNOSIS — Z3480 Encounter for supervision of other normal pregnancy, unspecified trimester: Secondary | ICD-10-CM

## 2019-05-02 DIAGNOSIS — O10919 Unspecified pre-existing hypertension complicating pregnancy, unspecified trimester: Secondary | ICD-10-CM

## 2019-05-02 DIAGNOSIS — O99212 Obesity complicating pregnancy, second trimester: Secondary | ICD-10-CM

## 2019-05-02 DIAGNOSIS — O99210 Obesity complicating pregnancy, unspecified trimester: Secondary | ICD-10-CM

## 2019-05-02 DIAGNOSIS — Z3A22 22 weeks gestation of pregnancy: Secondary | ICD-10-CM

## 2019-05-02 MED ORDER — PANTOPRAZOLE SODIUM 40 MG PO TBEC: 40.0000 mg | DELAYED_RELEASE_TABLET | Freq: Every day | ORAL | 1 refills | Status: DC

## 2019-05-02 NOTE — Progress Notes (Signed)
   TELEHEALTH VIRTUAL OBSTETRICS VISIT ENCOUNTER NOTE  I connected with [name] on 05/02/19 at  9:15 AM EST by telephone at home and verified that I am speaking with the correct person using two identifiers.   I discussed the limitations, risks, security and privacy concerns of performing an evaluation and management service by telephone and the availability of in person appointments. I also discussed with the patient that there may be a patient responsible charge related to this service. The patient expressed understanding and agreed to proceed.  Subjective:  Carla [name] is a 28 y.o. [phone] at [redacted]w[redacted]d being followed for ongoing prenatal care.  She is currently monitored for the following issues for this high-risk pregnancy and has Obesity affecting pregnancy, antepartum; Hyperemesis affecting pregnancy, antepartum; Supervision of other normal pregnancy, antepartum; and Chronic hypertension affecting pregnancy on their problem list.  Patient reports nausea and vomiting. Reports fetal movement. Denies any contractions, bleeding or leaking of fluid.   The following portions of the patient's history were reviewed and updated as appropriate: allergies, current medications, past family history, past medical history, past social history, past surgical history and problem list.   Objective:   General:  Alert, oriented and cooperative.   Mental Status: Normal mood and affect perceived. Normal judgment and thought content.  Rest of physical exam deferred due to type of encounter  Assessment and Plan:  Pregnancy: [id number] at [redacted]w[redacted]d 1. Supervision of other normal pregnancy, antepartum Still with significant hyperemesis sx - pantoprazole (PROTONIX) 40 MG tablet; Take 1 tablet (40 mg total) by mouth daily.  Dispense: 30 tablet; Refill: 1 Continue other medication except for Pepcid 2. Obesity affecting pregnancy, antepartum Body mass index is 35.87 kg/m. Modest weight gain noted  3. Chronic  hypertension affecting pregnancy Follow BP, continue ASA  Preterm labor symptoms and general obstetric precautions including but not limited to vaginal bleeding, contractions, leaking of fluid and fetal movement were reviewed in detail with the patient.  I discussed the assessment and treatment plan with the patient. The patient was provided an opportunity to ask questions and all were answered. The patient agreed with the plan and demonstrated an understanding of the instructions. The patient was advised to call back or seek an in-person office evaluation/go to MAU at [facility] for any urgent or concerning symptoms. Please refer to After Visit Summary for other counseling recommendations.   I provided 12 minutes of non-face-to-face time during this encounter.  Return in about 2 weeks (around 05/16/2019) for virtual.  Future Appointments  Date Time Provider [place]  05/07/2019 10:15 AM [place] MFC-US  05/07/2019 10:15 AM [facility]-MFC [place] 4 WH-MFCUS MFC-US    Emeterio Reeve, [place] for [place], [place]

## 2019-05-02 NOTE — Patient Instructions (Signed)
Hyperemesis Gravidarum °Hyperemesis gravidarum is a severe form of nausea and vomiting that happens during pregnancy. Hyperemesis is worse than morning sickness. It may cause you to have nausea or vomiting all day for many days. It may keep you from eating and drinking enough food and liquids, which can lead to dehydration, malnutrition, and weight loss. Hyperemesis usually occurs during the first half (the first 20 weeks) of pregnancy. It often goes away once a woman is in her second half of pregnancy. However, sometimes hyperemesis continues through an entire pregnancy. °What are the causes? °The cause of this condition is not known. It may be related to changes in chemicals (hormones) in the body during pregnancy, such as the high level of pregnancy hormone (human chorionic gonadotropin) or the increase in the female sex hormone (estrogen). °What are the signs or symptoms? °Symptoms of this condition include: °· Nausea that does not go away. °· Vomiting that does not allow you to keep any food down. °· Weight loss. °· Body fluid loss (dehydration). °· Having no desire to eat, or not liking food that you have previously enjoyed. °How is this diagnosed? °This condition may be diagnosed based on: °· A physical exam. °· Your medical history. °· Your symptoms. °· Blood tests. °· Urine tests. °How is this treated? °This condition is managed by controlling symptoms. This may include: °· Following an eating plan. This can help lessen nausea and vomiting. °· Taking prescription medicines. °An eating plan and medicines are often used together to help control symptoms. If medicines do not help relieve nausea and vomiting, you may need to receive fluids through an IV at the hospital. °Follow these instructions at home: °Eating and drinking ° °· Avoid the following: °? Drinking fluids with meals. Try not to drink anything during the 30 minutes before and after your meals. °? Drinking more than 1 cup of fluid at a  time. °? Eating foods that trigger your symptoms. These may include spicy foods, coffee, high-fat foods, very sweet foods, and acidic foods. °? Skipping meals. Nausea can be more intense on an empty stomach. If you cannot tolerate food, do not force it. Try sucking on ice chips or other frozen items and make up for missed calories later. °? Lying down within 2 hours after eating. °? Being exposed to environmental triggers. These may include food smells, smoky rooms, closed spaces, rooms with strong smells, warm or humid places, overly loud and noisy rooms, and rooms with motion or flickering lights. Try eating meals in a well-ventilated area that is free of strong smells. °? Quick and sudden changes in your movement. °? Taking iron pills and multivitamins that contain iron. If you take prescription iron pills, do not stop taking them unless your health care provider approves. °? Preparing food. The smell of food can spoil your appetite or trigger nausea. °· To help relieve your symptoms: °? Listen to your body. Everyone is different and has different preferences. Find what works best for you. °? Eat and drink slowly. °? Eat 5-6 small meals daily instead of 3 large meals. Eating small meals and snacks can help you avoid an empty stomach. °? In the morning, before getting out of bed, eat a couple of crackers to avoid moving around on an empty stomach. °? Try eating starchy foods as these are usually tolerated well. Examples include cereal, toast, bread, potatoes, pasta, rice, and pretzels. °? Include at least 1 serving of protein with your meals and snacks. Protein options include   lean meats, poultry, seafood, beans, nuts, nut butters, eggs, cheese, and yogurt. °? Try eating a protein-rich snack before bed. Examples of a protein-rick snack include cheese and crackers or a peanut butter sandwich made with 1 slice of whole-wheat bread and 1 tsp (5 g) of peanut butter. °? Eat or suck on things that have ginger in them.  It may help relieve nausea. Add ¼ tsp ground ginger to hot tea or choose ginger tea. °? Try drinking 100% fruit juice or an electrolyte drink. An electrolyte drink contains sodium, potassium, and chloride. °? Drink fluids that are cold, clear, and carbonated or sour. Examples include lemonade, ginger ale, lemon-lime soda, ice water, and sparkling water. °? Brush your teeth or use a mouth rinse after meals. °? Talk with your health care provider about starting a supplement of vitamin B6. °General instructions °· Take over-the-counter and prescription medicines only as told by your health care provider. °· Follow instructions from your health care provider about eating or drinking restrictions. °· Continue to take your prenatal vitamins as told by your health care provider. If you are having trouble taking your prenatal vitamins, talk with your health care provider about different options. °· Keep all follow-up and pre-birth (prenatal) visits as told by your health care provider. This is important. °Contact a health care provider if: °· You have pain in your abdomen. °· You have a severe headache. °· You have vision problems. °· You are losing weight. °· You feel weak or dizzy. °Get help right away if: °· You cannot drink fluids without vomiting. °· You vomit blood. °· You have constant nausea and vomiting. °· You are very weak. °· You faint. °· You have a fever and your symptoms suddenly get worse. °Summary °· Hyperemesis gravidarum is a severe form of nausea and vomiting that happens during pregnancy. °· Making some changes to your eating habits may help relieve nausea and vomiting. °· This condition may be managed with medicine. °· If medicines do not help relieve nausea and vomiting, you may need to receive fluids through an IV at the hospital. °This information is not intended to replace advice given to you by your health care provider. Make sure you discuss any questions you have with your health care  provider. °Document Released: 05/09/2005 Document Revised: 05/29/2017 Document Reviewed: 01/06/2016 °Elsevier Patient Education © 2020 [employer] ° ° °

## 2019-05-07 ENCOUNTER — Ambulatory Visit (HOSPITAL_COMMUNITY): Payer: 59 | Admitting: *Deleted

## 2019-05-07 ENCOUNTER — Ambulatory Visit (HOSPITAL_COMMUNITY)
Admission: RE | Admit: 2019-05-07 | Discharge: 2019-05-07 | Disposition: A | Discharge status: Home / Self Care | Payer: 59 | Source: Ambulatory Visit | Attending: Obstetrics and Gynecology | Admitting: Obstetrics and Gynecology

## 2019-05-07 ENCOUNTER — Encounter (HOSPITAL_COMMUNITY): Payer: Self-pay

## 2019-05-07 ENCOUNTER — Other Ambulatory Visit: Payer: Self-pay

## 2019-05-07 DIAGNOSIS — O10012 Pre-existing essential hypertension complicating pregnancy, second trimester: Secondary | ICD-10-CM | POA: Diagnosis not present

## 2019-05-07 DIAGNOSIS — Z362 Encounter for other antenatal screening follow-up: Secondary | ICD-10-CM | POA: Insufficient documentation

## 2019-05-07 DIAGNOSIS — Z3A23 23 weeks gestation of pregnancy: Secondary | ICD-10-CM

## 2019-05-07 DIAGNOSIS — Z3480 Encounter for supervision of other normal pregnancy, unspecified trimester: Secondary | ICD-10-CM

## 2019-05-08 ENCOUNTER — Other Ambulatory Visit (HOSPITAL_COMMUNITY): Payer: Self-pay | Admitting: *Deleted

## 2019-05-08 DIAGNOSIS — O10919 Unspecified pre-existing hypertension complicating pregnancy, unspecified trimester: Secondary | ICD-10-CM

## 2019-05-20 ENCOUNTER — Telehealth (INDEPENDENT_AMBULATORY_CARE_PROVIDER_SITE_OTHER): Payer: 59 | Admitting: Obstetrics & Gynecology

## 2019-05-20 VITALS — BP 119/71 | HR 94 | Wt 209.0 lb

## 2019-05-20 DIAGNOSIS — O10919 Unspecified pre-existing hypertension complicating pregnancy, unspecified trimester: Secondary | ICD-10-CM

## 2019-05-20 DIAGNOSIS — Z3A25 25 weeks gestation of pregnancy: Secondary | ICD-10-CM

## 2019-05-20 DIAGNOSIS — O210 Mild hyperemesis gravidarum: Secondary | ICD-10-CM

## 2019-05-20 DIAGNOSIS — O10912 Unspecified pre-existing hypertension complicating pregnancy, second trimester: Secondary | ICD-10-CM

## 2019-05-20 DIAGNOSIS — Z3480 Encounter for supervision of other normal pregnancy, unspecified trimester: Secondary | ICD-10-CM

## 2019-05-20 NOTE — Progress Notes (Signed)
S/w patient for virtual visit, patient reports fetal movement with contractions that occur 3-4 times a week, denies bleeding. Pt states that she was advised to drink water and lay on left side,pt states that this helps.

## 2019-05-20 NOTE — Patient Instructions (Signed)

## 2019-05-20 NOTE — Progress Notes (Signed)
   TELEHEALTH VIRTUAL OBSTETRICS VISIT ENCOUNTER NOTE  I connected with Carla [name] on 05/20/19 at  9:15 AM EST by telephone at home and verified that I am speaking with the correct person using two identifiers.   I discussed the limitations, risks, security and privacy concerns of performing an evaluation and management service by telephone and the availability of in person appointments. I also discussed with the patient that there may be a patient responsible charge related to this service. The patient expressed understanding and agreed to proceed.  Subjective:  Carla [name] is a 28 y.o. [phone] at 104w0d being followed for ongoing prenatal care.  She is currently monitored for the following issues for this high-risk pregnancy and has Obesity affecting pregnancy, antepartum; Hyperemesis affecting pregnancy, antepartum; Supervision of other normal pregnancy, antepartum; and Chronic hypertension affecting pregnancy on their problem list.  Patient reports no complaints. Reports fetal movement. Denies any contractions, bleeding or leaking of fluid.   The following portions of the patient's history were reviewed and updated as appropriate: allergies, current medications, past family history, past medical history, past social history, past surgical history and problem list.   Objective:   General:  Alert, oriented and cooperative.   Mental Status: Normal mood and affect perceived. Normal judgment and thought content.  Rest of physical exam deferred due to type of encounter  Assessment and Plan:  Pregnancy: [id number] at [redacted]w[redacted]d 1. Supervision of other normal pregnancy, antepartum N&V not resolved, continue present management and f/u for growth [place]  Preterm labor symptoms and general obstetric precautions including but not limited to vaginal bleeding, contractions, leaking of fluid and fetal movement were reviewed in detail with the patient.  I discussed the assessment and treatment plan with  the patient. The patient was provided an opportunity to ask questions and all were answered. The patient agreed with the plan and demonstrated an understanding of the instructions. The patient was advised to call back or seek an in-person office evaluation/go to [facility]U at [facility] for any urgent or concerning symptoms. Please refer to After Visit Summary for other counseling recommendations.   I provided 10 minutes of non-face-to-face time during this encounter.  Return in about 2 weeks (around 06/03/2019) for 2 hr gtt.  Future Appointments  Date Time Provider [place]  06/04/2019 11:30 AM [place] [facility]-US  06/04/2019 11:30 AM [facility]-MFC [place] 5 [facility]-M[facility]US MFC-US    Emeterio Reeve, [place] [facility], [place]

## 2019-05-24 NOTE — L&D Delivery Note (Signed)
Delivery Note At 1459 a viable female infant was delivered via SVD, presentation: OA. APGAR: 8, 9; weight pending.   Placenta status: spontaneously delivered intact with gentle cord traction. Fundus firm with massage and Pitocin.   Anesthesia: epidural Lacerations: superficial labial- hemostatic Est. Blood Loss (mL): 108 Placenta to LD Complications nuchal x1, reduced Cord ph n/a   Mom to postpartum. Baby to Couplet care / Skin to Skin.    Donette Larry, CNM 08/19/2019 3:17 PM

## 2019-06-03 ENCOUNTER — Ambulatory Visit (INDEPENDENT_AMBULATORY_CARE_PROVIDER_SITE_OTHER): Payer: 59 | Admitting: Obstetrics and Gynecology

## 2019-06-03 ENCOUNTER — Encounter: Payer: Self-pay | Admitting: Obstetrics and Gynecology

## 2019-06-03 ENCOUNTER — Other Ambulatory Visit: Payer: Self-pay

## 2019-06-03 ENCOUNTER — Other Ambulatory Visit: Payer: 59

## 2019-06-03 VITALS — BP 110/68 | HR 91 | Wt 214.0 lb

## 2019-06-03 DIAGNOSIS — O99212 Obesity complicating pregnancy, second trimester: Secondary | ICD-10-CM

## 2019-06-03 DIAGNOSIS — O10919 Unspecified pre-existing hypertension complicating pregnancy, unspecified trimester: Secondary | ICD-10-CM

## 2019-06-03 DIAGNOSIS — O10912 Unspecified pre-existing hypertension complicating pregnancy, second trimester: Secondary | ICD-10-CM

## 2019-06-03 DIAGNOSIS — Z3480 Encounter for supervision of other normal pregnancy, unspecified trimester: Secondary | ICD-10-CM | POA: Diagnosis not present

## 2019-06-03 DIAGNOSIS — O99210 Obesity complicating pregnancy, unspecified trimester: Secondary | ICD-10-CM

## 2019-06-03 DIAGNOSIS — Z3A27 27 weeks gestation of pregnancy: Secondary | ICD-10-CM

## 2019-06-03 NOTE — Progress Notes (Signed)
   PRENATAL VISIT NOTE  Subjective:  Carla [name] is a 29 y.o. [phone] at [redacted]w[redacted]d being seen today for ongoing prenatal care.  She is currently monitored for the following issues for this high-risk pregnancy and has Obesity affecting pregnancy, antepartum; Hyperemesis affecting pregnancy, antepartum; Supervision of other normal pregnancy, antepartum; and Chronic hypertension affecting pregnancy on their problem list.  Patient reports no complaints.  Contractions: Irregular. Vag. Bleeding: None.  Movement: Present. Denies leaking of fluid.   The following portions of the patient's history were reviewed and updated as appropriate: allergies, current medications, past family history, past medical history, past social history, past surgical history and problem list.   Objective:   Vitals:   06/03/19 0942  BP: 110/68  Pulse: 91  Weight: 214 lb (97.1 kg)    Fetal Status: Fetal Heart Rate (bpm): 135   Movement: Present     General:  Alert, oriented and cooperative. Patient is in no acute distress.  Skin: Skin is warm and dry. No rash noted.   Cardiovascular: Normal heart rate noted  Respiratory: Normal respiratory effort, no problems with respiration noted  Abdomen: Soft, gravid, appropriate for gestational age.  Pain/Pressure: Absent     Pelvic: Cervical exam deferred        Extremities: Normal range of motion.     Mental Status: Normal mood and affect. Normal behavior. Normal judgment and thought content.   Assessment and Plan:  Pregnancy: [id number] at [redacted]w[redacted]d 1. Supervision of other normal pregnancy, antepartum Patient is doing well with persistent nausea/vomiting in pregnancy Patient is able to get some calories via protein shakes and smoothies Third trimester labs today  2. Chronic hypertension affecting pregnancy Normotensive without meds Continue ASA Follow up growth ultrasound 06/04/19  3. Obesity affecting pregnancy, antepartum   Preterm labor symptoms and general obstetric  precautions including but not limited to vaginal bleeding, contractions, leaking of fluid and fetal movement were reviewed in detail with the patient. Please refer to After Visit Summary for other counseling recommendations.   No follow-ups on file.  Future Appointments  Date Time Provider Department Center  06/03/2019 10:45 AM Munirah Doerner, Gigi Gin, MD [facility] None  06/04/2019 11:30 AM [facility] NURSE [facility] MFC-US  06/04/2019 11:30 AM [facility] [place] 5 [facility] MFC-US    Catalina Antigua, MD

## 2019-06-04 ENCOUNTER — Encounter (HOSPITAL_COMMUNITY): Payer: Self-pay

## 2019-06-04 ENCOUNTER — Ambulatory Visit (HOSPITAL_COMMUNITY): Payer: 59 | Admitting: *Deleted

## 2019-06-04 ENCOUNTER — Ambulatory Visit (HOSPITAL_COMMUNITY)
Admission: RE | Admit: 2019-06-04 | Discharge: 2019-06-04 | Disposition: A | Discharge status: Home / Self Care | Payer: 59 | Source: Ambulatory Visit | Attending: Obstetrics | Admitting: Obstetrics

## 2019-06-04 ENCOUNTER — Other Ambulatory Visit (HOSPITAL_COMMUNITY): Payer: Self-pay | Admitting: *Deleted

## 2019-06-04 VITALS — BP 117/73 | HR 84 | Temp 97.3°F

## 2019-06-04 DIAGNOSIS — O99212 Obesity complicating pregnancy, second trimester: Secondary | ICD-10-CM

## 2019-06-04 DIAGNOSIS — Z3A27 27 weeks gestation of pregnancy: Secondary | ICD-10-CM

## 2019-06-04 DIAGNOSIS — O0990 Supervision of high risk pregnancy, unspecified, unspecified trimester: Secondary | ICD-10-CM

## 2019-06-04 DIAGNOSIS — O10919 Unspecified pre-existing hypertension complicating pregnancy, unspecified trimester: Secondary | ICD-10-CM | POA: Insufficient documentation

## 2019-06-04 DIAGNOSIS — O210 Mild hyperemesis gravidarum: Secondary | ICD-10-CM

## 2019-06-04 DIAGNOSIS — O10012 Pre-existing essential hypertension complicating pregnancy, second trimester: Secondary | ICD-10-CM | POA: Diagnosis not present

## 2019-06-04 DIAGNOSIS — Z3480 Encounter for supervision of other normal pregnancy, unspecified trimester: Secondary | ICD-10-CM

## 2019-06-04 LAB — HIV ANTIBODY (ROUTINE TESTING W REFLEX): HIV Screen 4th Generation wRfx: NONREACTIVE

## 2019-06-04 LAB — RPR: RPR Ser Ql: NONREACTIVE

## 2019-06-04 LAB — CBC
Hematocrit: 31.7 % — ABNORMAL LOW (ref 34.0–46.6)
Hemoglobin: 10.4 g/dL — ABNORMAL LOW (ref 11.1–15.9)
MCH: 29.3 pg (ref 26.6–33.0)
MCHC: 32.8 g/dL (ref 31.5–35.7)
MCV: 89 fL (ref 79–97)
Platelets: 313 10*3/uL (ref 150–450)
RBC: 3.55 x10E6/uL — ABNORMAL LOW (ref 3.77–5.28)
RDW: 13.4 % (ref 11.7–15.4)
WBC: 13.1 10*3/uL — ABNORMAL HIGH (ref 3.4–10.8)

## 2019-06-04 LAB — GLUCOSE TOLERANCE, 2 HOURS W/ 1HR
Glucose, 1 hour: 111 mg/dL (ref 65–179)
Glucose, 2 hour: 103 mg/dL (ref 65–152)
Glucose, Fasting: 77 mg/dL (ref 65–91)

## 2019-06-17 ENCOUNTER — Encounter: Payer: Self-pay | Admitting: Obstetrics and Gynecology

## 2019-06-17 ENCOUNTER — Telehealth (INDEPENDENT_AMBULATORY_CARE_PROVIDER_SITE_OTHER): Payer: 59 | Admitting: Obstetrics and Gynecology

## 2019-06-17 VITALS — BP 124/88 | HR 92 | Wt 211.0 lb

## 2019-06-17 DIAGNOSIS — Z3A29 29 weeks gestation of pregnancy: Secondary | ICD-10-CM

## 2019-06-17 DIAGNOSIS — O99213 Obesity complicating pregnancy, third trimester: Secondary | ICD-10-CM

## 2019-06-17 DIAGNOSIS — O99210 Obesity complicating pregnancy, unspecified trimester: Secondary | ICD-10-CM

## 2019-06-17 DIAGNOSIS — O10913 Unspecified pre-existing hypertension complicating pregnancy, third trimester: Secondary | ICD-10-CM

## 2019-06-17 DIAGNOSIS — O10919 Unspecified pre-existing hypertension complicating pregnancy, unspecified trimester: Secondary | ICD-10-CM

## 2019-06-17 DIAGNOSIS — Z3480 Encounter for supervision of other normal pregnancy, unspecified trimester: Secondary | ICD-10-CM

## 2019-06-17 NOTE — Progress Notes (Signed)
Pt states she is still having some N&V with medication use. Pt states having a few ctx per week. Pt states she has been having HA's x 1+week, denies visual changes.

## 2019-06-17 NOTE — Progress Notes (Signed)
TELEHEALTH OBSTETRICS PRENATAL VIRTUAL VIDEO VISIT ENCOUNTER NOTE  [facility]   I connected with Carla Ihaindy L Beehler on 06/17/19 at  9:45 AM EST by MyChart Video Encounter at home and verified that I am speaking with the correct person using two identifiers.   I discussed the limitations, risks, security and privacy concerns of performing an evaluation and management service virtually and the availability of in person appointments. I also discussed with the patient that there may be a patient responsible charge related to this service. The patient expressed understanding and agreed to proceed. Subjective:  Carla IhaCindy L Dubow is a 29 y.o. [phone] at 3950w0d being seen today for ongoing prenatal care.  She is currently monitored for the following issues for this high-risk pregnancy and has Obesity affecting pregnancy, antepartum; Hyperemesis affecting pregnancy, antepartum; Supervision of other normal pregnancy, antepartum; and Chronic hypertension affecting pregnancy on their problem list.  Patient reports persistent nausea and recent headache. Patient is currently quarantine as her daughter recently tested positive for covid. Patient is without symptoms.  Contractions: Irregular.  .  Movement: Present. Denies any leaking of fluid.   The following portions of the patient's history were reviewed and updated as appropriate: allergies, current medications, past family history, past medical history, past social history, past surgical history and problem list.   Objective:   Vitals:   06/17/19 0848 06/17/19 0904  BP: (!) 143/87 124/88  Pulse: 95 92  Weight: 211 lb (95.7 kg)     Fetal Status:     Movement: Present     General:  Alert, oriented and cooperative. Patient is in no acute distress.  Respiratory: Normal respiratory effort, no problems with respiration noted  Mental Status: Normal mood and affect. Normal behavior. Normal judgment and thought  content.  Rest of physical exam deferred due to type of encounter  Imaging: US MFM OB FOLLOW UP  Result Date: 06/04/2019 ----------------------------------------------------------------------  OBSTETRICS REPORT                       (Signed Final 06/04/2019 12:33 pm) ---------------------------------------------------------------------- Patient Info  ID #:       [id number]                          D.O.B.:  03-15-1991 (28 yrs)  Name:       Carla [name]                 Visit Date: 06/04/2019 11:55 am ---------------------------------------------------------------------- Performed By  Performed By:     Emeline DarlingKasie E Kiser BS,      Ref. Address:     [address]                    [address]                                                             [address]  [address]                                                             [place]                                                             [address]  Attending:        Noralee Space MD        [facility]  Referred By:      [facility] ---------------------------------------------------------------------- Orders   #  Description                          Code         Ordered By   1  [place] MFM OB FOLLOW UP                  [id number]     YU FANG  ----------------------------------------------------------------------   #  Order #                    Accession #                 Episode #   1  [id number]                  [id number]                  [id number]  ---------------------------------------------------------------------- Indications   Obesity complicating pregnancy, second         O99.212   trimester (BMI 36)   Encounter for other antenatal screening        Z36.2   follow-up   Hypertension - Chronic/Pre-existing (No        O10.019   Meds)   Hyperemesis gravidarum                         O21.0   [redacted] weeks gestation of  pregnancy                Z3A.27  ---------------------------------------------------------------------- Vital Signs  Weight (lb): 214                               Height:        5'4"  BMI:         36.73 ---------------------------------------------------------------------- Fetal Evaluation  Num Of Fetuses:         1  Fetal Heart Rate(bpm):  141  Cardiac Activity:       Observed  Presentation:  Cephalic  Placenta:               Anterior  P. Cord Insertion:      Previously Visualized  Amniotic Fluid  AFI FV:      Within normal limits                              Largest Pocket(cm)                              6.9 ---------------------------------------------------------------------- Biometry  BPD:      68.9  mm     G. Age:  27w 5d         58  %    CI:        76.49   %    70 - 86                                                          FL/HC:      20.6   %    18.6 - 20.4  HC:      249.6  mm     G. Age:  27w 1d         21  %    HC/AC:      1.09        1.05 - 1.21  AC:      229.2  mm     G. Age:  27w 2d         46  %    FL/BPD:     74.7   %    71 - 87  FL:       51.5  mm     G. Age:  27w 4d         47  %    FL/AC:      22.5   %    20 - 24  Est. FW:    1066  gm      2 lb 6 oz     47  % ---------------------------------------------------------------------- OB History  Gravidity:    8         Term:   2        Prem:   0        SAB:   5  TOP:          0       Ectopic:  0        Living: 2 ---------------------------------------------------------------------- Gestational Age  LMP:           27w 1d        Date:  11/26/18                 EDD:   09/02/19  U/S Today:     27w 3d                                        EDD:   08/31/19  Best:          27w 1d     Det. By:  LMP  (11/26/18)  EDD:   09/02/19 ---------------------------------------------------------------------- Anatomy  Cranium:               Previously seen        LVOT:                   Previously seen  Cavum:                 Previously seen         Aortic Arch:            Previously seen  Ventricles:            Appears normal         Ductal Arch:            Previously seen  Choroid Plexus:        Previously seen        Diaphragm:              Previously seen  Cerebellum:            Previously seen        Stomach:                Appears normal, left                                                                        sided  Posterior Fossa:       Previously seen        Abdomen:                Appears normal  Nuchal Fold:           Previously seen        Abdominal Wall:         Previously seen  Face:                  Orbits and profile     Cord Vessels:           Previously seen                         previously seen  Lips:                  Previously seen        Kidneys:                Appear normal  Palate:                Previously seen        Bladder:                Appears normal  Thoracic:              Previously seen        Spine:                  Appears normal  Heart:                 Appears normal         Upper Extremities:      Previously seen                         (  4CH, axis, and                         situs)  RVOT:                  Previously seen        Lower Extremities:      Previously seen  Other:  Technically difficult due to fetal position. ---------------------------------------------------------------------- Cervix Uterus Adnexa  Cervix  Not visualized (advanced GA >24wks) ---------------------------------------------------------------------- Impression  Fetal growth is appropriate for gestational age. Amniotic fluid  is normal and good fetal activity is seen.  Chronic hypertension. Well-controlled without  antihypertensives.  BP at our office: 117/73 mm Hg. ---------------------------------------------------------------------- Recommendations  -An appointment was made for her to return in 5 weeks for  fetal growth assessment. ----------------------------------------------------------------------                  Noralee Space, MD  Electronically Signed Final Report   06/04/2019 12:33 pm ----------------------------------------------------------------------   Assessment and Plan:  Pregnancy: [id number] at [redacted]w[redacted]d 1. Supervision of other normal pregnancy, antepartum Patient is doing well. Encouraged patient to consume liquids.   2. Chronic hypertension affecting pregnancy Normotensive without medication Continue ASA Follow up growth 2/16  3. Obesity affecting pregnancy, antepartum   Preterm labor symptoms and general obstetric precautions including but not limited to vaginal bleeding, contractions, leaking of fluid and fetal movement were reviewed in detail with the patient. I discussed the assessment and treatment plan with the patient. The patient was provided an opportunity to ask questions and all were answered. The patient agreed with the plan and demonstrated an understanding of the instructions. The patient was advised to call back or seek an in-person office evaluation/go to [facility]U at [facility] for any urgent or concerning symptoms. Please refer to After Visit Summary for other counseling recommendations.   I provided 15 minutes of face-to-face time during this encounter.  No follow-ups on file.  Future Appointments  Date Time Provider Department Center  06/17/2019  9:45 AM Kahmari Herard, Gigi Gin, MD [facility] None  07/09/2019 10:30 AM [facility] NURSE [facility] MFC-US  07/09/2019 10:30 AM [facility]MFC [place] 5 WH-MFCUS MFC-US    Catalina Antigua, MD [facility], [facility]

## 2019-06-24 ENCOUNTER — Encounter (HOSPITAL_COMMUNITY): Payer: Self-pay | Admitting: Obstetrics and Gynecology

## 2019-06-24 ENCOUNTER — Inpatient Hospital Stay (HOSPITAL_COMMUNITY)
Admission: AD | Admit: 2019-06-24 | Discharge: 2019-06-24 | Disposition: A | Discharge status: Home / Self Care | Payer: 59 | Attending: Obstetrics and Gynecology | Admitting: Obstetrics and Gynecology

## 2019-06-24 ENCOUNTER — Other Ambulatory Visit: Payer: Self-pay

## 2019-06-24 DIAGNOSIS — Z3A30 30 weeks gestation of pregnancy: Secondary | ICD-10-CM | POA: Insufficient documentation

## 2019-06-24 DIAGNOSIS — R824 Acetonuria: Secondary | ICD-10-CM | POA: Insufficient documentation

## 2019-06-24 DIAGNOSIS — Z3480 Encounter for supervision of other normal pregnancy, unspecified trimester: Secondary | ICD-10-CM

## 2019-06-24 DIAGNOSIS — Z3689 Encounter for other specified antenatal screening: Secondary | ICD-10-CM | POA: Diagnosis not present

## 2019-06-24 DIAGNOSIS — O212 Late vomiting of pregnancy: Secondary | ICD-10-CM | POA: Diagnosis not present

## 2019-06-24 LAB — COMPREHENSIVE METABOLIC PANEL
ALT: 14 U/L (ref 0–44)
AST: 14 U/L — ABNORMAL LOW (ref 15–41)
Albumin: 2.8 g/dL — ABNORMAL LOW (ref 3.5–5.0)
Alkaline Phosphatase: 67 U/L (ref 38–126)
Anion gap: 10 (ref 5–15)
BUN: 5 mg/dL — ABNORMAL LOW (ref 6–20)
CO2: 20 mmol/L — ABNORMAL LOW (ref 22–32)
Calcium: 8.3 mg/dL — ABNORMAL LOW (ref 8.9–10.3)
Chloride: 106 mmol/L (ref 98–111)
Creatinine, Ser: 0.52 mg/dL (ref 0.44–1.00)
GFR calc Af Amer: >60 mL/min (ref >60)
GFR calc non Af Amer: >60 mL/min (ref >60)
Glucose, Bld: 88 mg/dL (ref 70–99)
Potassium: 3.5 mmol/L (ref 3.5–5.1)
Sodium: 136 mmol/L (ref 135–145)
Total Bilirubin: 0.6 mg/dL (ref 0.3–1.2)
Total Protein: 5.8 g/dL — ABNORMAL LOW (ref 6.5–8.1)

## 2019-06-24 LAB — URINALYSIS, ROUTINE W REFLEX MICROSCOPIC
Bilirubin Urine: NEGATIVE
Glucose, UA: NEGATIVE mg/dL
Hgb urine dipstick: NEGATIVE
Ketones, ur: 20 mg/dL — AB
Leukocytes,Ua: NEGATIVE
Nitrite: NEGATIVE
Protein, ur: 30 mg/dL — AB
Specific Gravity, Urine: 1.031 — ABNORMAL HIGH (ref 1.005–1.030)
pH: 6 (ref 5.0–8.0)

## 2019-06-24 MED ORDER — LACTATED RINGERS IV BOLUS
1000.0000 mL | Freq: Once | INTRAVENOUS | Status: AC
  Administered 2019-06-24: 1000 mL via INTRAVENOUS

## 2019-06-24 NOTE — Discharge Instructions (Signed)

## 2019-06-24 NOTE — MAU Note (Signed)
Ongoing vomiting.  Has been taken out of work due to vomiting.  Reports 8# wt loss in 2 wks.  ongoing HA for 3 wks. Has had mild contractions.

## 2019-06-24 NOTE — MAU Provider Note (Addendum)
History     CSN: [id number]  Arrival date and time: 06/24/19 1518   First Provider Initiated Contact with Patient 06/24/19 1604      Chief Complaint  Patient presents with  . Emesis   HPI TAKERRA LUPINACCI is a 29 y.o. [id number] at 75w0dwho presents to MAU with chief complaint of nausea and vomiting. This is a recurrent problem which patient has experienced for her entire pregnancy. She states she was managing well and had reached a baseline of three episodes of vomiting each day. However, within the past 2-3 weeks she has experienced a resurgence of vomiting, often vomiting 7-8 times per day. She has multiple medications which she has arranged into a daily schedule to minimize episodes of vomiting.   She denies vaginal bleeding, leaking of fluid, decreased fetal movement, fever, falls, or recent illness.   OB History    Gravida  8   Para  2   Term  2   Preterm      AB  5   Living  2     SAB  5   TAB      Ectopic      Multiple  0   Live Births  2        Obstetric Comments  Patient has had multiple miscarriages- she has had 2 recent SAB. The most recent is reported 1 1/2 months ago. Pt states she may have had up to 8 pregnancies, has had multiple SAB- lost count.        Past Medical History:  Diagnosis Date  . Allergy   . Anemia   . Depression    postpartum  . Headache(784.0)   . PONV (postoperative nausea and vomiting)   . Pregnancy induced hypertension   . Spontaneous abortion 12/03/2010    Past Surgical History:  Procedure Laterality Date  . CHOLECYSTECTOMY N/A 05/13/2015   Procedure: LAPAROSCOPIC CHOLECYSTECTOMY ;  Surgeon: MDonnie Mesa MD;  [facility]  Service: General;  Laterality: N/A;  . DILATION AND EVACUATION  12/03/2010   Procedure: DILATATION AND EVACUATION (D&E);  Surgeon: LAgnes Lawrence MD;  [facility];  Service: Gynecology;  Laterality: N/A;  . OVARIAN CYST REMOVAL    . TONSILLECTOMY      Family History  Problem  Relation Age of Onset  . Arthritis Mother   . Cancer Mother   . Diabetes Mother   . Hearing loss Mother   . Hypertension Mother   . Obesity Mother   . Hypertension Father   . Stroke Father   . Hypertension Other   . Diabetes Other   . Stroke Other   . Cancer Other   . Heart failure Other   . Seizures Other   . ADD / ADHD Brother   . Asthma Brother   . Learning disabilities Brother   . Alcohol abuse Paternal Aunt   . Alcohol abuse Paternal Uncle   . COPD Maternal Grandmother   . Cancer Maternal Grandfather     Social History   Tobacco Use  . Smoking status: Never Smoker  . Smokeless tobacco: Never Used  Substance Use Topics  . Alcohol use: No    Alcohol/week: 0.0 standard drinks    Comment: Ocassionally  . Drug use: No    Allergies:  Allergies  Allergen Reactions  . Shellfish Allergy Anaphylaxis  . Latex Itching    Medications Prior to Admission  Medication Sig Dispense Refill Last Dose  . aspirin EC 81 MG  tablet Take 1 tablet (81 mg total) by mouth daily. Take after 12 weeks for prevention of preeclampsia later in pregnancy 300 tablet 2 06/24/2019 at Unknown time  . doxylamine, Sleep, (UNISOM) 25 MG tablet Take 1 tablet (25 mg total) by mouth 2 (two) times daily at 8 am and 10 pm. 60 tablet 6 06/24/2019 at Unknown time  . famotidine (PEPCID) 20 MG tablet Take 1 tablet (20 mg total) by mouth 2 (two) times daily. 30 tablet 0 06/24/2019 at Unknown time  . metoCLOPramide (REGLAN) 10 MG tablet TAKE 1 TABLET BY MOUTH EVERY 8 HOURS AS NEEDED FOR NAUSEA AND VOMITING 30 tablet 5 06/24/2019 at Unknown time  . ondansetron (ZOFRAN-ODT) 4 MG disintegrating tablet Take 1 tablet (4 mg total) by mouth every 6 (six) hours as needed. for nausea 20 tablet 3 06/24/2019 at Unknown time  . Prenatal Vit-Fe Fumarate-FA (PRENATAL VITAMIN PO) Take by mouth.   06/24/2019 at Unknown time  . pyridOXINE (VITAMIN B-6) 100 MG tablet Take 200 mg by mouth at bedtime.   06/24/2019 at Unknown time  . scopolamine  (TRANSDERM-SCOP) 1 MG/3DAYS Place 1 patch (1.5 mg total) onto the skin every 3 (three) days. 10 patch 12 Past Month at Unknown time  . Blood Pressure Monitoring (BLOOD PRESSURE KIT) DEVI 1 kit by Does not apply route once a week. CHECK BP WEEKLY.  LARGE CUFF. DX O09.0 1 kit 0   . cyclobenzaprine (FLEXERIL) 5 MG tablet TAKE 1 TABLET BY MOUTH THREE TIMES DAILY AS NEEDED FOR MUSCLE SPASMS (Patient not taking: No sig reported) 30 tablet 0   . pantoprazole (PROTONIX) 40 MG tablet Take 1 tablet (40 mg total) by mouth daily. 30 tablet 1     Review of Systems  Constitutional: Negative for chills, fatigue and fever.  Respiratory: Negative for shortness of breath.   Gastrointestinal: Positive for nausea and vomiting. Negative for abdominal pain.  Genitourinary: Negative for vaginal bleeding, vaginal discharge and vaginal pain.  Musculoskeletal: Negative for back pain.  Neurological: Negative for dizziness, syncope, weakness and headaches.  All other systems reviewed and are negative.  Physical Exam   Blood pressure 126/80, pulse 96, temperature 98.5 F (36.9 C), resp. rate 16, height '5\' 4"'$  (1.626 m), weight 95.4 kg, last menstrual period 11/26/2018, SpO2 98 %, currently breastfeeding.  Physical Exam  Nursing note and vitals reviewed. Constitutional: She is oriented to person, place, and time. Vital signs are normal. She appears well-developed and well-nourished.  Non-toxic appearance. She does not have a sickly appearance. She does not appear ill. No distress.  Cardiovascular: Normal rate.  Respiratory: Effort normal and breath sounds normal.  GI: Soft. She exhibits no distension. There is no abdominal tenderness. There is no rebound and no guarding.  Neurological: She is alert and oriented to person, place, and time.  Skin: Skin is warm and dry.  Psychiatric: She has a normal mood and affect. Her behavior is normal. Judgment and thought content normal.    MAU Course/MDM  Procedures    --Patient declines antiemetics in MAU due to wanting to preserve her home medication schedule --Weight 97.1kg on 01/11, 95.7kg on 01/25, 95.4 on 02/01 --Reactive tracing: baseline 135, mod variability, positive accels, no decels --Toco: quiet  Patient Vitals for the past 24 hrs:  BP Temp Pulse Resp SpO2 Height Weight  06/24/19 1755 119/72 -- 85 18 -- -- --  06/24/19 1542 126/80 98.5 F (36.9 C) 96 16 98 % '5\' 4"'$  (1.626 m) 95.4 kg   Orders  Placed This Encounter  Procedures  . Urinalysis, Routine w reflex microscopic    Standing Status:   Standing    Number of Occurrences:   1  . Comprehensive metabolic panel    Standing Status:   Standing    Number of Occurrences:   1  . Insert peripheral IV    Standing Status:   Standing    Number of Occurrences:   1  . Discharge patient    Order Specific Question:   Discharge disposition    Answer:   01-Home or Self Care [1]    Order Specific Question:   Discharge patient date    Answer:   06/24/2019   Results for orders placed or performed during the hospital encounter of 06/24/19 (from the past 24 hour(s))  Urinalysis, Routine w reflex microscopic     Status: Abnormal   Collection Time: 06/24/19  3:24 PM  Result Value Ref Range   Color, Urine YELLOW YELLOW   APPearance CLEAR CLEAR   Specific Gravity, Urine 1.031 (H) 1.005 - 1.030   pH 6.0 5.0 - 8.0   Glucose, UA NEGATIVE NEGATIVE mg/dL   Hgb urine dipstick NEGATIVE NEGATIVE   Bilirubin Urine NEGATIVE NEGATIVE   Ketones, ur 20 (A) NEGATIVE mg/dL   Protein, ur 30 (A) NEGATIVE mg/dL   Nitrite NEGATIVE NEGATIVE   Leukocytes,Ua NEGATIVE NEGATIVE   RBC / HPF 0-5 0 - 5 RBC/hpf   WBC, UA 0-5 0 - 5 WBC/hpf   Bacteria, UA RARE (A) NONE SEEN   Squamous Epithelial / LPF 6-10 0 - 5   Mucus PRESENT   Comprehensive metabolic panel     Status: Abnormal   Collection Time: 06/24/19  4:17 PM  Result Value Ref Range   Sodium 136 135 - 145 mmol/L   Potassium 3.5 3.5 - 5.1 mmol/L   Chloride 106 98  - 111 mmol/L   CO2 20 (L) 22 - 32 mmol/L   Glucose, Bld 88 70 - 99 mg/dL   BUN 5 (L) 6 - 20 mg/dL   Creatinine, Ser 0.52 0.44 - 1.00 mg/dL   Calcium 8.3 (L) 8.9 - 10.3 mg/dL   Total Protein 5.8 (L) 6.5 - 8.1 g/dL   Albumin 2.8 (L) 3.5 - 5.0 g/dL   AST 14 (L) 15 - 41 U/L   ALT 14 0 - 44 U/L   Alkaline Phosphatase 67 38 - 126 U/L   Total Bilirubin 0.6 0.3 - 1.2 mg/dL   GFR calc non Af Amer >60 >60 mL/min   GFR calc Af Amer >60 >60 mL/min   Anion gap 10 5 - 15   Meds ordered this encounter  Medications  . lactated ringers bolus 1,000 mL    Assessment and Plan  --29 y.o. [id number] at [redacted]w[redacted]d --Reactive tracing --Mild ketonuria --Weight stable --Discharge home in stable condition  F/U: --CElmo02/12/2019  SDarlina Rumpf CFairfax Station2/05/2019, 7:32 PM

## 2019-07-01 ENCOUNTER — Encounter: Payer: Self-pay | Admitting: Obstetrics and Gynecology

## 2019-07-01 ENCOUNTER — Telehealth (INDEPENDENT_AMBULATORY_CARE_PROVIDER_SITE_OTHER): Payer: 59 | Admitting: Obstetrics and Gynecology

## 2019-07-01 VITALS — BP 122/77 | HR 94 | Wt 210.0 lb

## 2019-07-01 DIAGNOSIS — O10913 Unspecified pre-existing hypertension complicating pregnancy, third trimester: Secondary | ICD-10-CM

## 2019-07-01 DIAGNOSIS — Z3480 Encounter for supervision of other normal pregnancy, unspecified trimester: Secondary | ICD-10-CM

## 2019-07-01 DIAGNOSIS — O99213 Obesity complicating pregnancy, third trimester: Secondary | ICD-10-CM

## 2019-07-01 DIAGNOSIS — Z3A31 31 weeks gestation of pregnancy: Secondary | ICD-10-CM

## 2019-07-01 DIAGNOSIS — O99210 Obesity complicating pregnancy, unspecified trimester: Secondary | ICD-10-CM

## 2019-07-01 DIAGNOSIS — O210 Mild hyperemesis gravidarum: Secondary | ICD-10-CM

## 2019-07-01 DIAGNOSIS — O10919 Unspecified pre-existing hypertension complicating pregnancy, unspecified trimester: Secondary | ICD-10-CM

## 2019-07-01 MED ORDER — PROMETHAZINE HCL 25 MG PO TABS: 25.0000 mg | ORAL_TABLET | Freq: Four times a day (QID) | ORAL | 2 refills | Status: DC | PRN

## 2019-07-01 NOTE — Progress Notes (Signed)
TELEHEALTH OBSTETRICS PRENATAL VIRTUAL VIDEO VISIT ENCOUNTER NOTE  Provider location: [facility] at [place]   I connected with Carla [name] on 07/01/19 at  9:45 AM EST by MyChart Video Encounter at home and verified that I am speaking with the correct person using two identifiers.   I discussed the limitations, risks, security and privacy concerns of performing an evaluation and management service virtually and the availability of in person appointments. I also discussed with the patient that there may be a patient responsible charge related to this service. The patient expressed understanding and agreed to proceed. Subjective:  Carla [name] is a 29 y.o. [phone] at [redacted]w[redacted]d being seen today for ongoing prenatal care.  She is currently monitored for the following issues for this high-risk pregnancy and has Obesity affecting pregnancy, antepartum; Hyperemesis affecting pregnancy, antepartum; Supervision of other normal pregnancy, antepartum; and Chronic hypertension affecting pregnancy on their problem list.  Patient reports nausea.  Contractions: Irregular. Vag. Bleeding: None.  Movement: Present. Denies any leaking of fluid.   The following portions of the patient's history were reviewed and updated as appropriate: allergies, current medications, past family history, past medical history, past social history, past surgical history and problem list.   Objective:   Vitals:   07/01/19 0857  BP: 122/77  Pulse: 94  Weight: 210 lb (95.3 kg)    Fetal Status:     Movement: Present     General:  Alert, oriented and cooperative. Patient is in no acute distress.  Respiratory: Normal respiratory effort, no problems with respiration noted  Mental Status: Normal mood and affect. Normal behavior. Normal judgment and thought content.  Rest of physical exam deferred due to type of encounter  Imaging: [place] MFM OB FOLLOW UP  Result Date:  06/04/2019 ----------------------------------------------------------------------  OBSTETRICS REPORT                       (Signed Final 06/04/2019 12:33 pm) ---------------------------------------------------------------------- Patient Info  ID #:       [id number]                          D.O.B.:  05-12-1991 (28 yrs)  Name:       Carla [name]                 Visit Date: 06/04/2019 11:55 am ---------------------------------------------------------------------- Performed By  Performed By:     Jeanene Erb BS,      Ref. Address:     [address]  [place]                                                             [address]  Attending:        Noralee Space MD        [facility]  Referred By:      [facility] ---------------------------------------------------------------------- Orders   #  Description                          Code         Ordered By   1  [place] MFM OB FOLLOW UP                  [phone]     YU FANG  ----------------------------------------------------------------------   #  Order #                    Accession #                 Episode #   1  [id number]                  [id number]                  [id number]  ---------------------------------------------------------------------- Indications   Obesity complicating pregnancy, second         O99.212   trimester (BMI 36)   Encounter for other antenatal screening        Z36.2   follow-up   Hypertension - Chronic/Pre-existing (No        O10.019   Meds)   Hyperemesis gravidarum                         O21.0   [redacted] weeks gestation of pregnancy                Z3A.27  ---------------------------------------------------------------------- Vital Signs  Weight  (lb): 214                               Height:        5'4"  BMI:         36.73 ---------------------------------------------------------------------- Fetal Evaluation  Num Of Fetuses:         1  Fetal Heart Rate(bpm):  141  Cardiac Activity:       Observed  Presentation:           Cephalic  Placenta:               Anterior  P. Cord Insertion:      Previously Visualized  Amniotic Fluid  AFI FV:      Within normal limits  Largest Pocket(cm)                              6.9 ---------------------------------------------------------------------- Biometry  BPD:      68.9  mm     G. Age:  27w 5d         58  %    CI:        76.49   %    70 - 86                                                          FL/HC:      20.6   %    18.6 - 20.4  HC:      249.6  mm     G. Age:  27w 1d         21  %    HC/AC:      1.09        1.05 - 1.21  AC:      229.2  mm     G. Age:  27w 2d         46  %    FL/BPD:     74.7   %    71 - 87  FL:       51.5  mm     G. Age:  27w 4d         47  %    FL/AC:      22.5   %    20 - 24  Est. FW:    1066  gm      2 lb 6 oz     47  % ---------------------------------------------------------------------- OB History  Gravidity:    8         Term:   2        Prem:   0        SAB:   5  TOP:          0       Ectopic:  0        Living: 2 ---------------------------------------------------------------------- Gestational Age  LMP:           27w 1d        Date:  11/26/18                 EDD:   09/02/19  U/S Today:     27w 3d                                        EDD:   08/31/19  Best:          27w 1d     Det. By:  LMP  (11/26/18)          EDD:   09/02/19 ---------------------------------------------------------------------- Anatomy  Cranium:               Previously seen        LVOT:                   Previously seen  Cavum:  Previously seen        Aortic Arch:            Previously seen  Ventricles:            Appears normal         Ductal Arch:            Previously seen   Choroid Plexus:        Previously seen        Diaphragm:              Previously seen  Cerebellum:            Previously seen        Stomach:                Appears normal, left                                                                        sided  Posterior Fossa:       Previously seen        Abdomen:                Appears normal  Nuchal Fold:           Previously seen        Abdominal Wall:         Previously seen  Face:                  Orbits and profile     Cord Vessels:           Previously seen                         previously seen  Lips:                  Previously seen        Kidneys:                Appear normal  Palate:                Previously seen        Bladder:                Appears normal  Thoracic:              Previously seen        Spine:                  Appears normal  Heart:                 Appears normal         Upper Extremities:      Previously seen                         (4CH, axis, and                         situs)  RVOT:                  Previously seen        Lower Extremities:  Previously seen  Other:  Technically difficult due to fetal position. ---------------------------------------------------------------------- Cervix Uterus Adnexa  Cervix  Not visualized (advanced GA >24wks) ---------------------------------------------------------------------- Impression  Fetal growth is appropriate for gestational age. Amniotic fluid  is normal and good fetal activity is seen.  Chronic hypertension. Well-controlled without  antihypertensives.  BP at our office: 117/73 mm Hg. ---------------------------------------------------------------------- Recommendations  -An appointment was made for her to return in 5 weeks for  fetal growth assessment. ----------------------------------------------------------------------                  Noralee Space, MD Electronically Signed Final Report   06/04/2019 12:33 pm  ----------------------------------------------------------------------   Assessment and Plan:  Pregnancy: [id number] at 101w0d 1. Supervision of other normal pregnancy, antepartum Patient with persistent nausea otherwise without complaints Rx phenergan to take every 6 hours provided  2. Chronic hypertension affecting pregnancy Normotensive without medication Continue ASA MFM scan next week   3. Obesity affecting pregnancy, antepartum   4. Hyperemesis affecting pregnancy, antepartum See above  Preterm labor symptoms and general obstetric precautions including but not limited to vaginal bleeding, contractions, leaking of fluid and fetal movement were reviewed in detail with the patient. I discussed the assessment and treatment plan with the patient. The patient was provided an opportunity to ask questions and all were answered. The patient agreed with the plan and demonstrated an understanding of the instructions. The patient was advised to call back or seek an in-person office evaluation/go to MAU at [facility] for any urgent or concerning symptoms. Please refer to After Visit Summary for other counseling recommendations.   I provided 11 minutes of face-to-face time during this encounter.  Return in about 2 weeks (around 07/15/2019) for in person, ROB, High risk.  Future Appointments  Date Time Provider Department Center  07/01/2019  9:45 AM Gaberial Cada, Gigi Gin, MD [facility] None  07/09/2019 10:30 AM [facility] NURSE [facility]  07/09/2019 10:30 AM [facility] [place] 5 [facility]-US    Catalina Antigua, MD [facility] [employer], [facility]

## 2019-07-09 ENCOUNTER — Ambulatory Visit (HOSPITAL_COMMUNITY)
Admission: RE | Admit: 2019-07-09 | Discharge: 2019-07-09 | Disposition: A | Discharge status: Home / Self Care | Payer: 59 | Source: Ambulatory Visit | Attending: Obstetrics and Gynecology | Admitting: Obstetrics and Gynecology

## 2019-07-09 ENCOUNTER — Ambulatory Visit (HOSPITAL_COMMUNITY): Payer: 59 | Admitting: *Deleted

## 2019-07-09 ENCOUNTER — Other Ambulatory Visit (HOSPITAL_COMMUNITY): Payer: Self-pay | Admitting: *Deleted

## 2019-07-09 ENCOUNTER — Other Ambulatory Visit: Payer: Self-pay

## 2019-07-09 ENCOUNTER — Encounter (HOSPITAL_COMMUNITY): Payer: Self-pay

## 2019-07-09 ENCOUNTER — Other Ambulatory Visit (HOSPITAL_COMMUNITY): Payer: Self-pay | Admitting: Obstetrics

## 2019-07-09 DIAGNOSIS — O210 Mild hyperemesis gravidarum: Secondary | ICD-10-CM

## 2019-07-09 DIAGNOSIS — Z3A32 32 weeks gestation of pregnancy: Secondary | ICD-10-CM

## 2019-07-09 DIAGNOSIS — O10919 Unspecified pre-existing hypertension complicating pregnancy, unspecified trimester: Secondary | ICD-10-CM | POA: Insufficient documentation

## 2019-07-09 DIAGNOSIS — Z3480 Encounter for supervision of other normal pregnancy, unspecified trimester: Secondary | ICD-10-CM

## 2019-07-09 DIAGNOSIS — Z362 Encounter for other antenatal screening follow-up: Secondary | ICD-10-CM

## 2019-07-09 DIAGNOSIS — O10013 Pre-existing essential hypertension complicating pregnancy, third trimester: Secondary | ICD-10-CM | POA: Diagnosis not present

## 2019-07-15 ENCOUNTER — Other Ambulatory Visit: Payer: Self-pay

## 2019-07-15 ENCOUNTER — Ambulatory Visit (INDEPENDENT_AMBULATORY_CARE_PROVIDER_SITE_OTHER): Payer: 59 | Admitting: Obstetrics and Gynecology

## 2019-07-15 ENCOUNTER — Encounter: Payer: Self-pay | Admitting: Obstetrics and Gynecology

## 2019-07-15 VITALS — BP 116/77 | HR 103 | Wt 210.0 lb

## 2019-07-15 DIAGNOSIS — Z3A33 33 weeks gestation of pregnancy: Secondary | ICD-10-CM

## 2019-07-15 DIAGNOSIS — Z3480 Encounter for supervision of other normal pregnancy, unspecified trimester: Secondary | ICD-10-CM

## 2019-07-15 DIAGNOSIS — O210 Mild hyperemesis gravidarum: Secondary | ICD-10-CM

## 2019-07-15 DIAGNOSIS — O10919 Unspecified pre-existing hypertension complicating pregnancy, unspecified trimester: Secondary | ICD-10-CM

## 2019-07-15 DIAGNOSIS — O99210 Obesity complicating pregnancy, unspecified trimester: Secondary | ICD-10-CM

## 2019-07-15 NOTE — Progress Notes (Signed)
   PRENATAL VISIT NOTE  Subjective:  Carla [name] is a 29 y.o. [phone] at [redacted]w[redacted]d being seen today for ongoing prenatal care.  She is currently monitored for the following issues for this high-risk pregnancy and has Obesity affecting pregnancy, antepartum; Hyperemesis affecting pregnancy, antepartum; Supervision of other normal pregnancy, antepartum; and Chronic hypertension affecting pregnancy on their problem list.  Patient reports persistent nausea and emesis.  Contractions: Irregular. Vag. Bleeding: None.  Movement: Present. Denies leaking of fluid.   The following portions of the patient's history were reviewed and updated as appropriate: allergies, current medications, past family history, past medical history, past social history, past surgical history and problem list.   Objective:   Vitals:   07/15/19 0949  BP: 116/77  Pulse: (!) 103  Weight: 210 lb (95.3 kg)    Fetal Status:     Movement: Present     General:  Alert, oriented and cooperative. Patient is in no acute distress.  Skin: Skin is warm and dry. No rash noted.   Cardiovascular: Normal heart rate noted  Respiratory: Normal respiratory effort, no problems with respiration noted  Abdomen: Soft, gravid, appropriate for gestational age.  Pain/Pressure: Present     Pelvic: Cervical exam deferred        Extremities: Normal range of motion.     Mental Status: Normal mood and affect. Normal behavior. Normal judgment and thought content.   Assessment and Plan:  Pregnancy: [id number] at [redacted]w[redacted]d 1. Supervision of other normal pregnancy, antepartum Patient is doing well with persistent nausea and emesis of pregnancy  2. Chronic hypertension affecting pregnancy Normotensive without medication Will continue ASA Antenatal testing with MFM already scheduled  3. Obesity affecting pregnancy, antepartum Stable weight  4. Hyperemesis affecting pregnancy, antepartum Will continue current regimen Patient states symptoms tend to  occur in the evening  Preterm labor symptoms and general obstetric precautions including but not limited to vaginal bleeding, contractions, leaking of fluid and fetal movement were reviewed in detail with the patient. Please refer to After Visit Summary for other counseling recommendations.   Return in about 2 weeks (around 07/29/2019) for Virtual, High risk, ROB.  Future Appointments  Date Time Provider Department Center  08/06/2019 10:45 AM [facility] NURSE [facility]-US  08/06/2019 10:45 AM [facility] [place] 2 WH-MFCUS M[facility]-US  08/13/2019 10:45 AM [facility] NURSE WH-MFC MFC-US  08/13/2019 10:45 AM WH-MFC [place] 2 WH-MFCUS MFC-US  08/20/2019 10:45 AM WH-M[facility] NURSE WH-MFC MFC-US  08/20/2019 10:45 AM WH-[facility] [place] 2 WH-MFCUS MFC-US    Catalina Antigua, MD

## 2019-07-29 ENCOUNTER — Encounter: Payer: Self-pay | Admitting: Obstetrics and Gynecology

## 2019-07-29 ENCOUNTER — Telehealth (INDEPENDENT_AMBULATORY_CARE_PROVIDER_SITE_OTHER): Payer: 59 | Admitting: Obstetrics and Gynecology

## 2019-07-29 VITALS — BP 115/75 | HR 91 | Wt 207.2 lb

## 2019-07-29 DIAGNOSIS — Z3480 Encounter for supervision of other normal pregnancy, unspecified trimester: Secondary | ICD-10-CM

## 2019-07-29 DIAGNOSIS — O99213 Obesity complicating pregnancy, third trimester: Secondary | ICD-10-CM

## 2019-07-29 DIAGNOSIS — O99210 Obesity complicating pregnancy, unspecified trimester: Secondary | ICD-10-CM

## 2019-07-29 DIAGNOSIS — O10913 Unspecified pre-existing hypertension complicating pregnancy, third trimester: Secondary | ICD-10-CM

## 2019-07-29 DIAGNOSIS — O210 Mild hyperemesis gravidarum: Secondary | ICD-10-CM

## 2019-07-29 DIAGNOSIS — Z3A35 35 weeks gestation of pregnancy: Secondary | ICD-10-CM

## 2019-07-29 DIAGNOSIS — O10919 Unspecified pre-existing hypertension complicating pregnancy, unspecified trimester: Secondary | ICD-10-CM

## 2019-07-29 NOTE — Progress Notes (Signed)
TELEHEALTH OBSTETRICS PRENATAL VIRTUAL VIDEO VISIT ENCOUNTER NOTE  Provider location: [facility] [place]   I connected with Carla [name] on 07/29/19 at  9:00 AM EST by MyChart Video Encounter at home and verified that I am speaking with the correct person using two identifiers.   I discussed the limitations, risks, security and privacy concerns of performing an evaluation and management service virtually and the availability of in person appointments. I also discussed with the patient that there may be a patient responsible charge related to this service. The patient expressed understanding and agreed to proceed. Subjective:  Carla [name] is a 29 y.o. [phone] at [redacted]w[redacted]d being seen today for ongoing prenatal care.  She is currently monitored for the following issues for this high-risk pregnancy and has Obesity affecting pregnancy, antepartum; Hyperemesis affecting pregnancy, antepartum; Supervision of other normal pregnancy, antepartum; and Chronic hypertension affecting pregnancy on their problem list.  Patient reports contractions since yesterday.  Contractions: Irregular. Vag. Bleeding: None.  Movement: Present. Denies any leaking of fluid.   The following portions of the patient's history were reviewed and updated as appropriate: allergies, current medications, past family history, past medical history, past social history, past surgical history and problem list.   Objective:   Vitals:   07/29/19 0827  BP: 115/75  Pulse: 91  Weight: 207 lb 3.2 oz (94 kg)    Fetal Status:     Movement: Present     General:  Alert, oriented and cooperative. Patient is in no acute distress.  Respiratory: Normal respiratory effort, no problems with respiration noted  Mental Status: Normal mood and affect. Normal behavior. Normal judgment and thought content.  Rest of physical exam deferred due to type of encounter  Imaging: [place] MFM OB FOLLOW UP  Result Date:  07/09/2019 ----------------------------------------------------------------------  OBSTETRICS REPORT                       (Signed Final 07/09/2019 11:14 am) ---------------------------------------------------------------------- Patient Info  ID #:       [id number]                          D.O.B.:  09/09/1990 (28 yrs)  Name:       Carla [name]                 Visit Date: 07/09/2019 10:43 am ---------------------------------------------------------------------- Performed By  Performed By:     Percell Boston          Ref. Address:     [address]                                                             [phone]  [place]                                                             [place]  Attending:        Johnell Comings MD         [facility]  Referred By:      [place] ---------------------------------------------------------------------- Orders   #  Description                          Code         Ordered By   1  [place] MFM OB FOLLOW UP                  [phone]     [facility]  ----------------------------------------------------------------------   #  Order #                    Accession #                 Episode #   1  [id number]                  [id number]                  [id number]  ---------------------------------------------------------------------- Indications   Hypertension - Chronic/Pre-existing (No        O10.019   Meds)   [redacted] weeks gestation of pregnancy                Z3A.32   Encounter for other antenatal screening        Z36.2   follow-up   Hyperemesis gravidarum                         O21.0  ---------------------------------------------------------------------- Vital Signs                                                 Height:        5'4"  ---------------------------------------------------------------------- Fetal Evaluation  Num Of Fetuses:         1  Fetal Heart Rate(bpm):  133  Cardiac Activity:       Observed  Presentation:           Cephalic  Placenta:               Anterior  P. Cord Insertion:      Previously Visualized  Amniotic Fluid  AFI FV:      Within normal limits  AFI Sum(cm)     %Tile       Largest Pocket(cm)  16.73           61  5.35  RUQ(cm)       RLQ(cm)       LUQ(cm)        LLQ(cm)  3.02          5.35          4.91           3.45 ---------------------------------------------------------------------- Biometry  BPD:        79  mm     G. Age:  31w 5d         28  %    CI:        71.83   %    70 - 86                                                          FL/HC:      20.5   %    19.1 - 21.3  HC:      296.7  mm     G. Age:  32w 6d         30  %    HC/AC:      1.03        0.96 - 1.17  AC:      287.8  mm     G. Age:  32w 6d         68  %    FL/BPD:     77.0   %    71 - 87  FL:       60.8  mm     G. Age:  31w 4d         23  %    FL/AC:      21.1   %    20 - 24  Est. FW:    1954  gm      4 lb 5 oz     46  % ---------------------------------------------------------------------- OB History  Gravidity:    8         Term:   2        Prem:   0        SAB:   5  TOP:          0       Ectopic:  0        Living: 2 ---------------------------------------------------------------------- Gestational Age  LMP:           32w 1d        Date:  11/26/18                 EDD:   09/02/19  U/S Today:     32w 2d                                        EDD:   09/01/19  Best:          32w 1d     Det. By:  LMP  (11/26/18)          EDD:   09/02/19 ---------------------------------------------------------------------- Anatomy  Cranium:               Appears normal         LVOT:  Previously seen  Cavum:                 Previously seen        Aortic Arch:            Previously seen  Ventricles:            Appears normal         Ductal Arch:             Previously seen  Choroid Plexus:        Previously seen        Diaphragm:              Previously seen  Cerebellum:            Previously seen        Stomach:                Appears normal, left                                                                        sided  Posterior Fossa:       Previously seen        Abdomen:                Previously seen  Nuchal Fold:           Previously seen        Abdominal Wall:         Previously seen  Face:                  Orbits and profile     Cord Vessels:           Previously seen                         previously seen  Lips:                  Previously seen        Kidneys:                Appear normal  Palate:                Previously seen        Bladder:                Appears normal  Thoracic:              Previously seen        Spine:                  Previously seen  Heart:                 Appears normal         Upper Extremities:      Previously seen                         (4CH, axis, and                         situs)  RVOT:  Previously seen        Lower Extremities:      Previously seen  Other:  Technically difficult due to fetal position. ---------------------------------------------------------------------- Cervix Uterus Adnexa  Cervix  Not visualized (advanced GA >24wks)  Uterus  No abnormality visualized.  Left Ovary  No adnexal mass visualized.  Right Ovary  No adnexal mass visualized.  Cul De Sac  No free fluid seen.  Adnexa  No abnormality visualized. ---------------------------------------------------------------------- Comments  This patient was seen for a follow up growth scan due to a  history of chronic hypertension that is not currently treated  with any medications.  She denies any problems since her  last exam.  She was informed that the fetal growth and amniotic fluid  level appears appropriate for her gestational age.  A follow up exam was scheduled in 4 weeks.  ----------------------------------------------------------------------                   Ma RingsVictor Fang, MD Electronically Signed Final Report   07/09/2019 11:14 am ----------------------------------------------------------------------   Assessment and Plan:  Pregnancy: [id number] at 2682w0d 1. Supervision of other normal pregnancy, antepartum Cultures next visit  2. Chronic hypertension affecting pregnancy Normotensive without medication Continue ASA Normal growth on 2/16. Follow up growth scheduled on 3/16  3. Hyperemesis affecting pregnancy, antepartum Continue current regimen  4. Obesity affecting pregnancy, antepartum   Preterm labor symptoms and general obstetric precautions including but not limited to vaginal bleeding, contractions, leaking of fluid and fetal movement were reviewed in detail with the patient. I discussed the assessment and treatment plan with the patient. The patient was provided an opportunity to ask questions and all were answered. The patient agreed with the plan and demonstrated an understanding of the instructions. The patient was advised to call back or seek an in-person office evaluation/[facility] for any urgent or concerning symptoms. Please refer to After Visit Summary for other counseling recommendations.   I provided 11 minutes of face-to-face time during this encounter.  Return in about 1 week (around 08/05/2019) for ROB, in person, High risk, GBS.  Future Appointments  Date Time Provider Department Center  07/29/2019  9:00 AM Elesia Pemberton, Gigi GinPeggy, MD [facility] None  08/06/2019 10:45 AM [facility] NURSE [facility] MFC-US  08/06/2019 10:45 AM [facility] US 2 [facility]US MFC-US  08/13/2019 10:45 AM [facility] NURSE [facility] MFC-US  08/13/2019 10:45 AM [facility] US 2 [facility]-MFCUS MFC-US  08/20/2019 10:45 AM [facility] NURSE [facility] MFC-US  08/20/2019 10:45 AM [facility] US 2 [facility] MFC-US    Catalina AntiguaPeggy Amika Tassin, MD [facility]

## 2019-07-29 NOTE — Progress Notes (Signed)
Virtual ROB  CC: Nausea and vomiting  contractions 15 mins apart lasting for 1 hr per pt   Pt able to check   B/P: 115/75  P: 91  Wt: 207.2 lbs

## 2019-08-05 ENCOUNTER — Encounter: Payer: Self-pay | Admitting: Obstetrics and Gynecology

## 2019-08-05 ENCOUNTER — Ambulatory Visit (INDEPENDENT_AMBULATORY_CARE_PROVIDER_SITE_OTHER): Payer: 59 | Admitting: Obstetrics and Gynecology

## 2019-08-05 ENCOUNTER — Other Ambulatory Visit: Payer: Self-pay

## 2019-08-05 VITALS — BP 124/85 | HR 93 | Wt 208.0 lb

## 2019-08-05 DIAGNOSIS — Z113 Encounter for screening for infections with a predominantly sexual mode of transmission: Secondary | ICD-10-CM

## 2019-08-05 DIAGNOSIS — Z3A36 36 weeks gestation of pregnancy: Secondary | ICD-10-CM

## 2019-08-05 DIAGNOSIS — Z3480 Encounter for supervision of other normal pregnancy, unspecified trimester: Secondary | ICD-10-CM | POA: Diagnosis not present

## 2019-08-05 DIAGNOSIS — O10919 Unspecified pre-existing hypertension complicating pregnancy, unspecified trimester: Secondary | ICD-10-CM

## 2019-08-05 DIAGNOSIS — O10913 Unspecified pre-existing hypertension complicating pregnancy, third trimester: Secondary | ICD-10-CM

## 2019-08-05 DIAGNOSIS — O99213 Obesity complicating pregnancy, third trimester: Secondary | ICD-10-CM

## 2019-08-05 DIAGNOSIS — B373 Candidiasis of vulva and vagina: Secondary | ICD-10-CM | POA: Diagnosis not present

## 2019-08-05 DIAGNOSIS — N898 Other specified noninflammatory disorders of vagina: Secondary | ICD-10-CM

## 2019-08-05 DIAGNOSIS — O99210 Obesity complicating pregnancy, unspecified trimester: Secondary | ICD-10-CM

## 2019-08-05 NOTE — Progress Notes (Signed)
   PRENATAL VISIT NOTE  Subjective:  Carla [name] is a 29 y.o. [phone] at [redacted]w[redacted]d being seen today for ongoing prenatal care.  She is currently monitored for the following issues for this high-risk pregnancy and has Obesity affecting pregnancy, antepartum; Hyperemesis affecting pregnancy, antepartum; Supervision of other normal pregnancy, antepartum; and Chronic hypertension affecting pregnancy on their problem list.  Patient reports persistent nausea, unchanged from previous weeks.  Contractions: Irregular.  .  Movement: Present. Denies leaking of fluid.   The following portions of the patient's history were reviewed and updated as appropriate: allergies, current medications, past family history, past medical history, past social history, past surgical history and problem list.   Objective:   Vitals:   08/05/19 1021  BP: 124/85  Pulse: 93  Weight: 208 lb (94.3 kg)    Fetal Status: Fetal Heart Rate (bpm): 145 Fundal Height: 34 cm Movement: Present     General:  Alert, oriented and cooperative. Patient is in no acute distress.  Skin: Skin is warm and dry. No rash noted.   Cardiovascular: Normal heart rate noted  Respiratory: Normal respiratory effort, no problems with respiration noted  Abdomen: Soft, gravid, appropriate for gestational age.  Pain/Pressure: Present     Pelvic: Cervical exam performed Dilation: 1.5 Effacement (%): 50 Station: -3  Extremities: Normal range of motion.  Edema: None  Mental Status: Normal mood and affect. Normal behavior. Normal judgment and thought content.   Assessment and Plan:  Pregnancy: [id number] at [redacted]w[redacted]d 1. Supervision of other normal pregnancy, antepartum Patient is doing well Cultures today  2. Chronic hypertension affecting pregnancy Normotensive without medication Continue ASA Growth scan tomorrow  3. Obesity affecting pregnancy, antepartum   Preterm labor symptoms and general obstetric precautions including but not limited to vaginal  bleeding, contractions, leaking of fluid and fetal movement were reviewed in detail with the patient. Please refer to After Visit Summary for other counseling recommendations.   Return in about 1 week (around 08/12/2019) for Virtual, ROB, High risk.  Future Appointments  Date Time Provider Department Center  08/06/2019 10:45 AM [facility] NURSE [facility] MFC-US  08/06/2019 10:45 AM [facility] [place] 2 [facility] MFC-US  08/13/2019 10:45 AM [facility] NURSE [facility] MFC-US  08/13/2019 10:45 AM [facility] [place] 2 [facility]-MFCUS MFC-US  08/20/2019 10:45 AM [facility] NURSE [facility] MFC-US  08/20/2019 10:45 AM [facility] [place] 2 [facility] MFC-US    Catalina Antigua, MD

## 2019-08-06 ENCOUNTER — Encounter (HOSPITAL_COMMUNITY): Payer: Self-pay

## 2019-08-06 ENCOUNTER — Ambulatory Visit (HOSPITAL_COMMUNITY)
Admission: RE | Admit: 2019-08-06 | Discharge: 2019-08-06 | Disposition: A | Discharge status: Home / Self Care | Payer: 59 | Source: Ambulatory Visit | Attending: Obstetrics and Gynecology | Admitting: Obstetrics and Gynecology

## 2019-08-06 ENCOUNTER — Ambulatory Visit (HOSPITAL_COMMUNITY): Payer: 59 | Admitting: *Deleted

## 2019-08-06 VITALS — BP 121/74 | HR 92 | Temp 97.6°F

## 2019-08-06 DIAGNOSIS — O99213 Obesity complicating pregnancy, third trimester: Secondary | ICD-10-CM | POA: Diagnosis not present

## 2019-08-06 DIAGNOSIS — O10919 Unspecified pre-existing hypertension complicating pregnancy, unspecified trimester: Secondary | ICD-10-CM

## 2019-08-06 DIAGNOSIS — Z3480 Encounter for supervision of other normal pregnancy, unspecified trimester: Secondary | ICD-10-CM | POA: Insufficient documentation

## 2019-08-06 DIAGNOSIS — Z3A36 36 weeks gestation of pregnancy: Secondary | ICD-10-CM | POA: Diagnosis not present

## 2019-08-06 DIAGNOSIS — Z362 Encounter for other antenatal screening follow-up: Secondary | ICD-10-CM

## 2019-08-06 DIAGNOSIS — E669 Obesity, unspecified: Secondary | ICD-10-CM

## 2019-08-06 DIAGNOSIS — O10913 Unspecified pre-existing hypertension complicating pregnancy, third trimester: Secondary | ICD-10-CM

## 2019-08-06 DIAGNOSIS — O210 Mild hyperemesis gravidarum: Secondary | ICD-10-CM | POA: Diagnosis not present

## 2019-08-06 LAB — CERVICOVAGINAL ANCILLARY ONLY
Bacterial Vaginitis (gardnerella): NEGATIVE
Candida Glabrata: NEGATIVE
Candida Vaginitis: POSITIVE — AB
Chlamydia: NEGATIVE
Comment: NEGATIVE
Comment: NEGATIVE
Comment: NEGATIVE
Comment: NEGATIVE
Comment: NEGATIVE
Comment: NORMAL
Neisseria Gonorrhea: NEGATIVE
Trichomonas: NEGATIVE

## 2019-08-07 MED ORDER — TERCONAZOLE 0.8 % VA CREA: 1.0000 | TOPICAL_CREAM | Freq: Every day | VAGINAL | 0 refills | Status: DC

## 2019-08-07 NOTE — Addendum Note (Signed)
Addended by: Catalina Antigua on: 08/07/2019 11:35 AM   Modules accepted: Orders

## 2019-08-09 LAB — CULTURE, BETA STREP (GROUP B ONLY): Strep Gp B Culture: NEGATIVE

## 2019-08-12 ENCOUNTER — Telehealth (INDEPENDENT_AMBULATORY_CARE_PROVIDER_SITE_OTHER): Payer: 59 | Admitting: Obstetrics & Gynecology

## 2019-08-12 VITALS — BP 122/80 | HR 84 | Wt 205.8 lb

## 2019-08-12 DIAGNOSIS — Z3A37 37 weeks gestation of pregnancy: Secondary | ICD-10-CM

## 2019-08-12 DIAGNOSIS — O10919 Unspecified pre-existing hypertension complicating pregnancy, unspecified trimester: Secondary | ICD-10-CM

## 2019-08-12 DIAGNOSIS — O10913 Unspecified pre-existing hypertension complicating pregnancy, third trimester: Secondary | ICD-10-CM

## 2019-08-12 DIAGNOSIS — O210 Mild hyperemesis gravidarum: Secondary | ICD-10-CM

## 2019-08-12 DIAGNOSIS — O0993 Supervision of high risk pregnancy, unspecified, third trimester: Secondary | ICD-10-CM

## 2019-08-12 NOTE — Patient Instructions (Signed)
Return to office for any scheduled appointments. Call the office or go to the [facility] at [facility] [place] if:  You begin to have strong, frequent contractions  Your water breaks.  Sometimes it is a big gush of fluid, sometimes it is just a trickle that keeps getting your panties wet or running down your legs  You have vaginal bleeding.  It is normal to have a small amount of spotting if your cervix was checked.   You do not feel your baby moving like normal.  If you do not, get something to eat and drink and lay down and focus on feeling your baby move.   If your baby is still not moving like normal, you should call the office or go to MAU.  Any other obstetric concerns.   Labor Induction  Labor induction is when steps are taken to cause a pregnant woman to begin the labor process. Most women go into labor on their own between 37 weeks and 42 weeks of pregnancy. When this does not happen or when there is a medical need for labor to begin, steps may be taken to induce labor. Labor induction causes a pregnant woman's uterus to contract. It also causes the cervix to soften (ripen), open (dilate), and thin out (efface). Usually, labor is not induced before 39 weeks of pregnancy unless there is a medical reason to do so. Your health care provider will determine if labor induction is needed. Before inducing labor, your health care provider will consider a number of factors, including:  Your medical condition and your baby's.  How many weeks along you are in your pregnancy.  How mature your baby's lungs are.  The condition of your cervix.  The position of your baby.  The size of your birth canal. What are some reasons for labor induction? Labor may be induced if:  Your health or your baby's health is at risk.  Your pregnancy is overdue by 1 week or more.  Your water breaks but labor does not start on its own.  There is a low amount of amniotic fluid around your  baby. You may also choose (elect) to have labor induced at a certain time. Generally, elective labor induction is done no earlier than 39 weeks of pregnancy. What methods are used for labor induction? Methods used for labor induction include:  Prostaglandin medicine. This medicine starts contractions and causes the cervix to dilate and ripen. It can be taken by mouth (orally) or by being inserted into the vagina (suppository).  Inserting a small, thin tube (catheter) with a balloon into the vagina and then expanding the balloon with water to dilate the cervix.  Stripping the membranes. In this method, your health care provider gently separates amniotic sac tissue from the cervix. This causes the cervix to stretch, which in turn causes the release of a hormone called progesterone. The hormone causes the uterus to contract. This procedure is often done during an office visit, after which you will be sent home to wait for contractions to begin.  Breaking the water. In this method, your health care provider uses a small instrument to make a small hole in the amniotic sac. This eventually causes the amniotic sac to break. Contractions should begin after a few hours.  Medicine to trigger or strengthen contractions. This medicine is given through an IV that is inserted into a vein in your arm. Except for membrane stripping, which can be done in a clinic, labor   induction is done in the hospital so that you and your baby can be carefully monitored. How long does it take for labor to be induced? The length of time it takes to induce labor depends on how ready your body is for labor. Some inductions can take up to 2-3 days, while others may take less than a day. Induction may take longer if:  You are induced early in your pregnancy.  It is your first pregnancy.  Your cervix is not ready. What are some risks associated with labor induction? Some risks associated with labor induction include:  Changes  in fetal heart rate, such as being too high, too low, or irregular (erratic).  Failed induction.  Infection in the mother or the baby.  Increased risk of having a cesarean delivery.  Fetal death.  Breaking off (abruption) of the placenta from the uterus (rare).  Rupture of the uterus (very rare). When induction is needed for medical reasons, the benefits of induction generally outweigh the risks. What are some reasons for not inducing labor? Labor induction should not be done if:  Your baby does not tolerate contractions.  You have had previous surgeries on your uterus, such as a myomectomy, removal of fibroids, or a vertical scar from a previous cesarean delivery.  Your placenta lies very low in your uterus and blocks the opening of the cervix (placenta previa).  Your baby is not in a head-down position.  The umbilical cord drops down into the birth canal in front of the baby.  There are unusual circumstances, such as the baby being very early (premature).  You have had more than 2 previous cesarean deliveries. Summary  Labor induction is when steps are taken to cause a pregnant woman to begin the labor process.  Labor induction causes a pregnant woman's uterus to contract. It also causes the cervix to ripen, dilate, and efface.  Labor is not induced before 39 weeks of pregnancy unless there is a medical reason to do so.  When induction is needed for medical reasons, the benefits of induction generally outweigh the risks. This information is not intended to replace advice given to you by your health care provider. Make sure you discuss any questions you have with your health care provider. Document Revised: 05/12/2017 Document Reviewed: 06/22/2016 Elsevier Patient Education  2020 [employer]  

## 2019-08-12 NOTE — Progress Notes (Signed)
OBSTETRICS PRENATAL VIRTUAL VISIT ENCOUNTER NOTE  Provider location: [facility] [place]   I connected with Carla [name] on 08/12/19 at  3:30 PM EDT by MyChart Video Encounter at home and verified that I am speaking with the correct person using two identifiers.   I discussed the limitations, risks, security and privacy concerns of performing an evaluation and management service virtually and the availability of in person appointments. I also discussed with the patient that there may be a patient responsible charge related to this service. The patient expressed understanding and agreed to proceed. Subjective:  Carla [name] is a 29 y.o. [phone] at [redacted]w[redacted]d being seen today for ongoing prenatal care.  She is currently monitored for the following issues for this high-risk pregnancy and has Obesity affecting pregnancy, antepartum; Supervision of high risk pregnancy in third trimester; and Chronic hypertension affecting pregnancy on their problem list.  Patient reports nausea and vomiting that is still severe despite multiple antiemetics. Wants IOL as early as possible, she feels this can be done because of her CHTN also.  Contractions: Irregular. Vag. Bleeding: None.  Movement: Present. Denies any leaking of fluid.   The following portions of the patient's history were reviewed and updated as appropriate: allergies, current medications, past family history, past medical history, past social history, past surgical history and problem list.   Objective:   Vitals:   08/12/19 1541  BP: 122/80  Pulse: 84  Weight: 205 lb 12.8 oz (93.4 kg)    Fetal Status:     Movement: Present     General:  Alert, oriented and cooperative. Patient is in no acute distress.  Respiratory: Normal respiratory effort, no problems with respiration noted  Mental Status: Normal mood and affect. Normal behavior. Normal judgment and thought content.  Rest of physical exam deferred due to type of  encounter  Imaging: [place] MFM FETAL BPP WO NON STRESS  Result Date: 08/07/2019 ----------------------------------------------------------------------  OBSTETRICS REPORT                        (Signed Final 08/07/2019 01:32 am) ---------------------------------------------------------------------- Patient Info  ID #:       [id number]                          D.O.B.:  April 25, 1991 (29 yrs)  Name:       Carla [name]                 Visit Date: 08/06/2019 11:40 am ---------------------------------------------------------------------- Performed By  Performed By:     Emeline Darling BS,      Ref. Address:      [address]  [address]                                                              [place]                                                              [address]  Attending:        Lin Landsman      [facility]  Referred By:      [facility] ---------------------------------------------------------------------- Orders   #  Description                          Code         Ordered By   1  [place] MFM OB FOLLOW UP                  76816.01     YU FANG   2  [place] MFM FETAL BPP WO NON              76819.01     YU FANG      STRESS  ----------------------------------------------------------------------   #  Order #                    Accession #                 Episode #   1  [id number]                  [id number]                  [id number]   2  [id number]                  [id number]                  [id number]  ---------------------------------------------------------------------- Indications   [redacted] weeks gestation of pregnancy                Z3A.36   Hypertension - Chronic/Pre-existing (No        O10.019   Meds)   Encounter for other antenatal screening        Z36.2   follow-up    Hyperemesis gravidarum                         O21.0   Obesity complicating pregnancy, third          O99.213   trimester  ---------------------------------------------------------------------- Vital Signs  Weight (lb): 208  Height:        5'4"  BMI:         35.7 ---------------------------------------------------------------------- Fetal Evaluation  Num Of Fetuses:          1  Fetal Heart Rate(bpm):   137  Cardiac Activity:        Observed  Presentation:            Cephalic  Placenta:                Anterior  P. Cord Insertion:       Previously Visualized  Amniotic Fluid  AFI FV:      Within normal limits  AFI Sum(cm)     %Tile       Largest Pocket(cm)  19.44           73          5.59  RUQ(cm)       RLQ(cm)       LUQ(cm)        LLQ(cm)  4.19          4.37          5.59           5.29 ---------------------------------------------------------------------- Biophysical Evaluation  Amniotic F.V:   Pocket => 2 cm             F. Tone:         Observed  F. Movement:    Observed                   Score:           8/8  F. Breathing:   Observed ---------------------------------------------------------------------- Biometry  BPD:      84.6  mm     G. Age:  34w 1d          9  %    CI:        70.25   %    70 - 86                                                          FL/HC:       22.5  %    20.1 - 22.1  HC:      321.9  mm     G. Age:  36w 2d         25  %    HC/AC:       1.04       0.93 - 1.11  AC:      309.6  mm     G. Age:  34w 6d         25  %    FL/BPD:      85.6  %    71 - 87  FL:       72.4  mm     G. Age:  37w 0d         70  %    FL/AC:       23.4  %    20 - 24  Est. FW:    2708   gm          6 lb     35  % ---------------------------------------------------------------------- OB History  Gravidity:    8  Term:   2        Prem:   0        SAB:   5  TOP:          0       Ectopic:  0        Living: 2 ---------------------------------------------------------------------- Gestational Age   LMP:           36w 1d        Date:  11/26/18                 EDD:   09/02/19  U/S Today:     35w 4d                                        EDD:   09/06/19  Best:          36w 1d     Det. By:  LMP  (11/26/18)          EDD:   09/02/19 ---------------------------------------------------------------------- Anatomy  Cranium:               Appears normal         LVOT:                   Previously seen  Cavum:                 Previously seen        Aortic Arch:            Previously seen  Ventricles:            Previously seen        Ductal Arch:            Previously seen  Choroid Plexus:        Previously seen        Diaphragm:              Appears normal  Cerebellum:            Previously seen        Stomach:                Appears normal, left                                                                        sided  Posterior Fossa:       Previously seen        Abdomen:                Previously seen  Nuchal Fold:           Previously seen        Abdominal Wall:         Previously seen  Face:                  Orbits and profile     Cord Vessels:           Previously seen  previously seen  Lips:                  Previously seen        Kidneys:                Appear normal  Palate:                Previously seen        Bladder:                Appears normal  Thoracic:              Previously seen        Spine:                  Previously seen  Heart:                 Appears normal         Upper Extremities:      Previously seen                         (4CH, axis, and                         situs)  RVOT:                  Previously seen        Lower Extremities:      Previously seen  Other:  Technically difficult due to fetal position. ---------------------------------------------------------------------- Cervix Uterus Adnexa  Cervix  Not visualized (advanced GA >24wks) ---------------------------------------------------------------------- Impression  Normal interval growth  Good fetal  movement and amniotic fluid.  Known chronic hypertension no meds ---------------------------------------------------------------------- Recommendations  Continue weekly testing. ----------------------------------------------------------------------               Lin Landsman, MD Electronically Signed Final Report   08/07/2019 01:32 am ----------------------------------------------------------------------  [place] MFM OB FOLLOW UP  Result Date: 08/07/2019 ----------------------------------------------------------------------  OBSTETRICS REPORT                        (Signed Final 08/07/2019 01:32 am) ---------------------------------------------------------------------- Patient Info  ID #:       [id number]                          D.O.B.:  1990-12-29 (29 yrs)  Name:       Carla [name]                 Visit Date: 08/06/2019 11:40 am ---------------------------------------------------------------------- Performed By  Performed By:     Emeline Darling BS,      Ref. Address:      [address]  [address]                                                              [place]  Attending:        Sander Nephew      [facility]  Referred By:      [facility] ---------------------------------------------------------------------- Orders   #  Description                          Code         Ordered By   1  [place] MFM OB FOLLOW UP                  76816.01     YU FANG   2  [place] MFM FETAL BPP WO NON              76819.01     YU FANG      STRESS  ----------------------------------------------------------------------   #  Order #                    Accession #                 Episode #   1  [id number]                   [id number]                  [id number]   2  [id number]                  [id number]                  [id number]  ---------------------------------------------------------------------- Indications   [redacted] weeks gestation of pregnancy                Z3A.36   Hypertension - Chronic/Pre-existing (No        O10.019   Meds)   Encounter for other antenatal screening        Z36.2   follow-up   Hyperemesis gravidarum                         G64.4   Obesity complicating pregnancy, third          O99.213   trimester  ---------------------------------------------------------------------- Vital Signs  Weight (lb): 208  Height:        5'4"  BMI:         35.7 ---------------------------------------------------------------------- Fetal Evaluation  Num Of Fetuses:          1  Fetal Heart Rate(bpm):   137  Cardiac Activity:        Observed  Presentation:            Cephalic  Placenta:                Anterior  P. Cord Insertion:       Previously Visualized  Amniotic Fluid  AFI FV:      Within normal limits  AFI Sum(cm)     %Tile       Largest Pocket(cm)  19.44           73          5.59  RUQ(cm)       RLQ(cm)       LUQ(cm)        LLQ(cm)  4.19          4.37          5.59           5.29 ---------------------------------------------------------------------- Biophysical Evaluation  Amniotic F.V:   Pocket => 2 cm             F. Tone:         Observed  F. Movement:    Observed                   Score:           8/8  F. Breathing:   Observed ---------------------------------------------------------------------- Biometry  BPD:      84.6  mm     G. Age:  34w 1d          9  %    CI:        70.25   %    70 - 86                                                          FL/HC:       22.5  %    20.1 - 22.1  HC:      321.9  mm     G. Age:  36w 2d         25  %    HC/AC:       1.04       0.93 - 1.11  AC:      309.6  mm     G. Age:  34w 6d         25  %    FL/BPD:      85.6  %    71 - 87  FL:       72.4  mm     G. Age:  37w 0d          70  %    FL/AC:       23.4  %    20 - 24  Est. FW:    2708   gm          6 lb     35  % ---------------------------------------------------------------------- OB History  Gravidity:    8  Term:   2        Prem:   0        SAB:   5  TOP:          0       Ectopic:  0        Living: 2 ---------------------------------------------------------------------- Gestational Age  LMP:           36w 1d        Date:  11/26/18                 EDD:   09/02/19  U/S Today:     35w 4d                                        EDD:   09/06/19  Best:          36w 1d     Det. By:  LMP  (11/26/18)          EDD:   09/02/19 ---------------------------------------------------------------------- Anatomy  Cranium:               Appears normal         LVOT:                   Previously seen  Cavum:                 Previously seen        Aortic Arch:            Previously seen  Ventricles:            Previously seen        Ductal Arch:            Previously seen  Choroid Plexus:        Previously seen        Diaphragm:              Appears normal  Cerebellum:            Previously seen        Stomach:                Appears normal, left                                                                        sided  Posterior Fossa:       Previously seen        Abdomen:                Previously seen  Nuchal Fold:           Previously seen        Abdominal Wall:         Previously seen  Face:                  Orbits and profile     Cord Vessels:           Previously seen  previously seen  Lips:                  Previously seen        Kidneys:                Appear normal  Palate:                Previously seen        Bladder:                Appears normal  Thoracic:              Previously seen        Spine:                  Previously seen  Heart:                 Appears normal         Upper Extremities:      Previously seen                         (4CH, axis, and                         situs)  RVOT:                   Previously seen        Lower Extremities:      Previously seen  Other:  Technically difficult due to fetal position. ---------------------------------------------------------------------- Cervix Uterus Adnexa  Cervix  Not visualized (advanced GA >24wks) ---------------------------------------------------------------------- Impression  Normal interval growth  Good fetal movement and amniotic fluid.  Known chronic hypertension no meds ---------------------------------------------------------------------- Recommendations  Continue weekly testing. ----------------------------------------------------------------------               Lin Landsman, MD Electronically Signed Final Report   08/07/2019 01:32 am ----------------------------------------------------------------------   Assessment and Plan:  Pregnancy: [id number] at [redacted]w[redacted]d 1. Chronic hypertension affecting pregnancy BP stable without medications.  Patient desires IOL at 38 weeks, this will be scheduled. Orders placed.  GBS neg. BPP and scans as per MFM.  2. Hyperemesis affecting pregnancy, antepartum Losing weight rapidly, unable to tolerate much intake.  One of the reasons patient desires early IOL.   3. Supervision of high risk pregnancy in third trimester Will come in tomorrow to get Tdap. Term labor symptoms and general obstetric precautions including but not limited to vaginal bleeding, contractions, leaking of fluid and fetal movement were reviewed in detail with the patient. I discussed the assessment and treatment plan with the patient. The patient was provided an opportunity to ask questions and all were answered. The patient agreed with the plan and demonstrated an understanding of the instructions. The patient was advised to call back or seek an in-person office evaluation/go to [facility] at [facility] for any urgent or concerning symptoms. Please refer to After Visit Summary for other counseling recommendations.   I  provided 10 minutes of face-to-face time during this encounter.  Return in about 1 year (around 08/11/2020) for RN visit for Tdap vaccination.  Future Appointments  Date Time Provider Department Center  08/13/2019 10:45 AM WH-MFC NURSE WH-MFC MFC-US  08/13/2019 10:45 AM WH-MFC [place] 2 WH-MFCUS MFC-US  08/20/2019 10:45 AM [facility]-MFC NURSE WH-MFC MFC-US  08/20/2019 10:45 AM WH-MFC [place] 2 WH-MFCUS MFC-US    Jaynie Collins, MD [facility]

## 2019-08-13 ENCOUNTER — Ambulatory Visit (HOSPITAL_COMMUNITY)
Admission: RE | Admit: 2019-08-13 | Discharge: 2019-08-13 | Disposition: A | Discharge status: Home / Self Care | Payer: 59 | Source: Ambulatory Visit | Attending: Obstetrics and Gynecology | Admitting: Obstetrics and Gynecology

## 2019-08-13 ENCOUNTER — Ambulatory Visit (HOSPITAL_COMMUNITY): Payer: 59 | Admitting: *Deleted

## 2019-08-13 ENCOUNTER — Encounter (HOSPITAL_COMMUNITY): Payer: Self-pay

## 2019-08-13 ENCOUNTER — Ambulatory Visit (INDEPENDENT_AMBULATORY_CARE_PROVIDER_SITE_OTHER): Payer: 59 | Admitting: *Deleted

## 2019-08-13 ENCOUNTER — Other Ambulatory Visit: Payer: Self-pay

## 2019-08-13 DIAGNOSIS — D649 Anemia, unspecified: Secondary | ICD-10-CM | POA: Diagnosis not present

## 2019-08-13 DIAGNOSIS — Z23 Encounter for immunization: Secondary | ICD-10-CM | POA: Diagnosis not present

## 2019-08-13 DIAGNOSIS — O99013 Anemia complicating pregnancy, third trimester: Secondary | ICD-10-CM | POA: Diagnosis not present

## 2019-08-13 DIAGNOSIS — O10919 Unspecified pre-existing hypertension complicating pregnancy, unspecified trimester: Secondary | ICD-10-CM | POA: Insufficient documentation

## 2019-08-13 DIAGNOSIS — O0993 Supervision of high risk pregnancy, unspecified, third trimester: Secondary | ICD-10-CM | POA: Insufficient documentation

## 2019-08-13 DIAGNOSIS — Z3A37 37 weeks gestation of pregnancy: Secondary | ICD-10-CM | POA: Diagnosis not present

## 2019-08-13 DIAGNOSIS — E669 Obesity, unspecified: Secondary | ICD-10-CM

## 2019-08-13 DIAGNOSIS — O10013 Pre-existing essential hypertension complicating pregnancy, third trimester: Secondary | ICD-10-CM

## 2019-08-13 DIAGNOSIS — O210 Mild hyperemesis gravidarum: Secondary | ICD-10-CM | POA: Diagnosis not present

## 2019-08-13 DIAGNOSIS — O99213 Obesity complicating pregnancy, third trimester: Secondary | ICD-10-CM | POA: Diagnosis not present

## 2019-08-13 NOTE — Progress Notes (Signed)
Pt is in office for Tdap vaccine in pregnancy. Pt had routine OB visit yesterday via mychart and doing well. Pt states she is scheduled for induction this week.  Tdap given, pt tolerated well.  Pt has no other concerns today.

## 2019-08-13 NOTE — Progress Notes (Signed)
Agree with A & P. 

## 2019-08-14 ENCOUNTER — Telehealth (HOSPITAL_COMMUNITY): Payer: Self-pay | Admitting: *Deleted

## 2019-08-14 ENCOUNTER — Encounter (HOSPITAL_COMMUNITY): Payer: Self-pay | Admitting: *Deleted

## 2019-08-14 ENCOUNTER — Other Ambulatory Visit: Payer: Self-pay | Admitting: Advanced Practice Midwife

## 2019-08-14 NOTE — Telephone Encounter (Signed)
Preadmission screen  

## 2019-08-17 ENCOUNTER — Other Ambulatory Visit (HOSPITAL_COMMUNITY)
Admission: RE | Admit: 2019-08-17 | Discharge: 2019-08-17 | Disposition: A | Discharge status: Home / Self Care | Payer: 59 | Source: Ambulatory Visit | Attending: Family Medicine | Admitting: Family Medicine

## 2019-08-17 DIAGNOSIS — O99214 Obesity complicating childbirth: Secondary | ICD-10-CM | POA: Diagnosis not present

## 2019-08-17 DIAGNOSIS — E669 Obesity, unspecified: Secondary | ICD-10-CM | POA: Diagnosis not present

## 2019-08-17 DIAGNOSIS — O9902 Anemia complicating childbirth: Secondary | ICD-10-CM | POA: Diagnosis not present

## 2019-08-17 DIAGNOSIS — Z20822 Contact with and (suspected) exposure to covid-19: Secondary | ICD-10-CM | POA: Diagnosis not present

## 2019-08-17 DIAGNOSIS — O1002 Pre-existing essential hypertension complicating childbirth: Secondary | ICD-10-CM | POA: Diagnosis not present

## 2019-08-17 DIAGNOSIS — Z302 Encounter for sterilization: Secondary | ICD-10-CM | POA: Diagnosis not present

## 2019-08-17 LAB — SARS CORONAVIRUS 2 (TAT 6-24 HRS): SARS Coronavirus 2: NEGATIVE

## 2019-08-19 ENCOUNTER — Inpatient Hospital Stay (HOSPITAL_COMMUNITY): Payer: 59 | Admitting: Anesthesiology

## 2019-08-19 ENCOUNTER — Other Ambulatory Visit: Payer: Self-pay

## 2019-08-19 ENCOUNTER — Inpatient Hospital Stay (HOSPITAL_COMMUNITY)
Admission: AD | Admit: 2019-08-19 | Discharge: 2019-08-21 | DRG: 798 | Disposition: A | Discharge status: Home / Self Care | Payer: 59 | Attending: Obstetrics and Gynecology | Admitting: Obstetrics and Gynecology

## 2019-08-19 ENCOUNTER — Encounter (HOSPITAL_COMMUNITY): Payer: Self-pay | Admitting: Obstetrics & Gynecology

## 2019-08-19 ENCOUNTER — Inpatient Hospital Stay (HOSPITAL_COMMUNITY): Payer: 59

## 2019-08-19 DIAGNOSIS — O164 Unspecified maternal hypertension, complicating childbirth: Secondary | ICD-10-CM | POA: Diagnosis not present

## 2019-08-19 DIAGNOSIS — O9902 Anemia complicating childbirth: Secondary | ICD-10-CM | POA: Diagnosis present

## 2019-08-19 DIAGNOSIS — O1002 Pre-existing essential hypertension complicating childbirth: Principal | ICD-10-CM | POA: Diagnosis present

## 2019-08-19 DIAGNOSIS — Z302 Encounter for sterilization: Secondary | ICD-10-CM | POA: Diagnosis not present

## 2019-08-19 DIAGNOSIS — Z3A38 38 weeks gestation of pregnancy: Secondary | ICD-10-CM | POA: Diagnosis not present

## 2019-08-19 DIAGNOSIS — D649 Anemia, unspecified: Secondary | ICD-10-CM | POA: Diagnosis present

## 2019-08-19 DIAGNOSIS — E669 Obesity, unspecified: Secondary | ICD-10-CM | POA: Diagnosis present

## 2019-08-19 DIAGNOSIS — O99214 Obesity complicating childbirth: Secondary | ICD-10-CM | POA: Diagnosis present

## 2019-08-19 DIAGNOSIS — Z20822 Contact with and (suspected) exposure to covid-19: Secondary | ICD-10-CM | POA: Diagnosis present

## 2019-08-19 DIAGNOSIS — O10013 Pre-existing essential hypertension complicating pregnancy, third trimester: Secondary | ICD-10-CM | POA: Diagnosis not present

## 2019-08-19 DIAGNOSIS — Z3A36 36 weeks gestation of pregnancy: Secondary | ICD-10-CM | POA: Diagnosis not present

## 2019-08-19 DIAGNOSIS — O10919 Unspecified pre-existing hypertension complicating pregnancy, unspecified trimester: Secondary | ICD-10-CM | POA: Diagnosis present

## 2019-08-19 DIAGNOSIS — O0993 Supervision of high risk pregnancy, unspecified, third trimester: Secondary | ICD-10-CM

## 2019-08-19 DIAGNOSIS — O99210 Obesity complicating pregnancy, unspecified trimester: Secondary | ICD-10-CM | POA: Diagnosis present

## 2019-08-19 DIAGNOSIS — O09293 Supervision of pregnancy with other poor reproductive or obstetric history, third trimester: Secondary | ICD-10-CM | POA: Diagnosis not present

## 2019-08-19 DIAGNOSIS — O10913 Unspecified pre-existing hypertension complicating pregnancy, third trimester: Secondary | ICD-10-CM | POA: Diagnosis present

## 2019-08-19 LAB — CBC
HCT: 30.9 % — ABNORMAL LOW (ref 36.0–46.0)
Hemoglobin: 9.7 g/dL — ABNORMAL LOW (ref 12.0–15.0)
MCH: 26.6 pg (ref 26.0–34.0)
MCHC: 31.4 g/dL (ref 30.0–36.0)
MCV: 84.7 fL (ref 80.0–100.0)
Platelets: 293 10*3/uL (ref 150–400)
RBC: 3.65 MIL/uL — ABNORMAL LOW (ref 3.87–5.11)
RDW: 16 % — ABNORMAL HIGH (ref 11.5–15.5)
WBC: 9.9 10*3/uL (ref 4.0–10.5)
nRBC: 0.4 % — ABNORMAL HIGH (ref 0.0–0.2)

## 2019-08-19 LAB — COMPREHENSIVE METABOLIC PANEL
ALT: 32 U/L (ref 0–44)
AST: 37 U/L (ref 15–41)
Albumin: 2.6 g/dL — ABNORMAL LOW (ref 3.5–5.0)
Alkaline Phosphatase: 123 U/L (ref 38–126)
Anion gap: 12 (ref 5–15)
BUN: <5 mg/dL — ABNORMAL LOW (ref 6–20)
CO2: 19 mmol/L — ABNORMAL LOW (ref 22–32)
Calcium: 8.5 mg/dL — ABNORMAL LOW (ref 8.9–10.3)
Chloride: 105 mmol/L (ref 98–111)
Creatinine, Ser: 0.6 mg/dL (ref 0.44–1.00)
GFR calc Af Amer: >60 mL/min (ref >60)
GFR calc non Af Amer: >60 mL/min (ref >60)
Glucose, Bld: 92 mg/dL (ref 70–99)
Potassium: 2.8 mmol/L — ABNORMAL LOW (ref 3.5–5.1)
Sodium: 136 mmol/L (ref 135–145)
Total Bilirubin: 0.8 mg/dL (ref 0.3–1.2)
Total Protein: 6 g/dL — ABNORMAL LOW (ref 6.5–8.1)

## 2019-08-19 LAB — ABO/RH: ABO/RH(D): O POS

## 2019-08-19 LAB — TYPE AND SCREEN
ABO/RH(D): O POS
Antibody Screen: NEGATIVE

## 2019-08-19 LAB — RPR: RPR Ser Ql: NONREACTIVE

## 2019-08-19 MED ORDER — FENTANYL CITRATE (PF) 100 MCG/2ML IJ SOLN: 50.0000 ug | INTRAMUSCULAR | Status: DC | PRN

## 2019-08-19 MED ORDER — ONDANSETRON HCL 4 MG/2ML IJ SOLN: 4.0000 mg | INTRAMUSCULAR | Status: DC | PRN

## 2019-08-19 MED ORDER — OXYTOCIN 40 UNITS IN NORMAL SALINE INFUSION - SIMPLE MED
1.0000 m[IU]/min | INTRAVENOUS | Status: DC
  Administered 2019-08-19: 2 m[IU]/min via INTRAVENOUS
  Filled 2019-08-19: qty 1000

## 2019-08-19 MED ORDER — IBUPROFEN 600 MG PO TABS
600.0000 mg | ORAL_TABLET | Freq: Four times a day (QID) | ORAL | Status: DC
  Administered 2019-08-19 – 2019-08-21 (×7): 600 mg via ORAL
  Filled 2019-08-19 (×7): qty 1

## 2019-08-19 MED ORDER — TERBUTALINE SULFATE 1 MG/ML IJ SOLN: 0.2500 mg | Freq: Once | INTRAMUSCULAR | Status: DC | PRN

## 2019-08-19 MED ORDER — MISOPROSTOL 50MCG HALF TABLET
ORAL_TABLET | ORAL | Status: AC
  Filled 2019-08-19: qty 1

## 2019-08-19 MED ORDER — SIMETHICONE 80 MG PO CHEW: 80.0000 mg | CHEWABLE_TABLET | ORAL | Status: DC | PRN

## 2019-08-19 MED ORDER — LIDOCAINE HCL (PF) 1 % IJ SOLN
INTRAMUSCULAR | Status: DC | PRN
  Administered 2019-08-19 (×2): 4 mL via EPIDURAL

## 2019-08-19 MED ORDER — PHENYLEPHRINE 40 MCG/ML (10ML) SYRINGE FOR IV PUSH (FOR BLOOD PRESSURE SUPPORT): 80.0000 ug | PREFILLED_SYRINGE | INTRAVENOUS | Status: DC | PRN

## 2019-08-19 MED ORDER — FENTANYL-BUPIVACAINE-NACL 0.5-0.125-0.9 MG/250ML-% EP SOLN
12.0000 mL/h | EPIDURAL | Status: DC | PRN
  Filled 2019-08-19: qty 250

## 2019-08-19 MED ORDER — LACTATED RINGERS IV SOLN: 500.0000 mL | Freq: Once | INTRAVENOUS | Status: DC

## 2019-08-19 MED ORDER — OXYCODONE-ACETAMINOPHEN 5-325 MG PO TABS: 1.0000 | ORAL_TABLET | ORAL | Status: DC | PRN

## 2019-08-19 MED ORDER — MISOPROSTOL 25 MCG QUARTER TABLET
25.0000 ug | ORAL_TABLET | ORAL | Status: DC | PRN
  Filled 2019-08-19: qty 1

## 2019-08-19 MED ORDER — TETANUS-DIPHTH-ACELL PERTUSSIS 5-2.5-18.5 LF-MCG/0.5 IM SUSP: 0.5000 mL | Freq: Once | INTRAMUSCULAR | Status: DC

## 2019-08-19 MED ORDER — OXYCODONE HCL 5 MG PO TABS
5.0000 mg | ORAL_TABLET | ORAL | Status: DC | PRN
  Administered 2019-08-20: 5 mg via ORAL
  Filled 2019-08-19: qty 1

## 2019-08-19 MED ORDER — PRENATAL MULTIVITAMIN CH: 1.0000 | ORAL_TABLET | Freq: Every day | ORAL | Status: DC

## 2019-08-19 MED ORDER — ACETAMINOPHEN 325 MG PO TABS: 650.0000 mg | ORAL_TABLET | ORAL | Status: DC | PRN

## 2019-08-19 MED ORDER — MEASLES, MUMPS & RUBELLA VAC IJ SOLR: 0.5000 mL | Freq: Once | INTRAMUSCULAR | Status: DC

## 2019-08-19 MED ORDER — MISOPROSTOL 50MCG HALF TABLET: 50.0000 ug | ORAL_TABLET | ORAL | Status: DC

## 2019-08-19 MED ORDER — HYDROXYZINE HCL 50 MG PO TABS: 50.0000 mg | ORAL_TABLET | Freq: Four times a day (QID) | ORAL | Status: DC | PRN

## 2019-08-19 MED ORDER — EPHEDRINE 5 MG/ML INJ: 10.0000 mg | INTRAVENOUS | Status: DC | PRN

## 2019-08-19 MED ORDER — ZOLPIDEM TARTRATE 5 MG PO TABS: 5.0000 mg | ORAL_TABLET | Freq: Every evening | ORAL | Status: DC | PRN

## 2019-08-19 MED ORDER — LACTATED RINGERS IV SOLN: INTRAVENOUS | Status: DC

## 2019-08-19 MED ORDER — OXYTOCIN BOLUS FROM INFUSION
500.0000 mL | Freq: Once | INTRAVENOUS | Status: AC
  Administered 2019-08-19: 500 mL via INTRAVENOUS

## 2019-08-19 MED ORDER — DIPHENHYDRAMINE HCL 25 MG PO CAPS: 25.0000 mg | ORAL_CAPSULE | Freq: Four times a day (QID) | ORAL | Status: DC | PRN

## 2019-08-19 MED ORDER — LACTATED RINGERS IV SOLN: 500.0000 mL | INTRAVENOUS | Status: DC | PRN

## 2019-08-19 MED ORDER — WITCH HAZEL-GLYCERIN EX PADS: 1.0000 "application " | MEDICATED_PAD | CUTANEOUS | Status: DC | PRN

## 2019-08-19 MED ORDER — OXYTOCIN 40 UNITS IN NORMAL SALINE INFUSION - SIMPLE MED: 2.5000 [IU]/h | INTRAVENOUS | Status: DC

## 2019-08-19 MED ORDER — ACETAMINOPHEN 325 MG PO TABS
650.0000 mg | ORAL_TABLET | ORAL | Status: DC | PRN
  Administered 2019-08-19 – 2019-08-21 (×6): 650 mg via ORAL
  Filled 2019-08-19 (×6): qty 2

## 2019-08-19 MED ORDER — FLEET ENEMA 7-19 GM/118ML RE ENEM: 1.0000 | ENEMA | Freq: Every day | RECTAL | Status: DC | PRN

## 2019-08-19 MED ORDER — LIDOCAINE HCL (PF) 1 % IJ SOLN: 30.0000 mL | INTRAMUSCULAR | Status: DC | PRN

## 2019-08-19 MED ORDER — ONDANSETRON HCL 4 MG/2ML IJ SOLN
4.0000 mg | Freq: Four times a day (QID) | INTRAMUSCULAR | Status: DC | PRN
  Administered 2019-08-19: 4 mg via INTRAVENOUS
  Filled 2019-08-19: qty 2

## 2019-08-19 MED ORDER — SENNOSIDES-DOCUSATE SODIUM 8.6-50 MG PO TABS
2.0000 | ORAL_TABLET | ORAL | Status: DC
  Administered 2019-08-19 – 2019-08-20 (×2): 2 via ORAL
  Filled 2019-08-19 (×2): qty 2

## 2019-08-19 MED ORDER — DIPHENHYDRAMINE HCL 50 MG/ML IJ SOLN: 12.5000 mg | INTRAMUSCULAR | Status: DC | PRN

## 2019-08-19 MED ORDER — BENZOCAINE-MENTHOL 20-0.5 % EX AERO
1.0000 "application " | INHALATION_SPRAY | CUTANEOUS | Status: DC | PRN
  Administered 2019-08-20: 1 via TOPICAL
  Filled 2019-08-19: qty 56

## 2019-08-19 MED ORDER — OXYCODONE-ACETAMINOPHEN 5-325 MG PO TABS: 2.0000 | ORAL_TABLET | ORAL | Status: DC | PRN

## 2019-08-19 MED ORDER — COCONUT OIL OIL
1.0000 "application " | TOPICAL_OIL | Status: DC | PRN
  Administered 2019-08-19: 1 via TOPICAL

## 2019-08-19 MED ORDER — SOD CITRATE-CITRIC ACID 500-334 MG/5ML PO SOLN: 30.0000 mL | ORAL | Status: DC | PRN

## 2019-08-19 MED ORDER — DIBUCAINE (PERIANAL) 1 % EX OINT: 1.0000 "application " | TOPICAL_OINTMENT | CUTANEOUS | Status: DC | PRN

## 2019-08-19 MED ORDER — SODIUM CHLORIDE (PF) 0.9 % IJ SOLN
INTRAMUSCULAR | Status: DC | PRN
  Administered 2019-08-19: 12 mL/h via EPIDURAL

## 2019-08-19 MED ORDER — ONDANSETRON HCL 4 MG PO TABS: 4.0000 mg | ORAL_TABLET | ORAL | Status: DC | PRN

## 2019-08-19 NOTE — Discharge Summary (Signed)
Postpartum Discharge Summary     Patient Name: Carla [name] DOB: Jun 07, 1990 MRN: [id number]  Date of admission: 08/19/2019 Delivering Provider: Julianne Handler   Date of discharge: 08/21/2019  Admitting diagnosis: Chronic hypertension in obstetric context, third trimester [O10.913] Intrauterine pregnancy: [redacted]w[redacted]d    Secondary diagnosis:  Active Problems:   Obesity affecting pregnancy, antepartum   Chronic hypertension affecting pregnancy   Chronic hypertension in obstetric context, third trimester   SVD (spontaneous vaginal delivery)  Additional problems: none     Discharge diagnosis: Term Pregnancy Delivered and CHTN                                                                                                Post partum procedures:postpartum tubal ligation  Augmentation: Pitocin  Complications: None  Hospital course:  Induction of Labor With Vaginal Delivery   29y.o. yo [id number] admitted to the hospital 08/19/2019 for induction of labor.  Indication for induction: CHTN.  Patient had an uncomplicated labor course as follows: Membrane Rupture Time/Date: 2:40 PM ,08/19/2019   Intrapartum Procedures: Episiotomy: None [1]                                         Lacerations:  Labial [10]  Patient had delivery of a Viable infant.  Information for the patient's newborn:  JoTalia, Hoheisel0[[id number]]Delivery Method: Vaginal, Spontaneous(Filed from Delivery Summary)    08/19/2019  Details of delivery can be found in separate delivery note.  Patient had a routine postpartum course. Patient is discharged home 08/21/19. Delivery time: 2:59 PM    Magnesium Sulfate received: No BMZ received: No Rhophylac:N/A MMR:N/A Transfusion:No  Physical exam  Vitals:   08/20/19 1815 08/20/19 2218 08/21/19 0217 08/21/19 0514  BP: 124/86 125/84 138/90 134/84  Pulse: 76 98 71 72  Resp: _0 Temp: 98.1 F (36.7 C) 98.2 F (36.8 C) 97.7 F (36.5 C) 98.3 F (36.8  C)  TempSrc: Oral Oral Oral Oral  SpO2:  100% 97% 97%  Weight:      Height:       General: alert, cooperative and no distress Lochia: appropriate Uterine Fundus: firm Incision: Healing well with no significant drainage DVT Evaluation: No evidence of DVT seen on physical exam. Labs: Lab Results  Component Value Date   WBC 9.9 08/19/2019   HGB 9.7 (L) 08/19/2019   HCT 30.9 (L) 08/19/2019   MCV 84.7 08/19/2019   PLT 293 08/19/2019   CMP Latest Ref Rng & Units 08/19/2019  Glucose 70 - 99 mg/dL 92  BUN 6 - 20 mg/dL <5(L)  Creatinine 0.44 - 1.00 mg/dL 0.60  Sodium 135 - 145 mmol/L 136  Potassium 3.5 - 5.1 mmol/L 2.8(L)  Chloride 98 - 111 mmol/L 105  CO2 22 - 32 mmol/L 19(L)  Calcium 8.9 - 10.3 mg/dL 8.5(L)  Total Protein 6.5 - 8.1 g/dL 6.0(L)  Total Bilirubin 0.3 - 1.2 mg/dL 0.8  Alkaline Phos 38 -  126 U/L 123  AST 15 - 41 U/L 37  ALT 0 - 44 U/L 32   Edinburgh Score: Edinburgh Postnatal Depression Scale Screening Tool 08/19/2019  I have been able to laugh and see the funny side of things. 0  I have looked forward with enjoyment to things. 0  I have blamed myself unnecessarily when things went wrong. 1  I have been anxious or worried for no good reason. 0  I have felt scared or panicky for no good reason. 0  Things have been getting on top of me. 1  I have been so unhappy that I have had difficulty sleeping. 1  I have felt sad or miserable. 3  I have been so unhappy that I have been crying. 0  The thought of harming myself has occurred to me. 0  Edinburgh Postnatal Depression Scale Total 6    Discharge instruction: per After Visit Summary and "Baby and Me Booklet".  After visit meds:  Allergies as of 08/21/2019      Reactions   Shellfish Allergy Anaphylaxis   Latex Itching      Medication List    STOP taking these medications   aspirin EC 81 MG tablet   cyclobenzaprine 5 MG tablet Commonly known as: FLEXERIL   metoCLOPramide 10 MG tablet Commonly known as:  REGLAN   terconazole 0.8 % vaginal cream Commonly known as: TERAZOL 3     TAKE these medications   acetaminophen 325 MG tablet Commonly known as: Tylenol Take 2 tablets (650 mg total) by mouth every 4 (four) hours as needed (for pain scale < 4). What changed:   medication strength  how much to take  when to take this  reasons to take this   Blood Pressure Kit Devi 1 kit by Does not apply route once a week. CHECK BP WEEKLY.  LARGE CUFF. DX O09.0   doxylamine (Sleep) 25 MG tablet Commonly known as: UNISOM Take 25 mg by mouth at bedtime.   famotidine 20 MG tablet Commonly known as: PEPCID Take 1 tablet (20 mg total) by mouth 2 (two) times daily.   ferrous sulfate 325 (65 FE) MG tablet Take 1 tablet (325 mg total) by mouth every other day.   ibuprofen 600 MG tablet Commonly known as: ADVIL Take 1 tablet (600 mg total) by mouth every 6 (six) hours.   ondansetron 4 MG disintegrating tablet Commonly known as: ZOFRAN-ODT Take 1 tablet (4 mg total) by mouth every 6 (six) hours as needed. for nausea   oxyCODONE 5 MG immediate release tablet Commonly known as: Oxy IR/ROXICODONE Take 1 tablet (5 mg total) by mouth every 4 (four) hours as needed for moderate pain or severe pain.   pantoprazole 40 MG tablet Commonly known as: Protonix Take 1 tablet (40 mg total) by mouth daily.   prenatal multivitamin Tabs tablet Take 1 tablet by mouth daily at 12 noon.   promethazine 25 MG tablet Commonly known as: PHENERGAN Take 1 tablet (25 mg total) by mouth every 6 (six) hours as needed for nausea or vomiting.   pyridOXINE 100 MG tablet Commonly known as: VITAMIN B-6 Take 200 mg by mouth at bedtime.       Diet: routine diet  Activity: Advance as tolerated. Pelvic rest for 6 weeks.   Outpatient follow up:6 weeks Follow up Appt: Future Appointments  Date Time Provider [place]  09/30/2019 10:00 AM Shelly Bombard, MD [place] None   Follow up Visit:  Please  schedule this  patient for Postpartum visit in: 6 weeks with the following provider: Any provider Virtual For C/S patients schedule nurse incision check in weeks 2 weeks: no Low risk pregnancy complicated by: none Delivery mode:  SVD Anticipated Birth Control:  BTL done PP PP Procedures needed: none  Schedule Integrated BH visit: no  Newborn Data: Live born female  Birth Weight: 3120 g  APGAR: 47, 9  Newborn Delivery   Birth date/time: 08/19/2019 14:59:00 Delivery type: Vaginal, Spontaneous      Baby Feeding: Breast Disposition:home with mother   08/21/2019 Merilyn Baba, DO

## 2019-08-19 NOTE — H&P (Addendum)
OBSTETRIC ADMISSION HISTORY AND PHYSICAL  Carla [name] is a 29 y.o. female [phone] with IUP at 60w0dby 6 week [place] presenting for IOL for [place] She reports +FMs, No LOF, no VB, no blurry vision, headaches or peripheral edema, and RUQ pain.  She plans on breast feeding. She request tubal for birth control. [facility]  Dating: By LMP --->  Estimated Date of Delivery: 09/02/19  Sono:    '@[redacted]w[redacted]d'$ , CWD, normal anatomy, cephalic presentation, 21517O 35%ile EFW 6'0   Prenatal History/Complications: Obesity Chronic HTN Hyperemesis  Past Medical History: Past Medical History:  Diagnosis Date  . Allergy   . Anemia   . Depression    postpartum  . Headache(784.0)   . Hyperemesis arising during pregnancy   . PONV (postoperative nausea and vomiting)   . Pregnancy induced hypertension   . Spontaneous abortion 12/03/2010    Past Surgical History: Past Surgical History:  Procedure Laterality Date  . CHOLECYSTECTOMY N/A 05/13/2015   Procedure: LAPAROSCOPIC CHOLECYSTECTOMY ;  Surgeon: MDonnie Mesa MD;  [facility]  Service: General;  Laterality: N/A;  . DILATION AND EVACUATION  12/03/2010   Procedure: DILATATION AND EVACUATION (D&E);  Surgeon: LAgnes Lawrence MD;  [facility];  Service: Gynecology;  Laterality: N/A;  . OVARIAN CYST REMOVAL    . TONSILLECTOMY      Obstetrical History: OB History    Gravida  8   Para  2   Term  2   Preterm      AB  5   Living  2     SAB  5   TAB      Ectopic      Multiple  0   Live Births  2        Obstetric Comments  Patient has had multiple miscarriages- she has had 2 recent SAB. The most recent is reported 1 1/2 months ago. Pt states she may have had up to 8 pregnancies, has had multiple SAB- lost count.        Social History Social History   Socioeconomic History  . Marital status: Married    Spouse name: Not on file  . Number of children: Not on file  . Years  of education: Not on file  . Highest education level: Not on file  Occupational History  . Not on file  Tobacco Use  . Smoking status: Never Smoker  . Smokeless tobacco: Never Used  Substance and Sexual Activity  . Alcohol use: No    Alcohol/week: 0.0 standard drinks    Comment: Ocassionally  . Drug use: No  . Sexual activity: Yes    Partners: Male    Birth control/protection: None  Other Topics Concern  . Not on file  Social History Narrative  . Not on file   Social Determinants of Health   Financial Resource Strain:   . Difficulty of Paying Living Expenses:   Food Insecurity:   . Worried About RCharity fundraiserin the Last Year:   . RArboriculturistin the Last Year:   Transportation Needs:   . LFilm/video editor(Medical):   .Marland KitchenLack of Transportation (Non-Medical):   Physical Activity:   . Days of Exercise per Week:   . Minutes of Exercise per Session:   Stress:   . Feeling of Stress :   Social Connections:   . Frequency of Communication with Friends and Family:   .  Frequency of Social Gatherings with Friends and Family:   . Attends Religious Services:   . Active Member of Clubs or Organizations:   . Attends Archivist Meetings:   Marland Kitchen Marital Status:     Family History: Family History  Problem Relation Age of Onset  . Arthritis Mother   . Cancer Mother   . Diabetes Mother   . Hearing loss Mother   . Hypertension Mother   . Obesity Mother   . Stroke Mother   . Hypertension Father   . Stroke Father   . Hypertension Other   . Diabetes Other   . Stroke Other   . Cancer Other   . Heart failure Other   . Seizures Other   . ADD / ADHD Brother   . Asthma Brother   . Learning disabilities Brother   . Alcohol abuse Paternal Aunt   . Alcohol abuse Paternal Uncle   . COPD Maternal Grandmother   . Cancer Maternal Grandfather     Allergies: Allergies  Allergen Reactions  . Shellfish Allergy Anaphylaxis  . Latex Itching    Medications  Prior to Admission  Medication Sig Dispense Refill Last Dose  . aspirin EC 81 MG tablet Take 1 tablet (81 mg total) by mouth daily. Take after 12 weeks for prevention of preeclampsia later in pregnancy 300 tablet 2   . Blood Pressure Monitoring (BLOOD PRESSURE KIT) DEVI 1 kit by Does not apply route once a week. CHECK BP WEEKLY.  LARGE CUFF. DX O09.0 1 kit 0   . cyclobenzaprine (FLEXERIL) 5 MG tablet TAKE 1 TABLET BY MOUTH THREE TIMES DAILY AS NEEDED FOR MUSCLE SPASMS (Patient not taking: No sig reported) 30 tablet 0   . doxylamine, Sleep, (UNISOM) 25 MG tablet Take 1 tablet (25 mg total) by mouth 2 (two) times daily at 8 am and 10 pm. 60 tablet 6   . famotidine (PEPCID) 20 MG tablet Take 1 tablet (20 mg total) by mouth 2 (two) times daily. (Patient not taking: Reported on 08/13/2019) 30 tablet 0   . metoCLOPramide (REGLAN) 10 MG tablet TAKE 1 TABLET BY MOUTH EVERY 8 HOURS AS NEEDED FOR NAUSEA AND VOMITING (Patient not taking: Reported on 07/09/2019) 30 tablet 5   . ondansetron (ZOFRAN-ODT) 4 MG disintegrating tablet Take 1 tablet (4 mg total) by mouth every 6 (six) hours as needed. for nausea 20 tablet 3   . pantoprazole (PROTONIX) 40 MG tablet Take 1 tablet (40 mg total) by mouth daily. 30 tablet 1   . Prenatal Vit-Fe Fumarate-FA (PRENATAL VITAMIN PO) Take by mouth.     . promethazine (PHENERGAN) 25 MG tablet Take 1 tablet (25 mg total) by mouth every 6 (six) hours as needed for nausea or vomiting. 30 tablet 2   . pyridOXINE (VITAMIN B-6) 100 MG tablet Take 200 mg by mouth at bedtime.     Marland Kitchen scopolamine (TRANSDERM-SCOP) 1 MG/3DAYS Place 1 patch (1.5 mg total) onto the skin every 3 (three) days. (Patient not taking: Reported on 07/09/2019) 10 patch 12   . terconazole (TERAZOL 3) 0.8 % vaginal cream Place 1 applicator vaginally at bedtime. Apply nightly for three nights. (Patient not taking: Reported on 08/12/2019) 20 g 0      Review of Systems   All systems reviewed and negative except as stated in  HPI  Last menstrual period 11/26/2018, currently breastfeeding. General appearance: alert, cooperative and no distress Lungs: clear to auscultation bilaterally Heart: regular rate and rhythm Abdomen: soft, non-tender  Pelvic: 4/70/-2 Extremities: no edema, no sign of DVT Presentation: cephalic Fetal monitoringBaseline: 135 bpm, Variability: Good {> 6 bpm), Accelerations: Reactive and Decelerations: Absent Uterine activity irregular    Prenatal labs: ABO, Rh: O/Positive/-- (09/18 0949) Antibody: Negative (09/18 0949) Rubella: 1.65 (09/18 0949) RPR: Non Reactive (01/11 1005)  HBsAg: Negative (09/18 0949)  HIV: Non Reactive (01/11 1005)  GBS: Negative/-- (03/15 1051)  2 hr Glucola normal Genetic screening  normal Anatomy US normal  Prenatal Transfer Tool  Maternal Diabetes: No Genetic Screening: Normal Maternal Ultrasounds/Referrals: Normal Fetal Ultrasounds or other Referrals:  Referred to Materal Fetal Medicine  Maternal Substance Abuse:  None reported Significant Maternal Medications:  Meds include: Protonix Significant Maternal Lab Results: Group B Strep negative  No results found for this or any previous visit (from the past 24 hour(s)).  Patient Active Problem List   Diagnosis Date Noted  . Chronic hypertension in obstetric context, third trimester 08/19/2019  . Chronic hypertension affecting pregnancy 05/02/2019  . Supervision of high risk pregnancy in third trimester 02/08/2019  . Hyperemesis affecting pregnancy, antepartum 02/16/2017  . Obesity affecting pregnancy, antepartum 01/24/2017    Assessment/Plan:  Carla [name] is a 29 y.o. [id number] at 28w0dhere for IOL for cHTN.  #Labor: latent #Pain: Planning epidural #FWB: Category 1 #ID:  GBS negative #MOF: Breast #MOC:Tubal #Circ:  Yes, in hospital  ABruna Potter Medical Student  08/19/2019, 7:31 AM  Midwife attestation: I have seen and examined this patient; I agree with above documentation in the  student's note.   PE: Gen: calm comfortable, NAD Resp: normal effort and rate Abd: gravid  ROS, labs, PMH reviewed  Assessment/Plan: Carla ALMARIOis a 29y.o. [id number] for IOL Admit to LD Labor: latent FWB: Cat I GBS neg Admit to LD Pitocin IOL Anticipate SVD  MJulianne Handler CNM  08/19/2019, 9:30 AM

## 2019-08-19 NOTE — Anesthesia Preprocedure Evaluation (Signed)
Anesthesia Evaluation  Patient identified by MRN, date of birth, ID band Patient awake    Reviewed: Allergy & Precautions, Patient's Chart, lab work & pertinent test results  History of Anesthesia Complications (+) PONV and history of anesthetic complications  Airway Mallampati: II  TM Distance: >3 FB Neck ROM: Full    Dental no notable dental hx.    Pulmonary neg pulmonary ROS,    Pulmonary exam normal        Cardiovascular hypertension, Normal cardiovascular exam     Neuro/Psych Depression negative neurological ROS     GI/Hepatic Neg liver ROS, GERD  Medicated,  Endo/Other  negative endocrine ROS  Renal/GU negative Renal ROS  negative genitourinary   Musculoskeletal negative musculoskeletal ROS (+)   Abdominal   Peds  Hematology  (+) anemia , Hgb 9.7   Anesthesia Other Findings Day of surgery medications reviewed with patient.  Reproductive/Obstetrics (+) Pregnancy                             Anesthesia Physical Anesthesia Plan  ASA: II  Anesthesia Plan: Epidural   Post-op Pain Management:    Induction:   PONV Risk Score and Plan: Treatment may vary due to age or medical condition  Airway Management Planned: Natural Airway  Additional Equipment:   Intra-op Plan:   Post-operative Plan:   Informed Consent: I have reviewed the patients History and Physical, chart, labs and discussed the procedure including the risks, benefits and alternatives for the proposed anesthesia with the patient or authorized representative who has indicated his/her understanding and acceptance.       Plan Discussed with:   Anesthesia Plan Comments:         Anesthesia Quick Evaluation

## 2019-08-19 NOTE — Plan of Care (Signed)
  Problem: Clinical Measurements: Goal: Ability to maintain clinical measurements within normal limits will improve Outcome: Completed/Met Goal: Will remain free from infection Outcome: Completed/Met Goal: Diagnostic test results will improve Outcome: Completed/Met Goal: Respiratory complications will improve Outcome: Completed/Met Goal: Cardiovascular complication will be avoided Outcome: Completed/Met   Problem: Activity: Goal: Risk for activity intolerance will decrease Outcome: Completed/Met   Problem: Nutrition: Goal: Adequate nutrition will be maintained Outcome: Completed/Met   Problem: Coping: Goal: Level of anxiety will decrease Outcome: Completed/Met   Problem: Elimination: Goal: Will not experience complications related to urinary retention Outcome: Completed/Met   Problem: Pain Managment: Goal: General experience of comfort will improve Outcome: Completed/Met   Problem: Safety: Goal: Ability to remain free from injury will improve Outcome: Completed/Met

## 2019-08-19 NOTE — Anesthesia Preprocedure Evaluation (Addendum)
Anesthesia Evaluation  Patient identified by MRN, date of birth, ID band Patient awake    Reviewed: Allergy & Precautions, NPO status , Patient's Chart, lab work & pertinent test results  History of Anesthesia Complications (+) PONV and history of anesthetic complications  Airway Mallampati: II  TM Distance: >3 FB Neck ROM: Full    Dental no notable dental hx.    Pulmonary neg pulmonary ROS,    Pulmonary exam normal        Cardiovascular hypertension, Normal cardiovascular exam     Neuro/Psych Depression negative neurological ROS     GI/Hepatic Neg liver ROS, GERD  Medicated and Controlled,  Endo/Other  negative endocrine ROS  Renal/GU negative Renal ROS  negative genitourinary   Musculoskeletal negative musculoskeletal ROS (+)   Abdominal   Peds  Hematology  (+) anemia , Hgb 9.7   Anesthesia Other Findings Day of surgery medications reviewed with patient.  Reproductive/Obstetrics PPD1, undesired fertility                            Anesthesia Physical Anesthesia Plan  ASA: II  Anesthesia Plan: Epidural   Post-op Pain Management:    Induction:   PONV Risk Score and Plan: 4 or greater and Treatment may vary due to age or medical condition and Ondansetron  Airway Management Planned: Natural Airway  Additional Equipment: None  Intra-op Plan:   Post-operative Plan:   Informed Consent: I have reviewed the patients History and Physical, chart, labs and discussed the procedure including the risks, benefits and alternatives for the proposed anesthesia with the patient or authorized representative who has indicated his/her understanding and acceptance.       Plan Discussed with: CRNA  Anesthesia Plan Comments:        Anesthesia Quick Evaluation

## 2019-08-19 NOTE — Plan of Care (Signed)
  Problem: Education: Goal: Knowledge of General Education information will improve Description: Including pain rating scale, medication(s)/side effects and non-pharmacologic comfort measures Outcome: Completed/Met   Problem: Clinical Measurements: Goal: Ability to maintain clinical measurements within normal limits will improve Outcome: Completed/Met Goal: Will remain free from infection Outcome: Completed/Met Goal: Diagnostic test results will improve Outcome: Completed/Met Goal: Respiratory complications will improve Outcome: Completed/Met Goal: Cardiovascular complication will be avoided Outcome: Completed/Met   Problem: Activity: Goal: Risk for activity intolerance will decrease Outcome: Completed/Met   Problem: Nutrition: Goal: Adequate nutrition will be maintained Outcome: Completed/Met   Problem: Coping: Goal: Level of anxiety will decrease Outcome: Completed/Met   Problem: Elimination: Goal: Will not experience complications related to bowel motility Outcome: Completed/Met Goal: Will not experience complications related to urinary retention Outcome: Completed/Met   Problem: Pain Managment: Goal: General experience of comfort will improve Outcome: Completed/Met   Problem: Safety: Goal: Ability to remain free from injury will improve Outcome: Completed/Met   Problem: Skin Integrity: Goal: Risk for impaired skin integrity will decrease Outcome: Completed/Met   Problem: Education: Goal: Knowledge of condition will improve Outcome: Completed/Met   Problem: Activity: Goal: Will verbalize the importance of balancing activity with adequate rest periods Outcome: Completed/Met Goal: Ability to tolerate increased activity will improve Outcome: Completed/Met   Problem: Life Cycle: Goal: Chance of risk for complications during the postpartum period will decrease Outcome: Completed/Met   Problem: Role Relationship: Goal: Ability to demonstrate positive  interaction with newborn will improve Outcome: Completed/Met   Problem: Skin Integrity: Goal: Demonstration of wound healing without infection will improve Outcome: Completed/Met

## 2019-08-19 NOTE — Lactation Note (Signed)
This note was copied from a baby's chart. Lactation Consultation Note  Patient Name: Carla [name] EHUDJ'S Date: 08/19/2019 Reason for consult: Initial assessment;Early term 37-38.6wks  P3 mother whose infant is now 2 hours old.  This is an ETI at 38+0 weeks.  Mother breast fed her first child for 3 months and she pumped and bottle fed her second child for 10 months.  Baby was on father's chest when I arrived.  Mother had no immediate questions/concerns related to breast feeding.  Encouraged to feed 8-12 times/24 hours or sooner if baby shows feeding cues.  Reviewed cues.  Colostrum container provided for hand expression.  Mother will feed back any EBM she obtains to baby.  Milk storage times reviewed and finger feeding demonstrated.  Encouraged mother to call her RN/LC for latch assistance as needed.  Father present.    Mom made aware of O/P services, breastfeeding support groups, community resources, and our phone # for post-discharge questions. Mother has a DEBP for home use.     Maternal Data Formula Feeding for Exclusion: No Has patient been taught Hand Expression?: Yes Does the patient have breastfeeding experience prior to this delivery?: Yes  Feeding Feeding Type: Breast Fed  LATCH Score Latch: Grasps breast easily, tongue down, lips flanged, rhythmical sucking.  Audible Swallowing: Spontaneous and intermittent  Type of Nipple: Everted at rest and after stimulation  Comfort (Breast/Nipple): Soft / non-tender  Hold (Positioning): Assistance needed to correctly position infant at breast and maintain latch.  LATCH Score: 9  Interventions    Lactation Tools Discussed/Used     Consult Status Consult Status: Follow-up Date: 08/20/19 Follow-up type: In-patient    Dora Sims 08/19/2019, 5:26 PM

## 2019-08-19 NOTE — Progress Notes (Addendum)
Labor Progress Note Carla [name] is a 29 y.o. [phone] at [redacted]w[redacted]d presented for IOL for cHTN  S: Patient is comfortable with epidural.   O:  BP 110/61   Pulse 75   Temp 97.9 F (36.6 C) (Oral)   Resp 18   Ht 5\' 4"  (1.626 m)   Wt 93 kg   LMP 11/26/2018   SpO2 99%   BMI 35.19 kg/m  EFM: 125 bpm/moderate variability/+ accelerations/ no decels  CVE: deferred  A&P: 28 y.o. 38 [redacted]w[redacted]d IOL for cHTN. #Labor: Progressing well. Continue pitocin. Anticipate SVD  #Pain: Epidural placed #FWB: Category 1 #GBS negative  [redacted]w[redacted]d, Medical Student 2:05 PM  Midwife attestation I agree with the documentation in the student's note.   Lilyan Gilford, CNM 2:17 PM

## 2019-08-19 NOTE — Anesthesia Procedure Notes (Signed)
Epidural Patient location during procedure: OB Start time: 08/19/2019 11:03 AM End time: 08/19/2019 11:06 AM  Staffing Anesthesiologist: Kaylyn Layer, MD Performed: anesthesiologist   Preanesthetic Checklist Completed: patient identified, IV checked, risks and benefits discussed, monitors and equipment checked, pre-op evaluation and timeout performed  Epidural Patient position: sitting Prep: DuraPrep and site prepped and draped Patient monitoring: continuous pulse ox, blood pressure and heart rate Approach: midline Location: L3-L4 Injection technique: LOR air  Needle:  Needle type: Tuohy  Needle gauge: 17 G Needle length: 9 cm Needle insertion depth: 6 cm Catheter type: closed end flexible Catheter size: 19 Gauge Catheter at skin depth: 11 cm Test dose: negative and Other (1% lidocaine)  Assessment Events: blood not aspirated, injection not painful, no injection resistance, no paresthesia and negative IV test  Additional Notes Patient identified. Risks, benefits, and alternatives discussed with patient including but not limited to bleeding, infection, nerve damage, paralysis, failed block, incomplete pain control, headache, blood pressure changes, nausea, vomiting, reactions to medication, itching, and postpartum back pain. Confirmed with bedside nurse the patient's most recent platelet count. Confirmed with patient that they are not currently taking any anticoagulation, have any bleeding history, or any family history of bleeding disorders. Patient expressed understanding and wished to proceed. All questions were answered. Sterile technique was used throughout the entire procedure. Please see nursing notes for vital signs.   Crisp LOR on first pass. Test dose was given through epidural catheter and negative prior to continuing to dose epidural or start infusion. Warning signs of high block given to the patient including shortness of breath, tingling/numbness in hands, complete  motor block, or any concerning symptoms with instructions to call for help. Patient was given instructions on fall risk and not to get out of bed. All questions and concerns addressed with instructions to call with any issues or inadequate analgesia.  Reason for block:procedure for pain

## 2019-08-20 ENCOUNTER — Encounter (HOSPITAL_COMMUNITY)
Admission: AD | Disposition: A | Discharge status: Home / Self Care | Payer: Self-pay | Source: Home / Self Care | Attending: Obstetrics and Gynecology

## 2019-08-20 ENCOUNTER — Inpatient Hospital Stay (HOSPITAL_COMMUNITY): Payer: 59 | Admitting: Anesthesiology

## 2019-08-20 ENCOUNTER — Ambulatory Visit (HOSPITAL_COMMUNITY): Payer: 59

## 2019-08-20 ENCOUNTER — Encounter (HOSPITAL_COMMUNITY): Payer: Self-pay | Admitting: Obstetrics & Gynecology

## 2019-08-20 DIAGNOSIS — Z302 Encounter for sterilization: Secondary | ICD-10-CM

## 2019-08-20 HISTORY — PX: TUBAL LIGATION: SHX77

## 2019-08-20 SURGERY — LIGATION, FALLOPIAN TUBE, POSTPARTUM
Anesthesia: Epidural | Site: Abdomen | Wound class: Clean Contaminated

## 2019-08-20 MED ORDER — BUPIVACAINE HCL (PF) 0.25 % IJ SOLN
INTRAMUSCULAR | Status: AC
  Filled 2019-08-20: qty 30

## 2019-08-20 MED ORDER — ONDANSETRON HCL 4 MG/2ML IJ SOLN
INTRAMUSCULAR | Status: DC | PRN
  Administered 2019-08-20: 4 mg via INTRAVENOUS

## 2019-08-20 MED ORDER — FENTANYL CITRATE (PF) 100 MCG/2ML IJ SOLN
INTRAMUSCULAR | Status: AC
  Filled 2019-08-20: qty 2

## 2019-08-20 MED ORDER — KETOROLAC TROMETHAMINE 30 MG/ML IJ SOLN
30.0000 mg | Freq: Once | INTRAMUSCULAR | Status: AC
  Administered 2019-08-20: 30 mg via INTRAVENOUS

## 2019-08-20 MED ORDER — PROMETHAZINE HCL 25 MG/ML IJ SOLN: 6.2500 mg | INTRAMUSCULAR | Status: DC | PRN

## 2019-08-20 MED ORDER — FERROUS SULFATE 325 (65 FE) MG PO TABS
325.0000 mg | ORAL_TABLET | Freq: Every day | ORAL | Status: DC
  Administered 2019-08-21: 325 mg via ORAL
  Filled 2019-08-20: qty 1

## 2019-08-20 MED ORDER — FENTANYL CITRATE (PF) 100 MCG/2ML IJ SOLN
INTRAMUSCULAR | Status: DC | PRN
  Administered 2019-08-20: 100 ug via EPIDURAL

## 2019-08-20 MED ORDER — SODIUM BICARBONATE 8.4 % IV SOLN: INTRAVENOUS | Status: DC | PRN

## 2019-08-20 MED ORDER — LIDOCAINE-EPINEPHRINE (PF) 2 %-1:200000 IJ SOLN
INTRAMUSCULAR | Status: AC
  Filled 2019-08-20: qty 10

## 2019-08-20 MED ORDER — ONDANSETRON HCL 4 MG/2ML IJ SOLN
INTRAMUSCULAR | Status: AC
  Filled 2019-08-20: qty 2

## 2019-08-20 MED ORDER — OXYCODONE-ACETAMINOPHEN 5-325 MG PO TABS: 1.0000 | ORAL_TABLET | ORAL | Status: DC | PRN

## 2019-08-20 MED ORDER — ACETAMINOPHEN 500 MG PO TABS: 1000.0000 mg | ORAL_TABLET | Freq: Once | ORAL | Status: DC

## 2019-08-20 MED ORDER — LIDOCAINE-EPINEPHRINE (PF) 2 %-1:200000 IJ SOLN: INTRAMUSCULAR | Status: DC | PRN

## 2019-08-20 MED ORDER — FENTANYL CITRATE (PF) 100 MCG/2ML IJ SOLN: 25.0000 ug | INTRAMUSCULAR | Status: DC | PRN

## 2019-08-20 MED ORDER — TRAMADOL HCL 50 MG PO TABS
50.0000 mg | ORAL_TABLET | Freq: Four times a day (QID) | ORAL | Status: DC | PRN
  Administered 2019-08-21 (×2): 50 mg via ORAL
  Filled 2019-08-20 (×2): qty 1

## 2019-08-20 MED ORDER — METOCLOPRAMIDE HCL 10 MG PO TABS
10.0000 mg | ORAL_TABLET | Freq: Once | ORAL | Status: AC
  Administered 2019-08-20: 10 mg via ORAL
  Filled 2019-08-20: qty 1

## 2019-08-20 MED ORDER — LIDOCAINE-EPINEPHRINE (PF) 2 %-1:200000 IJ SOLN
INTRAMUSCULAR | Status: DC | PRN
  Administered 2019-08-20 (×2): 5 mL via EPIDURAL
  Administered 2019-08-20: 3 mL via EPIDURAL
  Administered 2019-08-20: 1 mL via EPIDURAL
  Administered 2019-08-20: 5 mL via EPIDURAL
  Administered 2019-08-20: 2 mL via EPIDURAL

## 2019-08-20 MED ORDER — FAMOTIDINE 20 MG PO TABS
40.0000 mg | ORAL_TABLET | Freq: Once | ORAL | Status: AC
  Administered 2019-08-20: 40 mg via ORAL
  Filled 2019-08-20: qty 2

## 2019-08-20 MED ORDER — LACTATED RINGERS IV SOLN
INTRAVENOUS | Status: DC
  Administered 2019-08-20: 10 mL via INTRAVENOUS

## 2019-08-20 MED ORDER — MIDAZOLAM HCL 2 MG/2ML IJ SOLN
INTRAMUSCULAR | Status: AC
  Filled 2019-08-20: qty 2

## 2019-08-20 MED ORDER — KETOROLAC TROMETHAMINE 30 MG/ML IJ SOLN
INTRAMUSCULAR | Status: AC
  Filled 2019-08-20: qty 1

## 2019-08-20 MED ORDER — ACETAMINOPHEN 160 MG/5ML PO SOLN: 1000.0000 mg | Freq: Once | ORAL | Status: DC

## 2019-08-20 MED ORDER — BUPIVACAINE HCL (PF) 0.25 % IJ SOLN
INTRAMUSCULAR | Status: DC | PRN
  Administered 2019-08-20: 30 mL

## 2019-08-20 SURGICAL SUPPLY — 20 items
BLADE SURG 11 STRL SS (BLADE) ×3 IMPLANT
DRSG OPSITE POSTOP 3X4 (GAUZE/BANDAGES/DRESSINGS) ×3 IMPLANT
DURAPREP 26ML APPLICATOR (WOUND CARE) ×3 IMPLANT
GLOVE BIOGEL PI IND STRL 7.0 (GLOVE) ×1 IMPLANT
GLOVE BIOGEL PI IND STRL 7.5 (GLOVE) ×1 IMPLANT
GLOVE BIOGEL PI INDICATOR 7.0 (GLOVE) ×2
GLOVE BIOGEL PI INDICATOR 7.5 (GLOVE) ×2
GLOVE ECLIPSE 7.5 STRL STRAW (GLOVE) ×3 IMPLANT
GOWN STRL REUS W/TWL LRG LVL3 (GOWN DISPOSABLE) ×6 IMPLANT
HIBICLENS CHG 4% 4OZ BTL (MISCELLANEOUS) ×3 IMPLANT
NEEDLE HYPO 22GX1.5 SAFETY (NEEDLE) ×3 IMPLANT
NS IRRIG 1000ML POUR BTL (IV SOLUTION) ×3 IMPLANT
PACK ABDOMINAL MINOR (CUSTOM PROCEDURE TRAY) ×3 IMPLANT
PROTECTOR NERVE ULNAR (MISCELLANEOUS) ×3 IMPLANT
SPONGE LAP 4X18 RFD (DISPOSABLE) IMPLANT
SUT VICRYL 0 UR6 27IN ABS (SUTURE) ×3 IMPLANT
SUT VICRYL 4-0 PS2 18IN ABS (SUTURE) ×3 IMPLANT
SYR CONTROL 10ML LL (SYRINGE) ×3 IMPLANT
TOWEL OR 17X24 6PK STRL BLUE (TOWEL DISPOSABLE) ×6 IMPLANT
TRAY FOLEY W/BAG SLVR 14FR (SET/KITS/TRAYS/PACK) ×3 IMPLANT

## 2019-08-20 NOTE — Anesthesia Postprocedure Evaluation (Signed)
Anesthesia Post Note  Patient: Carla [name]  Procedure(s) Performed: AN AD HOC LABOR EPIDURAL     Patient location during evaluation: Mother Baby Anesthesia Type: Epidural Level of consciousness: awake Pain management: satisfactory to patient Vital Signs Assessment: post-procedure vital signs reviewed and stable Respiratory status: spontaneous breathing Cardiovascular status: stable Anesthetic complications: no    Last Vitals:  Vitals:   08/20/19 0010 08/20/19 0614  BP: 128/81 122/85  Pulse: 81 82  Resp: 18 18  Temp: (!) 36.4 C 36.5 C  SpO2:      Last Pain:  Vitals:   08/20/19 0614  TempSrc: Oral  PainSc:    Pain Goal:                   Cephus Shelling

## 2019-08-20 NOTE — Progress Notes (Signed)
Post Partum Day 1 Subjective: Patient reports feeling well. She is tolerating PO. Ambulating and urinating without difficulty. Lochia minimal.  Objective: Blood pressure 128/81, pulse 81, temperature (!) 97.5 F (36.4 C), temperature source Oral, resp. rate 18, height 5\' 4"  (1.626 m), weight 93 kg, last menstrual period 11/26/2018, SpO2 99 %, unknown if currently breastfeeding.  Physical Exam:  General: alert, cooperative and appears stated age Lochia: appropriate Uterine Fundus: firm Incision: NA DVT Evaluation: No evidence of DVT seen on physical exam.  Recent Labs    08/19/19 0759  HGB 9.7*  HCT 30.9*    Assessment/Plan: Plan for discharge tomorrow. Possible discharge today if baby can go and patient desires to discharge later today after BTL. PO iron for baseline anemia. BTL later today Vitals stable; patient with cHTN, will cont to trend at this time. No need for anti-hypertensive at this time.  Breastfeeding    LOS: 1 day   08/21/19 08/20/2019, 4:18 AM

## 2019-08-20 NOTE — Anesthesia Postprocedure Evaluation (Signed)
Anesthesia Post Note  Patient: EDLA PARA  Procedure(s) Performed: POST PARTUM TUBAL LIGATION (N/A Abdomen)     Patient location during evaluation: PACU Anesthesia Type: Epidural Level of consciousness: awake and alert and oriented Pain management: pain level controlled Vital Signs Assessment: post-procedure vital signs reviewed and stable Respiratory status: spontaneous breathing, nonlabored ventilation and respiratory function stable Cardiovascular status: blood pressure returned to baseline Postop Assessment: epidural receding, no apparent nausea or vomiting, no headache and no backache Anesthetic complications: no    Last Vitals:  Vitals:   08/20/19 1142 08/20/19 1152  BP: 112/72 109/74  Pulse: 67   Resp: 20 18  Temp: 36.5 C 36.4 C  SpO2: 99% 100%    Last Pain:  Vitals:   08/20/19 1152  TempSrc: Oral  PainSc:    Pain Goal:    LLE Motor Response: Purposeful movement (08/20/19 1142) LLE Sensation: Tingling (08/20/19 1142) RLE Motor Response: Purposeful movement (08/20/19 1142) RLE Sensation: Tingling (08/20/19 1142)     Epidural/Spinal Function Cutaneous sensation: Tingles (08/20/19 1142), Patient able to flex knees: Yes (08/20/19 1142), Patient able to lift hips off bed: Yes (08/20/19 1142), Back pain beyond tenderness at insertion site: No (08/20/19 1142), Progressively worsening motor and/or sensory loss: No (08/20/19 1142), Bowel and/or bladder incontinence post epidural: No (08/20/19 1142)  Kaylyn Layer

## 2019-08-20 NOTE — Progress Notes (Signed)
CSW received consult for history of PPD. CSW met with MOB to offer support and complete assessment.    MOB resting in bed with FOB present at bedside, when CSW entered the room. Infant not present as he was receiving his circumcision. CSW introduced self and received verbal permission from MOB to complete assessment with FOB present. CSW explained reason for consult to which MOB expressed understanding. MOB and FOB both very pleasant and engaged throughout assessment. CSW inquired about MOB's mental health history to which MOB acknowledged history of PPD in 2019 after her last baby was born. MOB described symptoms of crying a lot and overall sadness. Per MOB and FOB, they had lost both FOB's grandmother and uncle within days after last baby was born and this contributed a lot to symptoms. Per MOB, symptoms lasted for about a year before going away on their own. MOB stated she was told much of it was due to hormonal imbalances. MOB reported she currently feels "fine" and denied any current medications or counseling. MOB stated she previously participated in counseling through [facility] and knows how to reach out if she feels she needs it. CSW provided education regarding the baby blues period vs. perinatal mood disorders. CSW recommended self-evaluation during the postpartum time period using the New Mom Checklist from Postpartum Progress and encouraged MOB to contact a medical professional if symptoms are noted at any time. MOB did not appear to be displaying any acute mental health symptoms and denied any current SI or HI. MOB reported having good support from FOB and her mother.   MOB confirmed having all essential items for infant once discharged and stated infant would be sleeping in a bassinet once home. CSW provided review of Sudden Infant Death Syndrome (SIDS) precautions and safe sleeping habits.    CSW identifies no further need for intervention and no barriers to discharge at this  time.  Elijio Miles, LCSW [facility] [employer] [facility] [phone]

## 2019-08-20 NOTE — Transfer of Care (Signed)
Immediate Anesthesia Transfer of Care Note  Patient: Carla [name]  Procedure(s) Performed: POST PARTUM TUBAL LIGATION (N/A Abdomen)  Patient Location: PACU  Anesthesia Type:Epidural  Level of Consciousness: awake  Airway & Oxygen Therapy: Patient Spontanous Breathing  Post-op Assessment: Report given to RN  Post vital signs: Reviewed and stable  Last Vitals:  Vitals Value Taken Time  BP 93/73 08/20/19 1041  Temp    Pulse 73 08/20/19 1043  Resp 18 08/20/19 1043  SpO2 100 % 08/20/19 1043  Vitals shown include unvalidated device data.  Last Pain:  Vitals:   08/20/19 0837  TempSrc: Oral  PainSc:          Complications: No apparent anesthesia complications

## 2019-08-20 NOTE — Progress Notes (Signed)
Patient ID: Carla [name], female   DOB: 1991/05/17, 29 y.o.   MRN: [id number]  Risks of procedure discussed with patient including but not limited to: risk of regret, permanence of method, bleeding, infection, injury to surrounding organs and need for additional procedures.  Failure risk of 1% with increased risk of ectopic gestation if pregnancy occurs was also discussed with patient.    [name], DO 08/20/2019 9:43 AM

## 2019-08-20 NOTE — Op Note (Signed)
Carla [name] 08/20/2019  PREOPERATIVE DIAGNOSIS:  Undesired fertility  POSTOPERATIVE DIAGNOSIS:  Undesired fertility  PROCEDURE:  Postpartum Bilateral Tubal Sterilization using Filshie Clips   SURGEON:  Dr Candelaria Celeste  ANESTHESIA:  Epidural  COMPLICATIONS:  None immediate.  ESTIMATED BLOOD LOSS:  Less than 20cc.  FLUIDS: 800 mL LR.  URINE OUTPUT:  50 mL of clear urine.  INDICATIONS: 29 y.o. yo A1P3790  with undesired fertility,status post vaginal delivery, desires permanent sterilization. Risks and benefits of procedure discussed with patient including permanence of method, bleeding, infection, injury to surrounding organs and need for additional procedures. Risk failure of 0.5-1% with increased risk of ectopic gestation if pregnancy occurs was also discussed with patient.   FINDINGS:  Normal uterus, tubes, and ovaries.  TECHNIQUE:  The patient was taken to the operating room where her epidural anesthesia was dosed up to surgical level and found to be adequate.  She was then placed in the dorsal supine position and prepped and draped in sterile fashion.  After an adequate timeout was performed, attention was turned to the patient's abdomen where a small transverse skin incision was made under the umbilical fold. The incision was taken down to the layer of fascia using the scalpel, and fascia was incised, and extended bilaterally using Mayo scissors. The peritoneum was entered in a sharp fashion.   The patient's left fallopian tube was then identified, and the Babcock clamp was then used to grasp the tube approximately 3 cm from the cornual region. A 3 cm segment of the tube was then doubly ligated with free tie of plain gut suture, transected and excised. Good hemostasis was noted. The right fallopian tube was then identified, doubly ligated, and a 3 cm segment excised in a similar fashion allowing for bilateral tubal sterilization. Excellent hemostasis was noted.  Good hemostasis  was noted overall.  The instruments were then removed from the patient's abdomen and the fascial incision was repaired with 0 Vicryl, and the skin was closed with a 3-0 Monocryl subcuticular stitch. The patient tolerated the procedure well.  Sponge, lap, and needle counts were correct times two.  The patient was then taken to the recovery room awake, extubated and in stable condition.   [name], DO 08/20/2019 10:40 AM

## 2019-08-21 LAB — SURGICAL PATHOLOGY

## 2019-08-21 LAB — BIRTH TISSUE RECOVERY COLLECTION (PLACENTA DONATION)

## 2019-08-21 MED ORDER — IBUPROFEN 600 MG PO TABS: 600.0000 mg | ORAL_TABLET | Freq: Four times a day (QID) | ORAL | 0 refills | Status: DC

## 2019-08-21 MED ORDER — FERROUS SULFATE 325 (65 FE) MG PO TABS: 325.0000 mg | ORAL_TABLET | ORAL | 3 refills | Status: DC

## 2019-08-21 MED ORDER — OXYCODONE HCL 5 MG PO TABS: 5.0000 mg | ORAL_TABLET | ORAL | 0 refills | Status: DC | PRN

## 2019-08-21 MED ORDER — ACETAMINOPHEN 325 MG PO TABS: 650.0000 mg | ORAL_TABLET | ORAL | 0 refills | Status: DC | PRN

## 2019-08-21 NOTE — Discharge Instructions (Signed)

## 2019-08-21 NOTE — Lactation Note (Signed)
This note was copied from a baby's chart. Lactation Consultation Note Mom stated baby has been fussy. She feels that the baby has a lot of gas. Mom stated baby has been cluster feeding. Experienced BF mom states BF going well, baby is just fussy. Encouraged mom to call for assistance if needed.  Patient Name: Carla [name] PTYYP'E Date: 08/21/2019 Reason for consult: Follow-up assessment;Early term 37-38.6wks   Maternal Data    Feeding Feeding Type: Breast Fed  LATCH Score Latch: Grasps breast easily, tongue down, lips flanged, rhythmical sucking.  Audible Swallowing: A few with stimulation  Type of Nipple: Everted at rest and after stimulation  Comfort (Breast/Nipple): Soft / non-tender  Hold (Positioning): No assistance needed to correctly position infant at breast.  LATCH Score: 9  Interventions    Lactation Tools Discussed/Used     Consult Status Consult Status: Follow-up Date: 08/21/19 Follow-up type: In-patient    Charyl Dancer 08/21/2019, 5:39 AM

## 2019-08-25 ENCOUNTER — Other Ambulatory Visit: Payer: Self-pay | Admitting: Family Medicine

## 2019-08-29 ENCOUNTER — Encounter: Payer: Self-pay | Admitting: Obstetrics & Gynecology

## 2019-08-29 ENCOUNTER — Other Ambulatory Visit: Payer: Self-pay

## 2019-08-29 ENCOUNTER — Ambulatory Visit (INDEPENDENT_AMBULATORY_CARE_PROVIDER_SITE_OTHER): Payer: 59 | Admitting: Obstetrics & Gynecology

## 2019-08-29 NOTE — Progress Notes (Signed)
1 week PP presents for elevated BP at home 146/96; 136/94; 131/101.  C/o headaches, [place], [place].

## 2019-08-29 NOTE — Progress Notes (Signed)
Subjective:     Carla [name] is a 29 y.o. female who presents for a postpartum visit. She is 1 week postpartum following a SVD and postpartum tubal the next day. I have fully reviewed the prenatal and intrapartum course. The delivery was at 38 gestational weeks. Outcome: spontaneous vaginal delivery. Anesthesia: epidural. Postpartum course has been uncomplicated exept for interrment Has. . Baby's course has been uncomplicate. Baby is feeding by breast. Bleeding no bleeding. Bowel function is normal. Bladder function is normal. Patient is sexually active. Contraception method is tubal ligation. Postpartum depression screening: negative.  The following portions of the patient's history were reviewed and updated as appropriate: allergies, current medications, past family history, past medical history, past social history, past surgical history and problem list.  Review of Systems Pertinent items are noted in HPI.   Objective:    BP 126/87   Pulse 79   Ht 5\' 4"  (1.626 m)   Wt 89.2 kg   LMP 11/26/2018   Breastfeeding Yes   BMI 33.75 kg/m   General:  alert and cooperative   Breasts:  deferred  Lungs: clear to auscultation bilaterally  Heart:  normal apical impulse  Abdomen: normal findings: no masses palpable   Vulva:  not evaluated        Assessment:     29yo G8P3 s/p SVD and PP Tubal here for postpartum exam. Pap smear not done at today's visit.   Plan:    1. Contraception: tubal ligation 2. Meeting postpartum goals, BP and symptoms discussed postpartum Pre E precautions given. 3. Follow up in: 4 weeks or as needed.

## 2019-09-05 ENCOUNTER — Telehealth: Payer: Self-pay

## 2019-09-05 NOTE — Telephone Encounter (Signed)
Returned call, pt stated that she has been crying all the time and does not really want to do anything with her children, patient's mother is currently with her and encouraged her to call. Edinburgh score= 22, patient denies wanting to harm self and others. Transferred to scheduler for appt.

## 2019-09-10 ENCOUNTER — Ambulatory Visit (INDEPENDENT_AMBULATORY_CARE_PROVIDER_SITE_OTHER): Payer: 59 | Admitting: Licensed Clinical Social Worker

## 2019-09-10 DIAGNOSIS — O99345 Other mental disorders complicating the puerperium: Secondary | ICD-10-CM | POA: Diagnosis not present

## 2019-09-10 DIAGNOSIS — F53 Postpartum depression: Secondary | ICD-10-CM | POA: Diagnosis not present

## 2019-09-10 NOTE — BH Specialist Note (Signed)
Integrated Behavioral Health via Telemedicine Video Visit  09/10/2019 Carla [name] [id number]  Number of Integrated Behavioral Health visits: 1 Session Start time: 10:00am  Session End time: 10:19am Total time: 19 mins   Referring Provider:  Patient self referred Type of Visit: Video Patient/Family location: Home  [facility] Provider location: [facility] All persons participating in visit: Carla [name] and Gwyndolyn Saxon   Confirmed patient's address: yes  Confirmed patient's phone number: yes Any changes to demographics: no   Confirmed patient's insurance: no Any changes to patient's insurance: no  Discussed confidentiality: yes  I connected with [name] by a video enabled telemedicine application and verified that I am speaking with the correct person using two identifiers.     I discussed the limitations of evaluation and management by telemedicine and the availability of in person appointments.  I discussed that the purpose of this visit is to provide behavioral health care while limiting exposure to the novel coronavirus.   Discussed there is a possibility of technology failure and discussed alternative modes of communication if that failure occurs.  I discussed that engaging in this video visit, they consent to the provision of behavioral healthcare and the services will be billed under their insurance.  Patient and/or legal guardian expressed understanding and consented to video visit: yes  PRESENTING CONCERNS: Patient and/or family reports the following symptoms/concerns: not bonding with newborn, crying, sleep deprivation, feelings of guilt Duration of problem: Since delivery ; Severity of problem: mild  STRENGTHS (Protective Factors/Coping Skills): Strong family support  Loves her children  Seeking services for postpartum depression.   GOALS ADDRESSED: Patient will: 1.  Reduce symptoms of: feelings of guilt, crying and not bonding with baby  2.  Increase  knowledge and/or ability of: Prioritize sleep, feelings of guilt and lack of bonding   3.  Demonstrate ability to: self manage symptoms   INTERVENTIONS: Interventions utilized:  Supportive counseling and psychoeducation regarding mindfulness techniques  Standardized Assessments completed: edinburgh score   ASSESSMENT: Patient currently experiencing postpartum depression   Patient may benefit from integrated behavioral health and medication management   PLAN: 1. Follow up with behavioral health clinician on : 09/30/2019 2. Behavioral recommendations: counseling and medication medication  3. Referral(s): [facility]   I discussed the assessment and treatment plan with the patient and/or parent/guardian. They were provided an opportunity to ask questions and all were answered. They agreed with the plan and demonstrated an understanding of the instructions.   They were advised to call back or seek an in-person evaluation if the symptoms worsen or if the condition fails to improve as anticipated.  Gwyndolyn Saxon

## 2019-09-30 ENCOUNTER — Ambulatory Visit (INDEPENDENT_AMBULATORY_CARE_PROVIDER_SITE_OTHER): Payer: 59 | Admitting: Licensed Clinical Social Worker

## 2019-09-30 ENCOUNTER — Telehealth (INDEPENDENT_AMBULATORY_CARE_PROVIDER_SITE_OTHER): Payer: 59

## 2019-09-30 DIAGNOSIS — O99345 Other mental disorders complicating the puerperium: Secondary | ICD-10-CM

## 2019-09-30 DIAGNOSIS — F53 Postpartum depression: Secondary | ICD-10-CM | POA: Insufficient documentation

## 2019-09-30 MED ORDER — SERTRALINE HCL 50 MG PO TABS: 50.0000 mg | ORAL_TABLET | Freq: Every day | ORAL | 1 refills | Status: DC

## 2019-09-30 NOTE — Patient Instructions (Signed)

## 2019-09-30 NOTE — Addendum Note (Signed)
Addended by: Gerrit Heck L on: 09/30/2019 03:08 PM   Modules accepted: Orders

## 2019-09-30 NOTE — BH Specialist Note (Signed)
Integrated Behavioral Health via Telemedicine Video Visit  09/30/2019 AMETHYST GAINER [id number]  Number of Integrated Behavioral Health visits: 2 Session Start time: 10:30am Session End time: 11:02am Total time: 32 mins via mychart and phone   Referring Provider:  Type of Visit: Video Patient/Family location: Home  [facility] Provider location: [facility] All persons participating in visit: CSW A.Claryce Friel and Pt CLaural Benes   Confirmed patient's address: yes Confirmed patient's phone number: yes Any changes to demographics: no  Confirmed patient's insurance: no  Any changes to patient's insurance: no  Discussed confidentiality: yes  I connected with [name] and/or Tanny L Nie's n/a by a video enabled telemedicine application and verified that I am speaking with the correct person using two identifiers.     I discussed the limitations of evaluation and management by telemedicine and the availability of in person appointments.  I discussed that the purpose of this visit is to provide behavioral health care while limiting exposure to the novel coronavirus.   Discussed there is a possibility of technology failure and discussed alternative modes of communication if that failure occurs.  I discussed that engaging in this video visit, they consent to the provision of behavioral healthcare and the services will be billed under their insurance.  Patient and/or legal guardian expressed understanding and consented to video visit: n/a  PRESENTING CONCERNS: Patient and/or family reports the following symptoms/concerns: Postpartum Depression, sleep deprivation, unable to bond with newborn and older children, feelings of guilt  Duration of problem: History of PPD and since recent delivery; Severity of problem: mild  STRENGTHS (Protective Factors/Coping Skills): Supportive family   GOALS ADDRESSED: Patient will: 1.  Reduce symptoms of: sleep deprivation, inability to bond with children  and feelings of guilt.   2.  Increase knowledge of diagnosis and interventions to alleviate symptoms to postpartum depression   3.  Demonstrate ability to: self manage symptoms   INTERVENTIONS: Interventions utilized:  CBT and supportive counseling  Standardized Assessments completed: n/a  ASSESSMENT: Patient currently experiencing postpartum depression   Patient may benefit from integrated behavioral health. Consulted with CNM Sabas Sous regarding pt postpartum symptoms. J Emly reports she will prescribe pt zoloft 50mg .   PLAN: 1. Follow up with behavioral health clinician on : 3 weeks  2. Behavioral recommendations: Prioritize sleep, take prescribed medication as directed,  3. Referral(s): none   I discussed the assessment and treatment plan with the patient and/or parent/guardian. They were provided an opportunity to ask questions and all were answered. They agreed with the plan and demonstrated an understanding of the instructions.   They were advised to call back or seek an in-person evaluation if the symptoms worsen or if the condition fails to improve as anticipated.  

## 2019-09-30 NOTE — Progress Notes (Addendum)
..    I connected with Carla [name] on 09/30/19 at  9:55 AM EDT by: mychart  video visit and verified that I am speaking with the correct person using two identifiers.  Patient is located at home and provider is located at [facility].     The purpose of this virtual visit is to provide medical care while limiting exposure to the novel coronavirus. I discussed the limitations, risks, security and privacy concerns of performing an evaluation and management service by video visit and the availability of in person appointments. I also discussed with the patient that there may be a patient responsible charge related to this service. By engaging in this virtual visit, you consent to the provision of healthcare.  Additionally, you authorize for your insurance to be billed for the services provided during this visit.  The patient expressed understanding and agreed to proceed.  The following staff members participated in the virtual visit:  J.Haylie Mccutcheon, CNM  Post Partum Visit Note Subjective:   Carla [name] is a 29 y.o. [phone] female being evaluated for postpartum followup.  She is 6 weeks postpartum following a normal spontaneous vaginal delivery at  38.0 gestational weeks.  I have fully reviewed the prenatal and intrapartum course; pregnancy complicated by hyperemesis.  Postpartum course has been complicated by postpartum depression . Baby is doing well. Baby is feeding by breast. Bleeding staining only. Bowel function is normal. Bladder function is normal. Patient is not sexually active. Contraception method is tubal ligation. Postpartum depression screening: positive.  The following portions of the patient's history were reviewed and updated as appropriate: allergies, current medications, past family history, past medical history, past social history, past surgical history and problem list.  Review of Systems Pertinent items noted in HPI and remainder of comprehensive ROS otherwise negative.    Objective:   Vitals:   09/30/19 0928  BP: (!) 129/98  Pulse: 82   Self-Obtained       Assessment:   6 week postpartum exam. Postpartum Depression Elevated BP  Plan:  Essential components of care per ACOG recommendations:  1.  Mood and well being: Patient with positive depression screening today. Scheduled for [facility] at 1030 Has not taken medications in past and reports good results with counseling.  -Denies SI/HI Behaviors -Endorses safety and good social support -Denies DV/A  - Patient does not use tobacco.   2. Infant care and feeding:  -Patient currently breastmilk feeding? Yes  If breastmilk feeding discussed return to work and pumping. If needed, patient was provided letter for work to allow for every 2-3 hr pumping breaks, and to be granted a private location to express breastmilk and refrigerated area to store breastmilk. Reviewed importance of draining breast regularly to support lactation. -Social determinants of health (SDOH) reviewed in EPIC. EDPS 16:  PPDepression; Scheduled with [facility] at 1030  3. Sexuality, contraception and birth spacing - Patient does not want a pregnancy in the next year.  Desired family size is 3 children.  - Reviewed forms of contraception in tiered fashion. Patient received immediate PP bilateral tubal ligation  4. Sleep and fatigue -Encouraged family/partner/community support of 4 hrs of uninterrupted sleep to help with mood and fatigue  5. Physical Recovery  - Discussed patients delivery and complications - Patient had a hemostatic labial laceration, perineal healing reviewed.  - Patient has urinary incontinence? No - Patient is safe to resume physical and sexual activity -Patient to return to work tomorrow.  6.  Health Maintenance - Last pap  smear done June 2019 and was normal with negative HPV.   7. Chronic Disease - Elevated BP today. -Reports HA yesterday that was relieved with tylenol -Instructed to take everyday x 3 days and  report any elevations for further evaluation.  10 minutes of non-face-to-face time spent with the patient    Cherre Robins, CNM [facility] [employer], [facility]

## 2019-10-14 ENCOUNTER — Encounter: Payer: Self-pay | Admitting: Certified Nurse Midwife

## 2019-10-14 ENCOUNTER — Telehealth (INDEPENDENT_AMBULATORY_CARE_PROVIDER_SITE_OTHER): Payer: 59 | Admitting: Certified Nurse Midwife

## 2019-10-14 ENCOUNTER — Ambulatory Visit (INDEPENDENT_AMBULATORY_CARE_PROVIDER_SITE_OTHER): Payer: 59 | Admitting: Licensed Clinical Social Worker

## 2019-10-14 DIAGNOSIS — O99345 Other mental disorders complicating the puerperium: Secondary | ICD-10-CM

## 2019-10-14 DIAGNOSIS — F53 Postpartum depression: Secondary | ICD-10-CM | POA: Diagnosis not present

## 2019-10-14 NOTE — Progress Notes (Signed)
    Carla [name] is a 29 y.o. [phone] female who presents for a postpartum mood check. She is 8 weeks postpartum following a normal spontaneous vaginal delivery at 38 gestational weeks. Contraception method is tubal ligation.  She was seen in 5/10 d/t postpartum depression and EPDS was 16. She was started on Zoloft and scheduled with [facility] which she has seen twice since initiation.  Patient reports she is feeling better, has some days where she is depressed but able to perform daily activities and take care of children. EPDS today is 8.   The following portions of the patient's history were reviewed and updated as appropriate: allergies, current medications, past medical history and problem list.  Review of Systems Pertinent items noted in HPI and remainder of comprehensive ROS otherwise negative.    Objective:  currently breastfeeding.  General:  alert, cooperative and no distress   Rest of examination deferred d/t virtual appointment  Assessment/Plan:  1. Postpartum depression - currently on Zoloft , which is helping  - educated and discussed that need for medication at this time does not mean need for medication for rest of life  - Discussed with patient we can reassess depression in 2-3 months to see if medication can be weaned off  - Encouraged patient to continue use of counseling  I provided 8 minutes of face-to-face time during this encounter.  Sharyon Cable, CNM [facility] [employer], [facility]

## 2019-10-14 NOTE — Progress Notes (Signed)
Virtual F/U visit for Depression.     EPDS = 8

## 2019-10-15 NOTE — BH Specialist Note (Signed)
Integrated Behavioral Health via Telemedicine Video Visit  10/15/2019 Carla [name] [id number]  Number of Integrated Behavioral Health visits: 3 Session Start time: 2:10pm  Session End time: 2:30pm Total time: 20 mins via mychart video   Referring Provider: n/a Type of Visit: Video Patient/Family location: Work  [facility] Provider location: [facility]  All persons participating in visit: C. Lope and LCSWA A. Porsha Skilton   Confirmed patient's address: yes Confirmed patient's phone number: yes Any changes to demographics: no   Confirmed patient's insurance: no Any changes to patient's insurance: no  Discussed confidentiality: yes  I connected with Carla [name] and/or Carla [name]'s n/a by a video enabled telemedicine application and verified that I am speaking with the correct person using two identifiers.     I discussed the limitations of evaluation and management by telemedicine and the availability of in person appointments.  I discussed that the purpose of this visit is to provide behavioral health care while limiting exposure to the novel coronavirus.   Discussed there is a possibility of technology failure and discussed alternative modes of communication if that failure occurs.  I discussed that engaging in this video visit, they consent to the provision of behavioral healthcare and the services will be billed under their insurance.  Patient and/or legal guardian expressed understanding and consented to video visit: yes   PRESENTING CONCERNS: Patient and/or family reports the following symptoms/concerns: difficulty bonding, sleep deprivation, crying, feelings of guilt Duration of problem: History of PPD and since recent delivery ; Severity of problem: moderate   STRENGTHS (Protective Factors/Coping Skills): Supportive Family   GOALS ADDRESSED: Patient will: 1.  Reduce symptoms of:  sleep deprivation, inability to bond with children and feelings of guilt 2.  Increase  knowledge of diagnosis and interventions to alleviate symptoms of postpartum depression   3.  Demonstrate ability to: self manage symptoms   INTERVENTIONS: Interventions utilized:  cbt and supportive counseling  Standardized Assessments completed: n/a  ASSESSMENT: Patient currently experiencing postpartum depression   Patient may benefit from integrated behavioral health   PLAN: 1. Follow up with behavioral health clinician on : 3 weeks via mychart  2. Behavioral recommendations: continue taking prescribe medication as directed, mindfulness techniques and stress reducing activities. I have recommended Ms. Erney to increase social interactions safely and practice self care  3. Referral(s):   I discussed the assessment and treatment plan with the patient and/or parent/guardian. They were provided an opportunity to ask questions and all were answered. They agreed with the plan and demonstrated an understanding of the instructions.   They were advised to call back or seek an in-person evaluation if the symptoms worsen or if the condition fails to improve as anticipated.  Gwyndolyn Saxon

## 2019-11-04 ENCOUNTER — Encounter: Payer: 59 | Admitting: Licensed Clinical Social Worker

## 2019-11-05 ENCOUNTER — Ambulatory Visit (INDEPENDENT_AMBULATORY_CARE_PROVIDER_SITE_OTHER)
Admission: RE | Admit: 2019-11-05 | Discharge: 2019-11-05 | Disposition: A | Discharge status: Home / Self Care | Payer: 59 | Source: Ambulatory Visit

## 2019-11-05 ENCOUNTER — Inpatient Hospital Stay: Admission: RE | Admit: 2019-11-05 | Payer: 59 | Source: Ambulatory Visit

## 2019-11-05 ENCOUNTER — Other Ambulatory Visit: Payer: Self-pay

## 2019-11-05 ENCOUNTER — Telehealth: Payer: 59

## 2019-11-05 DIAGNOSIS — M549 Dorsalgia, unspecified: Secondary | ICD-10-CM

## 2019-11-05 NOTE — ED Provider Notes (Signed)
Virtual Visit via Video Note:  Carla [name]  initiated request for Telemedicine visit with [facility] Urgent Care team. I connected with Carla [name]  on 11/05/2019 at 4:13 PM  for a synchronized telemedicine visit using a video enabled HIPPA compliant telemedicine application. I verified that I am speaking with Carla [name]  using two identifiers. Carla Bail, NP  was physically located in a [facility] Urgent care site and Carla [name] was located at a different location.   The limitations of evaluation and management by telemedicine as well as the availability of in-person appointments were discussed. Patient was informed that she  may incur a bill ( including co-pay) for this virtual visit encounter. Carla [name]  expressed understanding and gave verbal consent to proceed with virtual visit.     History of Present Illness:Carla [name]  is a 29 y.o. female presents for evaluation of 1 week history of muscle "tightness" in her upper back and neck.  No falls or injury.  No rash, lesions, bruising, redness, or other symptoms.  She states she used to take Flexeril for muscle spasms but stopped when these resolved.  She state her current pain feels like previous muscle spasms.  She recently returned to work after being out to have a baby; she is currently breastfeeding.     Allergies  Allergen Reactions  . Shellfish Allergy Anaphylaxis  . Latex Itching     Past Medical History:  Diagnosis Date  . Allergy   . Anemia   . Depression    postpartum  . Headache(784.0)   . Hyperemesis arising during pregnancy   . PONV (postoperative nausea and vomiting)   . Pregnancy induced hypertension   . Spontaneous abortion 12/03/2010     Social History   Tobacco Use  . Smoking status: Never Smoker  . Smokeless tobacco: Never Used  Vaping Use  . Vaping Use: Never used  Substance Use Topics  . Alcohol use: No    Alcohol/week: 0.0 standard drinks    Comment: Ocassionally  . Drug  use: No    ROS: as stated in HPI.  All other systems reviewed and negative.     Observations/Objective: Physical Exam  VITALS: Patient denies fever. GENERAL: Alert, appears well and in no acute distress. HEENT: Atraumatic. Oral mucosa appears moist. NECK: Normal movements of the head and neck. CARDIOPULMONARY: No increased WOB. Speaking in clear sentences. I:E ratio WNL.  MS: Moves all visible extremities without noticeable abnormality. PSYCH: Pleasant and cooperative, well-groomed. Speech normal rate and rhythm. Affect is appropriate. Insight and judgement are appropriate. Attention is focused, linear, and appropriate.  NEURO: CN grossly intact. Oriented as arrived to appointment on time with no prompting. Moves both UE equally.  SKIN: No obvious lesions, wounds, erythema, or cyanosis noted on face or hands.   Assessment and Plan:    ICD-10-CM   1. Upper back pain  M54.9        Follow Up Instructions: Patient is breastfeeding.  Patient states her pain feels like previous muscle spasms.  Instructed her to try symptomatic treatment with Tylenol, warm compresses, gentle massage of her muscles.  Instructed patient to follow-up with her PCP or come here to be seen in person if her symptoms or not improving.  Patient agrees to plan of care.      I discussed the assessment and treatment plan with the patient. The patient was provided an opportunity to ask questions and all were answered. The  patient agreed with the plan and demonstrated an understanding of the instructions.   The patient was advised to call back or seek an in-person evaluation if the symptoms worsen or if the condition fails to improve as anticipated.      Carla Balloon, NP  11/05/2019 4:13 PM         Carla Balloon, NP 11/05/19 [phone]

## 2019-11-05 NOTE — Discharge Instructions (Signed)
Take Tylenol as needed for discomfort.  Apply warm compresses to your muscles.  Gently massage the muscles.    Follow up with your primary care provider or come here to be seen in person if your symptoms are not improving.

## 2019-12-06 ENCOUNTER — Inpatient Hospital Stay: Admission: RE | Admit: 2019-12-06 | Payer: 59 | Source: Ambulatory Visit

## 2019-12-07 ENCOUNTER — Other Ambulatory Visit: Payer: Self-pay

## 2019-12-07 ENCOUNTER — Encounter (HOSPITAL_COMMUNITY): Payer: Self-pay

## 2019-12-07 ENCOUNTER — Ambulatory Visit (HOSPITAL_COMMUNITY)
Admission: RE | Admit: 2019-12-07 | Discharge: 2019-12-07 | Disposition: A | Discharge status: Home / Self Care | Payer: 59 | Source: Ambulatory Visit | Attending: Urgent Care | Admitting: Urgent Care

## 2019-12-07 VITALS — BP 148/104 | HR 76 | Temp 98.8°F | Resp 16

## 2019-12-07 DIAGNOSIS — J0191 Acute recurrent sinusitis, unspecified: Secondary | ICD-10-CM

## 2019-12-07 DIAGNOSIS — J310 Chronic rhinitis: Secondary | ICD-10-CM

## 2019-12-07 HISTORY — DX: Postpartum depression: F53.0

## 2019-12-07 MED ORDER — CETIRIZINE HCL 10 MG PO TABS: 10.0000 mg | ORAL_TABLET | Freq: Every day | ORAL | 0 refills | Status: DC

## 2019-12-07 MED ORDER — AMOXICILLIN 875 MG PO TABS: 875.0000 mg | ORAL_TABLET | Freq: Two times a day (BID) | ORAL | 0 refills | Status: DC

## 2019-12-07 NOTE — ED Provider Notes (Signed)
[place]   MRN: [id number] DOB: [birth year]  Subjective:   Carla [name] is a 29 y.o. female presenting for 2 day hx of acute sinus congestion, sinus pressure, body aches, fever. States that she normally gets sinus infections every year around this time of year.  Has a hx of allergies. Uses benadryl intermittently as needed, has not used this lately. Has tried Mucinex for her sinuses. Denies hx of asthma. Had COVID 19 testing yesterday.  Of note, patient is breastfeeding. Has been using ibuprofen for fevers at home.   No current facility-administered medications for this encounter.  Current Outpatient Medications:  .  calcium carbonate (CALCIUM 600) 600 MG TABS tablet, Take 600 mg by mouth 2 (two) times daily with a meal., Disp: , Rfl:  .  ondansetron (ZOFRAN-ODT) 4 MG disintegrating tablet, Take 1 tablet (4 mg total) by mouth every 6 (six) hours as needed. for nausea, Disp: 20 tablet, Rfl: 3 .  Prenatal Vit-Fe Fumarate-FA (PRENATAL MULTIVITAMIN) TABS tablet, Take 1 tablet by mouth daily at 12 noon., Disp: , Rfl:  .  acetaminophen (TYLENOL) 325 MG tablet, Take 2 tablets (650 mg total) by mouth every 4 (four) hours as needed (for pain scale < 4). (Patient not taking: Reported on 09/30/2019), Disp: 30 tablet, Rfl: 0 .  Blood Pressure Monitoring (BLOOD PRESSURE KIT) DEVI, 1 kit by Does not apply route once a week. CHECK BP WEEKLY.  LARGE CUFF. DX O09.0, Disp: 1 kit, Rfl: 0 .  doxylamine, Sleep, (UNISOM) 25 MG tablet, Take 25 mg by mouth at bedtime., Disp: , Rfl:  .  famotidine (PEPCID) 20 MG tablet, Take 1 tablet (20 mg total) by mouth 2 (two) times daily. (Patient not taking: Reported on 08/29/2019), Disp: 30 tablet, Rfl: 0 .  ferrous sulfate 325 (65 FE) MG tablet, Take 1 tablet (325 mg total) by mouth every other day., Disp: 45 tablet, Rfl: 3 .  oxyCODONE (OXY IR/ROXICODONE) 5 MG immediate release tablet, Take 1 tablet (5 mg total) by mouth every 4 (four) hours as needed for moderate  pain or severe pain. (Patient not taking: Reported on 09/30/2019), Disp: 30 tablet, Rfl: 0 .  pantoprazole (PROTONIX) 40 MG tablet, Take 1 tablet (40 mg total) by mouth daily. (Patient not taking: Reported on 09/30/2019), Disp: 30 tablet, Rfl: 1 .  promethazine (PHENERGAN) 25 MG tablet, Take 1 tablet (25 mg total) by mouth every 6 (six) hours as needed for nausea or vomiting. (Patient not taking: Reported on 08/29/2019), Disp: 30 tablet, Rfl: 2 .  pyridOXINE (VITAMIN B-6) 100 MG tablet, Take 200 mg by mouth at bedtime., Disp: , Rfl:  .  sertraline (ZOLOFT) 50 MG tablet, Take 1 tablet (50 mg total) by mouth daily., Disp: 45 tablet, Rfl: 1   Allergies  Allergen Reactions  . Shellfish Allergy Anaphylaxis  . Latex Itching    Past Medical History:  Diagnosis Date  . Allergy   . Anemia   . Depression    postpartum  . Headache(784.0)   . Hyperemesis arising during pregnancy   . PONV (postoperative nausea and vomiting)   . Postpartum depression   . Pregnancy induced hypertension   . Spontaneous abortion 12/03/2010     Past Surgical History:  Procedure Laterality Date  . CHOLECYSTECTOMY N/A 05/13/2015   Procedure: LAPAROSCOPIC CHOLECYSTECTOMY ;  Surgeon: Donnie Mesa, MD;  Location: [place];  Service: General;  Laterality: N/A;  . DILATION AND EVACUATION  12/03/2010   Procedure: DILATATION AND EVACUATION (D&E);  Surgeon:  Agnes Lawrence, MD;  Location: [facility] [facility];  Service: Gynecology;  Laterality: N/A;  . OVARIAN CYST REMOVAL    . TONSILLECTOMY    . TUBAL LIGATION N/A 08/20/2019   Procedure: POST PARTUM TUBAL LIGATION;  Surgeon: Truett Mainland, DO;  Location: [facility];  Service: Gynecology;  Laterality: N/A;    Family History  Problem Relation Age of Onset  . Arthritis Mother   . Cancer Mother   . Diabetes Mother   . Hearing loss Mother   . Hypertension Mother   . Obesity Mother   . Stroke Mother   . Hypertension Father   . Stroke Father   . Hypertension Other   . Diabetes Other    . Stroke Other   . Cancer Other   . Heart failure Other   . Seizures Other   . ADD / ADHD Brother   . Asthma Brother   . Learning disabilities Brother   . Alcohol abuse Paternal Aunt   . Alcohol abuse Paternal Uncle   . COPD Maternal Grandmother   . Cancer Maternal Grandfather     Social History   Tobacco Use  . Smoking status: Never Smoker  . Smokeless tobacco: Never Used  Vaping Use  . Vaping Use: Never used  Substance Use Topics  . Alcohol use: No  . Drug use: No    ROS   Objective:   Vitals: BP (!) 148/104   Pulse 76   Temp 98.8 F (37.1 C) (Oral)   Resp 16   SpO2 99%   Breastfeeding Yes   Physical Exam Constitutional:      General: She is not in acute distress.    Appearance: Normal appearance. She is well-developed. She is not ill-appearing, toxic-appearing or diaphoretic.  HENT:     Head: Normocephalic and atraumatic.     Right Ear: Tympanic membrane and ear canal normal. No drainage or tenderness. No middle ear effusion. Tympanic membrane is not erythematous.     Left Ear: Tympanic membrane and ear canal normal. No drainage or tenderness.  No middle ear effusion. Tympanic membrane is not erythematous.     Nose: Congestion present. No rhinorrhea.     Comments: Left-sided maxillary sinus tenderness.    Mouth/Throat:     Mouth: Mucous membranes are moist. No oral lesions.     Pharynx: Oropharynx is clear. No pharyngeal swelling, oropharyngeal exudate, posterior oropharyngeal erythema or uvula swelling.     Tonsils: No tonsillar exudate or tonsillar abscesses.  Eyes:     Extraocular Movements: Extraocular movements intact.     Right eye: Normal extraocular motion.     Left eye: Normal extraocular motion.     Conjunctiva/sclera: Conjunctivae normal.     Pupils: Pupils are equal, round, and reactive to light.  Cardiovascular:     Rate and Rhythm: Normal rate and regular rhythm.     Pulses: Normal pulses.     Heart sounds: Normal heart sounds. No  murmur heard.  No friction rub. No gallop.   Pulmonary:     Effort: Pulmonary effort is normal. No respiratory distress.     Breath sounds: Normal breath sounds. No stridor. No wheezing, rhonchi or rales.  Musculoskeletal:     Cervical back: Normal range of motion and neck supple.  Lymphadenopathy:     Cervical: No cervical adenopathy.  Skin:    General: Skin is warm and dry.     Findings: No rash.  Neurological:     General: No focal  deficit present.     Mental Status: She is alert and oriented to person, place, and time.  Psychiatric:        Mood and Affect: Mood normal.        Behavior: Behavior normal.        Thought Content: Thought content normal.      Assessment and Plan :   PDMP not reviewed this encounter.  1. Acute recurrent sinusitis, unspecified location   2. Chronic rhinitis     Start amoxicillin for acute on chronic recurrent sinusitis.  Emphasized need to follow-up with ENT.  Maintain Zyrtec once daily.  If symptoms fail to improve, consider steroid course.  This was not done today in light of her breast-feeding.  Counseled patient on potential for adverse effects with medications prescribed/recommended today, ER and return-to-clinic precautions discussed, patient verbalized understanding.    Jaynee Eagles, PA-C 12/07/19 1415

## 2019-12-07 NOTE — ED Triage Notes (Signed)
C/O sinus pressure and congestion with temp up to 101.9 x 2 days.  States had negative Covid test yesterday through [facility].  Has been taking decongestants.

## 2019-12-09 ENCOUNTER — Ambulatory Visit (INDEPENDENT_AMBULATORY_CARE_PROVIDER_SITE_OTHER)
Admission: RE | Admit: 2019-12-09 | Discharge: 2019-12-09 | Disposition: A | Discharge status: Home / Self Care | Payer: 59 | Source: Ambulatory Visit

## 2019-12-09 DIAGNOSIS — J329 Chronic sinusitis, unspecified: Secondary | ICD-10-CM

## 2019-12-09 MED ORDER — AMOXICILLIN-POT CLAVULANATE 875-125 MG PO TABS: 1.0000 | ORAL_TABLET | Freq: Two times a day (BID) | ORAL | 0 refills | Status: AC

## 2019-12-09 NOTE — ED Provider Notes (Signed)
[place]  Virtual Visit via Video Note:  Carla [name]  initiated request for Telemedicine visit with [facility] team. I connected with Carla [name]  on 12/09/2019 at 12:49 PM  for a synchronized telemedicine visit using a video enabled HIPPA compliant telemedicine application. I verified that I am speaking with Carla [name]  using two identifiers. Bettey Mare, NP  was physically located in a [facility] Urgent care site and Carla [name] was located at a different location.   The limitations of evaluation and management by telemedicine as well as the availability of in-person appointments were discussed. Patient was informed that she  may incur a bill ( including co-pay) for this virtual visit encounter. Carla [name]  expressed understanding and gave verbal consent to proceed with virtual visit.   [id number] 12/09/19 Arrival Time: 1224  CC: SINUSITIS  SUBJECTIVE: History from: patient.  Carla [name] is a 29 y.o. female who presents with abrupt onset of headache, cough, sinus pain and pressure, fatigue for the last 4 days.  Reports that she was seen at urgent care in [place] 2 days ago.  She was given amoxicillin.  Reports that she has taken 4 doses of amoxicillin, reports that when this has happened in the past she has started to feel better by dose 3 or 4.  She is breast-feeding her mother.  Denies sick exposures.  Reports previous symptoms in the past.     Denies fever, chills, ear pain, rhinorrhea, cough, SOB, wheezing, chest pain, nausea, rash, changes in bowel or bladder habits.    ROS: As per HPI.  All other pertinent ROS negative.     Past Medical History:  Diagnosis Date  . Allergy   . Anemia   . Depression    postpartum  . Headache(784.0)   . Hyperemesis arising during pregnancy   . PONV (postoperative nausea and vomiting)   . Postpartum depression   . Pregnancy induced hypertension   . Spontaneous abortion 12/03/2010    Past Surgical History:  Procedure Laterality Date  . CHOLECYSTECTOMY N/A 05/13/2015   Procedure: LAPAROSCOPIC CHOLECYSTECTOMY ;  Surgeon: Donnie Mesa, MD;  Location: [place];  Service: General;  Laterality: N/A;  . DILATION AND EVACUATION  12/03/2010   Procedure: DILATATION AND EVACUATION (D&E);  Surgeon: Agnes Lawrence, MD;  Location: [facility] [facility];  Service: Gynecology;  Laterality: N/A;  . OVARIAN CYST REMOVAL    . TONSILLECTOMY    . TUBAL LIGATION N/A 08/20/2019   Procedure: POST PARTUM TUBAL LIGATION;  Surgeon: Truett Mainland, DO;  Location: [facility];  Service: Gynecology;  Laterality: N/A;   Allergies  Allergen Reactions  . Shellfish Allergy Anaphylaxis  . Latex Itching   No current facility-administered medications on file prior to encounter.   Current Outpatient Medications on File Prior to Encounter  Medication Sig Dispense Refill  . acetaminophen (TYLENOL) 325 MG tablet Take 2 tablets (650 mg total) by mouth every 4 (four) hours as needed (for pain scale < 4). (Patient not taking: Reported on 09/30/2019) 30 tablet 0  . amoxicillin (AMOXIL) 875 MG tablet Take 1 tablet (875 mg total) by mouth 2 (two) times daily. 14 tablet 0  . Blood Pressure Monitoring (BLOOD PRESSURE KIT) DEVI 1 kit by Does not apply route once a week. CHECK BP WEEKLY.  LARGE CUFF. DX O09.0 1 kit 0  . calcium carbonate (CALCIUM 600) 600 MG TABS tablet Take 600 mg by mouth 2 (two) times  daily with a meal.    . cetirizine (ZYRTEC ALLERGY) 10 MG tablet Take 1 tablet (10 mg total) by mouth daily. 90 tablet 0  . doxylamine, Sleep, (UNISOM) 25 MG tablet Take 25 mg by mouth at bedtime.    . famotidine (PEPCID) 20 MG tablet Take 1 tablet (20 mg total) by mouth 2 (two) times daily. (Patient not taking: Reported on 08/29/2019) 30 tablet 0  . ferrous sulfate 325 (65 FE) MG tablet Take 1 tablet (325 mg total) by mouth every other day. 45 tablet 3  . ondansetron (ZOFRAN-ODT) 4 MG disintegrating tablet Take 1 tablet (4 mg  total) by mouth every 6 (six) hours as needed. for nausea 20 tablet 3  . oxyCODONE (OXY IR/ROXICODONE) 5 MG immediate release tablet Take 1 tablet (5 mg total) by mouth every 4 (four) hours as needed for moderate pain or severe pain. (Patient not taking: Reported on 09/30/2019) 30 tablet 0  . pantoprazole (PROTONIX) 40 MG tablet Take 1 tablet (40 mg total) by mouth daily. (Patient not taking: Reported on 09/30/2019) 30 tablet 1  . Prenatal Vit-Fe Fumarate-FA (PRENATAL MULTIVITAMIN) TABS tablet Take 1 tablet by mouth daily at 12 noon.    . promethazine (PHENERGAN) 25 MG tablet Take 1 tablet (25 mg total) by mouth every 6 (six) hours as needed for nausea or vomiting. (Patient not taking: Reported on 08/29/2019) 30 tablet 2  . pyridOXINE (VITAMIN B-6) 100 MG tablet Take 200 mg by mouth at bedtime.    . sertraline (ZOLOFT) 50 MG tablet Take 1 tablet (50 mg total) by mouth daily. 45 tablet 1   Social History   Socioeconomic History  . Marital status: Married    Spouse name: Not on file  . Number of children: Not on file  . Years of education: Not on file  . Highest education level: Not on file  Occupational History  . Not on file  Tobacco Use  . Smoking status: Never Smoker  . Smokeless tobacco: Never Used  Vaping Use  . Vaping Use: Never used  Substance and Sexual Activity  . Alcohol use: No  . Drug use: No  . Sexual activity: Yes    Partners: Male    Birth control/protection: Surgical  Other Topics Concern  . Not on file  Social History Narrative  . Not on file   Social Determinants of Health   Financial Resource Strain:   . Difficulty of Paying Living Expenses:   Food Insecurity:   . Worried About Charity fundraiser in the Last Year:   . Arboriculturist in the Last Year:   Transportation Needs:   . Film/video editor (Medical):   Marland Kitchen Lack of Transportation (Non-Medical):   Physical Activity:   . Days of Exercise per Week:   . Minutes of Exercise per Session:   Stress:   .  Feeling of Stress :   Social Connections:   . Frequency of Communication with Friends and Family:   . Frequency of Social Gatherings with Friends and Family:   . Attends Religious Services:   . Active Member of Clubs or Organizations:   . Attends Archivist Meetings:   Marland Kitchen Marital Status:   Intimate Partner Violence:   . Fear of Current or Ex-Partner:   . Emotionally Abused:   Marland Kitchen Physically Abused:   . Sexually Abused:    Family History  Problem Relation Age of Onset  . Arthritis Mother   . Cancer Mother   .  Diabetes Mother   . Hearing loss Mother   . Hypertension Mother   . Obesity Mother   . Stroke Mother   . Hypertension Father   . Stroke Father   . Hypertension Other   . Diabetes Other   . Stroke Other   . Cancer Other   . Heart failure Other   . Seizures Other   . ADD / ADHD Brother   . Asthma Brother   . Learning disabilities Brother   . Alcohol abuse Paternal Aunt   . Alcohol abuse Paternal Uncle   . COPD Maternal Grandmother   . Cancer Maternal Grandfather     OBJECTIVE:   There were no vitals filed for this visit.  General appearance: alert; no distress Eyes: EOMI grossly HENT: normocephalic; atraumatic Neck: supple with FROM Lungs: normal respiratory effort; speaking in full sentences without difficulty Extremities: moves extremities without difficulty Skin: No obvious rashes Neurologic: No facial asymmetries Psychological: alert and cooperative; normal mood and affect  ASSESSMENT & PLAN:  1. Sinusitis, unspecified chronicity, unspecified location     Meds ordered this encounter  Medications  . amoxicillin-clavulanate (AUGMENTIN) 875-125 MG tablet    Sig: Take 1 tablet by mouth 2 (two) times daily for 10 days.    Dispense:  20 tablet    Refill:  0    Order Specific Question:   Supervising Provider    Answer:   Chase Picket [id number]    Stop amoxicillin Push fluids and get rest Prescribed Augmentin twice daily for 10 days.     Take as directed and to completion.  Drink warm or cool liquids, use throat lozenges, or popsicles to help alleviate symptoms Take OTC ibuprofen or tylenol as needed for pain Follow up with PCP if symptoms persist Return or go to ER if you have any new or worsening symptoms such as fever, chills, nausea, vomiting, worsening sore throat, cough, abdominal pain, chest pain, changes in bowel or bladder habits  I discussed the assessment and treatment plan with the patient. The patient was provided an opportunity to ask questions and all were answered. The patient agreed with the plan and demonstrated an understanding of the instructions.   The patient was advised to call back or seek an in-person evaluation if the symptoms worsen or if the condition fails to improve as anticipated.  I provided 10 minutes of non-face-to-face time during this encounter.  Bettey Mare, NP  12/09/2019 12:49 PM         Faustino Congress, NP 12/09/19 1249

## 2019-12-09 NOTE — Discharge Instructions (Signed)
You have a sinus infection.  I have prescribed Augmentin   Follow up with this office or with primary care if you are not feeling better over the next 2 days.  Follow up with the ER for trouble swallowing, trouble breathing, other concerning symptoms.

## 2019-12-20 ENCOUNTER — Telehealth (INDEPENDENT_AMBULATORY_CARE_PROVIDER_SITE_OTHER): Payer: 59 | Admitting: Certified Nurse Midwife

## 2019-12-20 DIAGNOSIS — F329 Major depressive disorder, single episode, unspecified: Secondary | ICD-10-CM

## 2019-12-20 DIAGNOSIS — F419 Anxiety disorder, unspecified: Secondary | ICD-10-CM | POA: Diagnosis not present

## 2019-12-20 DIAGNOSIS — F32A Depression, unspecified: Secondary | ICD-10-CM

## 2019-12-20 MED ORDER — BUPROPION HCL ER (XL) 150 MG PO TB24: 150.0000 mg | ORAL_TABLET | Freq: Every day | ORAL | 2 refills | Status: DC

## 2019-12-20 NOTE — Progress Notes (Signed)
GYN Depression FU, c/o being stressed, anxious, overwhelmed, crying, overeating. I encouraged patient to start exercising, going to the park, run around with her children to get natural Vitamin D, and drink lots of water.

## 2019-12-20 NOTE — Progress Notes (Signed)
GYNECOLOGY VIRTUAL VISIT ENCOUNTER NOTE  Provider location: [facility] [place]   I connected with Carla [name] on 12/20/19 at 09:40 AM EDT by MyChart Video Encounter at home and verified that I am speaking with the correct person using two identifiers.   I discussed the limitations, risks, security and privacy concerns of performing an evaluation and management service virtually and the availability of in person appointments. I also discussed with the patient that there may be a patient responsible charge related to this service. The patient expressed understanding and agreed to proceed.   History:  Carla [name] is a 29 y.o. [phone] female being evaluated today for depression and anxiety. Patient reports that she stopped taking zoloft 3 weeks ago d/t having suicidal thoughts. Patient denies having suicidal thoughts or a plan at this time. Patient reports 1 week ago she started being more stressed, anxious, crying and overeating. She denies any GYN concerns.      Past Medical History:  Diagnosis Date  . Allergy   . Anemia   . Depression    postpartum  . Headache(784.0)   . Hyperemesis arising during pregnancy   . PONV (postoperative nausea and vomiting)   . Postpartum depression   . Pregnancy induced hypertension   . Spontaneous abortion 12/03/2010   Past Surgical History:  Procedure Laterality Date  . CHOLECYSTECTOMY N/A 05/13/2015   Procedure: LAPAROSCOPIC CHOLECYSTECTOMY ;  Surgeon: Manus Rudd, MD;  Location: [facility] OR;  Service: General;  Laterality: N/A;  . DILATION AND EVACUATION  12/03/2010   Procedure: DILATATION AND EVACUATION (D&E);  Surgeon: Roseanna Rainbow, MD;  Location: [facility] ORS;  Service: Gynecology;  Laterality: N/A;  . OVARIAN CYST REMOVAL    . TONSILLECTOMY    . TUBAL LIGATION N/A 08/20/2019   Procedure: POST PARTUM TUBAL LIGATION;  Surgeon: [name], DO;  Location: [facility] LD ORS;  Service: Gynecology;  Laterality: N/A;   The  following portions of the patient's history were reviewed and updated as appropriate: allergies, current medications, past family history, past medical history, past social history, past surgical history and problem list.   Review of Systems:  Pertinent items noted in HPI and remainder of comprehensive ROS otherwise negative.  Physical Exam:   General:  Alert, oriented and cooperative. Patient appears to be in no acute distress.  Mental Status: Normal mood and affect. Normal behavior. Normal judgment and thought content.   Respiratory: Normal respiratory effort, no problems with respiration noted  Rest of physical exam deferred due to type of encounter   Assessment and Plan:     1. 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  - Discussed other options of medications outside of SSRIs, patient is open to medication at this time  - Educated on Wellbutrin, discussed with patient that after initiation can have worsening symptoms but usually resolve after 1 week. Discussed with patient that if she starts to have SI or HI needs to call office immediately, patient verbalizes understanding.  - Patient also does not want to get vaccine while she is breastfeeding. Discussed with patient to obtain necessary paperwork from [facility], schedule appointment with office to be able to discuss risk and benefits then complete paperwork if she continues to decline. Patient reports that she does not have a problem getting the vaccine just does not want to do it while breastfeeding because  there is not a lot of research about what the risks are in the future to the newborn.        I discussed the assessment and treatment plan with the patient. The patient was provided an opportunity to ask questions and all were answered. The patient agreed with the plan and demonstrated an understanding of the  instructions.   The patient was advised to call back or seek an in-person evaluation/go to the ED if the symptoms worsen or if the condition fails to improve as anticipated.  I provided 15 minutes of face-to-face time during this encounter.   Sharyon Cable, CNM [facility] for [employer], [facility]

## 2019-12-23 ENCOUNTER — Ambulatory Visit: Payer: 59 | Admitting: Obstetrics

## 2020-04-29 ENCOUNTER — Encounter: Payer: Self-pay | Admitting: General Practice

## 2020-06-04 ENCOUNTER — Telehealth: Payer: 59 | Admitting: Family

## 2020-06-04 DIAGNOSIS — J209 Acute bronchitis, unspecified: Secondary | ICD-10-CM

## 2020-06-04 MED ORDER — ALBUTEROL SULFATE HFA 108 (90 BASE) MCG/ACT IN AERS: 2.0000 | INHALATION_SPRAY | Freq: Four times a day (QID) | RESPIRATORY_TRACT | 0 refills | Status: DC | PRN

## 2020-06-04 MED ORDER — BENZONATATE 100 MG PO CAPS: 100.0000 mg | ORAL_CAPSULE | Freq: Two times a day (BID) | ORAL | 0 refills | Status: DC | PRN

## 2020-06-04 MED ORDER — PREDNISONE 20 MG PO TABS: 20.0000 mg | ORAL_TABLET | Freq: Every day | ORAL | 0 refills | Status: DC

## 2020-06-04 MED ORDER — AZITHROMYCIN 250 MG PO TABS: ORAL_TABLET | ORAL | 0 refills | Status: DC

## 2020-06-04 NOTE — Addendum Note (Signed)
Addended by: Eustace Moore on: 06/04/2020 09:52 AM   Modules accepted: Orders

## 2020-06-04 NOTE — Progress Notes (Signed)
We are sorry that you are not feeling well.  Here is how we plan to help!  Based on your presentation I believe you most likely have A cough due to bacteria.  When patients have a fever and a productive cough with a change in color or increased sputum production, we are concerned about bacterial bronchitis.  If left untreated it can progress to pneumonia.  If your symptoms do not improve with your treatment plan it is important that you contact your provider.   I have prescribed Azithromyin 250 mg: two tablets now and then one tablet daily for 4 additonal days    In addition you may use A prescription cough medication called Tessalon Perles 100mg . You may take 1-2 capsules every 8 hours as needed for your cough.  I am also going to put you on prednisone 20 mg daily for the next 5 days.   I am concerned that you may have developed a secondary bacterial infection from the COVID and want to go ahead and get your treatment started. If you don't start to feel better in the next 24-48 hours, please consider being seen to get a Chest X-ray at an [facility].   From your responses in the eVisit questionnaire you describe inflammation in the upper respiratory tract which is causing a significant cough.  This is commonly called Bronchitis and has four common causes:    Allergies  Viral Infections  Acid Reflux  Bacterial Infection Allergies, viruses and acid reflux are treated by controlling symptoms or eliminating the cause. An example might be a cough caused by taking certain blood pressure medications. You stop the cough by changing the medication. Another example might be a cough caused by acid reflux. Controlling the reflux helps control the cough.  USE OF BRONCHODILATOR ("RESCUE") INHALERS: There is a risk from using your bronchodilator too frequently.  The risk is that over-reliance on a medication which only relaxes the muscles surrounding the breathing tubes can reduce the effectiveness of medications  prescribed to reduce swelling and congestion of the tubes themselves.  Although you feel brief relief from the bronchodilator inhaler, your asthma may actually be worsening with the tubes becoming more swollen and filled with mucus.  This can delay other crucial treatments, such as oral steroid medications. If you need to use a bronchodilator inhaler daily, several times per day, you should discuss this with your provider.  There are probably better treatments that could be used to keep your asthma under control.     HOME CARE . Only take medications as instructed by your medical team. . Complete the entire course of an antibiotic. . Drink plenty of fluids and get plenty of rest. . Avoid close contacts especially the very young and the elderly . Cover your mouth if you cough or cough into your sleeve. . Always remember to wash your hands . A steam or ultrasonic humidifier can help congestion.   GET HELP RIGHT AWAY IF: . You develop worsening fever. . You become short of breath . You cough up blood. . Your symptoms persist after you have completed your treatment plan MAKE SURE YOU   Understand these instructions.  Will watch your condition.  Will get help right away if you are not doing well or get worse.  Your e-visit answers were reviewed by a board certified advanced clinical practitioner to complete your personal care plan.  Depending on the condition, your plan could have included both over the counter or prescription medications. If  there is a problem please reply  once you have received a response from your provider. Your safety is important to [place].  If you have drug allergies check your prescription carefully.    You can use MyChart to ask questions about today's visit, request a non-urgent call back, or ask for a work or school excuse for 24 hours related to this e-Visit. If it has been greater than 24 hours you will need to follow up with your provider, or enter a new e-Visit to  address those concerns. You will get an e-mail in the next two days asking about your experience.  I hope that your e-visit has been valuable and will speed your recovery. Thank you for using e-visits.  Greater than 5 minutes, yet less than 10 minutes of time have been spent researching, coordinating, and implementing care for this patient.

## 2020-06-26 ENCOUNTER — Other Ambulatory Visit: Payer: Self-pay | Admitting: Family

## 2020-09-09 ENCOUNTER — Telehealth: Payer: 59 | Admitting: Physician Assistant

## 2020-09-09 ENCOUNTER — Encounter: Payer: Self-pay | Admitting: Physician Assistant

## 2020-09-09 DIAGNOSIS — K2900 Acute gastritis without bleeding: Secondary | ICD-10-CM | POA: Diagnosis not present

## 2020-09-09 MED ORDER — PANTOPRAZOLE SODIUM 40 MG PO TBEC: 40.0000 mg | DELAYED_RELEASE_TABLET | Freq: Every day | ORAL | 0 refills | Status: DC

## 2020-09-09 MED ORDER — SUCRALFATE 1 GM/10ML PO SUSP: 1.0000 g | Freq: Three times a day (TID) | ORAL | 0 refills | Status: DC

## 2020-09-09 NOTE — Patient Instructions (Signed)
Instructions sent to patients MyChart.

## 2020-09-09 NOTE — Progress Notes (Signed)
Ms. rosealyn, little are scheduled for a virtual visit with your provider today.    Just as we do with appointments in the office, we must obtain your consent to participate.  Your consent will be active for this visit and any virtual visit you may have with one of our providers in the next 365 days.    If you have a MyChart account, I can also send a copy of this consent to you electronically.  All virtual visits are billed to your insurance company just like a traditional visit in the office.  As this is a virtual visit, video technology does not allow for your provider to perform a traditional examination.  This may limit your provider's ability to fully assess your condition.  If your provider identifies any concerns that need to be evaluated in person or the need to arrange testing such as labs, EKG, etc, we will make arrangements to do so.    Although advances in technology are sophisticated, we cannot ensure that it will always work on either your end or our end.  If the connection with a video visit is poor, we may have to switch to a telephone visit.  With either a video or telephone visit, we are not always able to ensure that we have a secure connection.   I need to obtain your verbal consent now.   Are you willing to proceed with your visit today?   ISIDRA MINGS has provided verbal consent on 09/09/2020 for a virtual visit (video or telephone).   Piedad Climes, PA-C 09/09/2020  12:44 PM  Virtual Visit via Video   I connected with patient on 09/09/20 at  1:00 PM EDT by a video enabled telemedicine application and verified that I am speaking with the correct person using two identifiers.  Location patient: Home Location provider: [facility] Persons participating in the virtual visit: Patient, Provider  I discussed the limitations of evaluation and management by telemedicine and the availability of in person appointments. The patient expressed understanding and  agreed to proceed.  Subjective:   HPI:  Patient presents via [facility] today c/o nausea and severe heartburn starting yesterday. States she gets these episodes every once in a while since her cholecystectomy in 2017. Notes one episode of non-bloody emesis yesterday. No recurrence. Nausea is completely resolved. Still having significant heartburn with some LUQ tenderness. Notes loose stool yesterday that has returned to normal. Denies melena, hematochezia or tenesmus. Did take GAs-X and Zofran last night which helped her symptoms. Denies NSAID use or alcohol consumption. Did recently start a medication with an outside bariatric clinic that she thinks may be contributing. She cannot tell me what that medication is at present.  She denies fever, chills, aches. Denies recent travel or sick contact.    ROS:   See pertinent positives and negatives per HPI.  Patient Active Problem List   Diagnosis Date Noted  . Postpartum depression 09/30/2019  . Chronic hypertension in obstetric context, third trimester 08/19/2019  . SVD (spontaneous vaginal delivery) 08/19/2019    Social History   Tobacco Use  . Smoking status: Never Smoker  . Smokeless tobacco: Never Used  Substance Use Topics  . Alcohol use: No    Current Outpatient Medications:  .  acetaminophen (TYLENOL) 325 MG tablet, Take 2 tablets (650 mg total) by mouth every 4 (four) hours as needed (for pain scale < 4). (Patient not taking: Reported on 09/30/2019), Disp: 30 tablet, Rfl: 0 .  albuterol (VENTOLIN HFA) 108 (90 Base) MCG/ACT inhaler, Inhale 2 puffs into the lungs every 6 (six) hours as needed for wheezing or shortness of breath., Disp: 8 g, Rfl: 0 .  azithromycin (ZITHROMAX) 250 MG tablet, 2 tabs po qd x 1 day; 1 tablet per day x 4 days;, Disp: 6 tablet, Rfl: 0 .  benzonatate (TESSALON) 100 MG capsule, Take 1 capsule (100 mg total) by mouth 2 (two) times daily as needed for cough., Disp: 20 capsule, Rfl: 0 .  Blood Pressure  Monitoring (BLOOD PRESSURE KIT) DEVI, 1 kit by Does not apply route once a week. CHECK BP WEEKLY.  LARGE CUFF. DX O09.0, Disp: 1 kit, Rfl: 0 .  buPROPion (WELLBUTRIN XL) 150 MG 24 hr tablet, Take 1 tablet (150 mg total) by mouth daily., Disp: 30 tablet, Rfl: 2 .  calcium carbonate (CALCIUM 600) 600 MG TABS tablet, Take 600 mg by mouth 2 (two) times daily with a meal., Disp: , Rfl:  .  cetirizine (ZYRTEC ALLERGY) 10 MG tablet, Take 1 tablet (10 mg total) by mouth daily., Disp: 90 tablet, Rfl: 0 .  doxylamine, Sleep, (UNISOM) 25 MG tablet, Take 25 mg by mouth at bedtime. (Patient not taking: Reported on 12/20/2019), Disp: , Rfl:  .  famotidine (PEPCID) 20 MG tablet, Take 1 tablet (20 mg total) by mouth 2 (two) times daily. (Patient not taking: Reported on 08/29/2019), Disp: 30 tablet, Rfl: 0 .  ferrous sulfate 325 (65 FE) MG tablet, Take 1 tablet (325 mg total) by mouth every other day. (Patient not taking: Reported on 12/20/2019), Disp: 45 tablet, Rfl: 3 .  ondansetron (ZOFRAN-ODT) 4 MG disintegrating tablet, Take 1 tablet (4 mg total) by mouth every 6 (six) hours as needed. for nausea (Patient not taking: Reported on 12/20/2019), Disp: 20 tablet, Rfl: 3 .  oxyCODONE (OXY IR/ROXICODONE) 5 MG immediate release tablet, Take 1 tablet (5 mg total) by mouth every 4 (four) hours as needed for moderate pain or severe pain. (Patient not taking: Reported on 09/30/2019), Disp: 30 tablet, Rfl: 0 .  pantoprazole (PROTONIX) 40 MG tablet, Take 1 tablet (40 mg total) by mouth daily. (Patient not taking: Reported on 09/30/2019), Disp: 30 tablet, Rfl: 1 .  predniSONE (DELTASONE) 20 MG tablet, Take 1 tablet (20 mg total) by mouth daily with breakfast., Disp: 5 tablet, Rfl: 0 .  Prenatal Vit-Fe Fumarate-FA (PRENATAL MULTIVITAMIN) TABS tablet, Take 1 tablet by mouth daily at 12 noon., Disp: , Rfl:  .  promethazine (PHENERGAN) 25 MG tablet, Take 1 tablet (25 mg total) by mouth every 6 (six) hours as needed for nausea or vomiting.  (Patient not taking: Reported on 08/29/2019), Disp: 30 tablet, Rfl: 2 .  pyridOXINE (VITAMIN B-6) 100 MG tablet, Take 200 mg by mouth at bedtime. (Patient not taking: Reported on 12/20/2019), Disp: , Rfl:   Allergies  Allergen Reactions  . Shellfish Allergy Anaphylaxis  . Latex Itching    Objective:   There were no vitals taken for this visit.  Patient is well-developed, well-nourished in no acute distress.  Resting comfortably at home.  Head is normocephalic, atraumatic.  No labored breathing.  Speech is clear and coherent with logical content.  Patient is alert and oriented at baseline.   Assessment and Plan:   1. Acute gastritis without hemorrhage, unspecified gastritis type Absence of fever or alarm signs/symptoms. Will start patient on Protonix 40 mg daily for the next 2 weeks. GERD and bland diets reviewed with patient. She has been encouraged to  start a daily probiotic. Rx Carafate to use TID for the next couple of days to promote healing of the stomach lining. Strict PCP and ER precautions both reviewed with patient who voiced understanding and agreement with the plan.     [name], [place] 09/09/2020

## 2020-10-21 ENCOUNTER — Telehealth: Payer: 59 | Admitting: Physician Assistant

## 2020-10-21 ENCOUNTER — Encounter: Payer: Self-pay | Admitting: Physician Assistant

## 2020-10-21 DIAGNOSIS — M62838 Other muscle spasm: Secondary | ICD-10-CM

## 2020-10-21 MED ORDER — IBUPROFEN 800 MG PO TABS: 800.0000 mg | ORAL_TABLET | Freq: Three times a day (TID) | ORAL | 0 refills | Status: DC | PRN

## 2020-10-21 MED ORDER — CYCLOBENZAPRINE HCL 5 MG PO TABS: 5.0000 mg | ORAL_TABLET | Freq: Three times a day (TID) | ORAL | 1 refills | Status: DC | PRN

## 2020-10-21 NOTE — Progress Notes (Signed)
[name], cerra are scheduled for a virtual visit with your provider today.    Just as we do with appointments in the office, we must obtain your consent to participate.  Your consent will be active for this visit and any virtual visit you may have with one of our providers in the next 365 days.    If you have a MyChart account, I can also send a copy of this consent to you electronically.  All virtual visits are billed to your insurance company just like a traditional visit in the office.  As this is a virtual visit, video technology does not allow for your provider to perform a traditional examination.  This may limit your provider's ability to fully assess your condition.  If your provider identifies any concerns that need to be evaluated in person or the need to arrange testing such as labs, EKG, etc, we will make arrangements to do so.    Although advances in technology are sophisticated, we cannot ensure that it will always work on either your end or our end.  If the connection with a video visit is poor, we may have to switch to a telephone visit.  With either a video or telephone visit, we are not always able to ensure that we have a secure connection.   I need to obtain your verbal consent now.   Are you willing to proceed with your visit today?   [name] has provided verbal consent on 10/21/2020 for a virtual visit (video or telephone).   Mar Daring, PA-C 10/21/2020  4:42 PM    MyChart Video Visit    Virtual Visit via Video Note   This visit type was conducted due to national recommendations for restrictions regarding the COVID-19 Pandemic (e.g. social distancing) in an effort to limit this patient's exposure and mitigate transmission in our community. This patient is at least at moderate risk for complications without adequate follow up. This format is felt to be most appropriate for this patient at this time. Physical exam was limited by quality of the video and audio  technology used for the visit.   Patient location: Work, Contractor location: Home office in [place]  I discussed the limitations of evaluation and management by telemedicine and the availability of in person appointments. The patient expressed understanding and agreed to proceed.  Patient: Carla [name]   DOB: 14-Jan-1991   30 y.o. Female  MRN: [id number] Visit Date: 10/21/2020  Today's healthcare provider: Mar Daring, PA-C   No chief complaint on file.  Subjective    HPI  Carla [name] is a 30 yr old female that presents today via [place] for upper back and neck pain. Pain is predominantly on the left side. It started approximately 4-5 days ago. She awoke with the tightness and difficulty turning her head.  Does have mild headache from muscle tension. She has been taking Ibuprofen without relief. She has had this happen before multiple times. Has used Cyclobenzaprine successfully in the past. Has had one occurrence where it was more severe, more like torticollis (unable to move head at all and was having referring symptoms including numbness and tingling in her arm and hand) that did require an injection to release the muscle. She reports this time is not as severe as that. No numbness or tingling into arm or hand.   Patient Active Problem List   Diagnosis Date Noted  . Postpartum depression 09/30/2019  . Chronic hypertension in obstetric  context, third trimester 08/19/2019  . SVD (spontaneous vaginal delivery) 08/19/2019   Past Medical History:  Diagnosis Date  . Allergy   . Anemia   . Depression    postpartum  . Headache(784.0)   . Hyperemesis arising during pregnancy   . PONV (postoperative nausea and vomiting)   . Postpartum depression   . Pregnancy induced hypertension   . Spontaneous abortion 12/03/2010      Medications: Outpatient Medications Prior to Visit  Medication Sig  . albuterol (VENTOLIN HFA) 108 (90 Base) MCG/ACT inhaler Inhale 2  puffs into the lungs every 6 (six) hours as needed for wheezing or shortness of breath.  . Blood Pressure Monitoring (BLOOD PRESSURE KIT) DEVI 1 kit by Does not apply route once a week. CHECK BP WEEKLY.  LARGE CUFF. DX O09.0  . calcium carbonate (CALCIUM 600) 600 MG TABS tablet Take 600 mg by mouth 2 (two) times daily with a meal.  . cetirizine (ZYRTEC ALLERGY) 10 MG tablet Take 1 tablet (10 mg total) by mouth daily.  . pantoprazole (PROTONIX) 40 MG tablet Take 1 tablet (40 mg total) by mouth daily.  . Prenatal Vit-Fe Fumarate-FA (PRENATAL MULTIVITAMIN) TABS tablet Take 1 tablet by mouth daily at 12 noon.  . sucralfate (CARAFATE) 1 GM/10ML suspension Take 10 mLs (1 g total) by mouth 4 (four) times daily -  with meals and at bedtime.   No facility-administered medications prior to visit.    Review of Systems  Constitutional: Negative.   Respiratory: Negative.   Cardiovascular: Negative.   Musculoskeletal: Positive for myalgias and neck stiffness.  Neurological: Negative for weakness and numbness.    Last CBC Lab Results  Component Value Date   WBC 9.9 08/19/2019   HGB 9.7 (L) 08/19/2019   HCT 30.9 (L) 08/19/2019   MCV 84.7 08/19/2019   MCH 26.6 08/19/2019   RDW 16.0 (H) 08/19/2019   PLT 293 21/19/4174   Last metabolic panel Lab Results  Component Value Date   GLUCOSE 92 08/19/2019   NA 136 08/19/2019   K 2.8 (L) 08/19/2019   CL 105 08/19/2019   CO2 19 (L) 08/19/2019   BUN <5 (L) 08/19/2019   CREATININE 0.60 08/19/2019   GFRNONAA >60 08/19/2019   GFRAA >60 08/19/2019   CALCIUM 8.5 (L) 08/19/2019   PROT 6.0 (L) 08/19/2019   ALBUMIN 2.6 (L) 08/19/2019   LABGLOB 2.3 02/08/2019   AGRATIO 1.8 02/08/2019   BILITOT 0.8 08/19/2019   ALKPHOS 123 08/19/2019   AST 37 08/19/2019   ALT 32 08/19/2019   ANIONGAP 12 08/19/2019      Objective    There were no vitals taken for this visit. BP Readings from Last 3 Encounters:  12/07/19 (!) 148/104  09/30/19 (!) 129/98  08/29/19  126/87   Wt Readings from Last 3 Encounters:  08/29/19 196 lb 9.6 oz (89.2 kg)  08/19/19 205 lb (93 kg)  08/12/19 205 lb 12.8 oz (93.4 kg)      Physical Exam Vitals reviewed.  Constitutional:      General: She is not in acute distress.    Appearance: Normal appearance. She is well-developed. She is not ill-appearing.  HENT:     Head: Normocephalic and atraumatic.  Neck:     Comments: Patient able to move neck without issue but reports feeling pulling sensation Pulmonary:     Effort: Pulmonary effort is normal. No respiratory distress.  Musculoskeletal:     Cervical back: Normal range of motion and neck supple. No rigidity.  Neurological:     Mental Status: She is alert.  Psychiatric:        Behavior: Behavior normal.        Thought Content: Thought content normal.        Judgment: Judgment normal.        Assessment & Plan     1. Neck muscle spasm - Recurrent issue - Flexeril and Ibuprofen prescribed as below - Advised can use heating pad or moist heat to area - Neck exercises and stretches sent via AVS - Massage can be beneficial as well - cyclobenzaprine (FLEXERIL) 5 MG tablet; Take 1 tablet (5 mg total) by mouth 3 (three) times daily as needed for muscle spasms.  Dispense: 30 tablet; Refill: 1 - ibuprofen (ADVIL) 800 MG tablet; Take 1 tablet (800 mg total) by mouth every 8 (eight) hours as needed.  Dispense: 30 tablet; Refill: 0   No follow-ups on file.     I discussed the assessment and treatment plan with the patient. The patient was provided an opportunity to ask questions and all were answered. The patient agreed with the plan and demonstrated an understanding of the instructions.   The patient was advised to call back or seek an in-person evaluation if the symptoms worsen or if the condition fails to improve as anticipated.  I provided 12 minutes of face-to-face time during this encounter via MyChart Video enabled encounter.  [place] [phone] (phone) [phone] (fax)  [place]

## 2020-10-21 NOTE — Patient Instructions (Signed)

## 2020-10-24 ENCOUNTER — Telehealth: Payer: 59 | Admitting: Nurse Practitioner

## 2020-10-24 ENCOUNTER — Encounter: Payer: Self-pay | Admitting: Nurse Practitioner

## 2020-10-24 DIAGNOSIS — J069 Acute upper respiratory infection, unspecified: Secondary | ICD-10-CM | POA: Diagnosis not present

## 2020-10-24 MED ORDER — BENZONATATE 100 MG PO CAPS: 100.0000 mg | ORAL_CAPSULE | Freq: Three times a day (TID) | ORAL | 0 refills | Status: DC | PRN

## 2020-10-24 MED ORDER — PREDNISONE 20 MG PO TABS: 40.0000 mg | ORAL_TABLET | Freq: Every day | ORAL | 0 refills | Status: AC

## 2020-10-24 NOTE — Progress Notes (Signed)
[name], Carla [name] are scheduled for a virtual visit with your provider today.    Just as we do with appointments in the office, we must obtain your consent to participate.  Your consent will be active for this visit and any virtual visit you may have with one of our providers in the next 365 days.    If you have a MyChart account, I can also send a copy of this consent to you electronically.  All virtual visits are billed to your insurance company just like a traditional visit in the office.  As this is a virtual visit, video technology does not allow for your provider to perform a traditional examination.  This may limit your provider's ability to fully assess your condition.  If your provider identifies any concerns that need to be evaluated in person or the need to arrange testing such as labs, EKG, etc, we will make arrangements to do so.    Although advances in technology are sophisticated, we cannot ensure that it will always work on either your end or our end.  If the connection with a video visit is poor, we may have to switch to a telephone visit.  With either a video or telephone visit, we are not always able to ensure that we have a secure connection.   I need to obtain your verbal consent now.   Are you willing to proceed with your visit today?   Carla [name] has provided verbal consent on 10/24/2020 for a virtual visit (video or telephone).  Virtual Visit via Video  I connected with  Carla [name]  on 10/24/20 at 9:40 by video and verified that I am speaking with the correct person using two identifiers. Carla [name] is currently located at home and none is currently with her during visit. The provider, Mary-Margaret Hassell Done, FNP is located at home at time of visit.  I discussed the limitations, risks, security and privacy concerns of performing an evaluation and management service by video  and the availability of in person appointments. I also discussed with the patient that there may  be a patient responsible charge related to this service. The patient expressed understanding and agreed to proceed.   Subjective:   HPI:   Cough and congestion that started on Wednesday. Chest feels tight. She has had no fever or body aches. No sob. She has been using mucinex and albuterol inhaler. Home covid test was negative.  Review of Systems  Constitutional: Positive for malaise/fatigue. Negative for chills and fever.  HENT: Positive for congestion and sore throat. Negative for sinus pain.   Respiratory: Positive for cough and sputum production. Negative for shortness of breath.   Musculoskeletal: Negative for myalgias.  Neurological: Negative for headaches.     See pertinent positives and negatives per HPI.  Patient Active Problem List   Diagnosis Date Noted  . Postpartum depression 09/30/2019  . Chronic hypertension in obstetric context, third trimester 08/19/2019  . SVD (spontaneous vaginal delivery) 08/19/2019    Social History   Tobacco Use  . Smoking status: Never Smoker  . Smokeless tobacco: Never Used  Substance Use Topics  . Alcohol use: No    Current Outpatient Medications:  .  albuterol (VENTOLIN HFA) 108 (90 Base) MCG/ACT inhaler, Inhale 2 puffs into the lungs every 6 (six) hours as needed for wheezing or shortness of breath., Disp: 8 g, Rfl: 0 .  Blood Pressure Monitoring (BLOOD PRESSURE KIT) DEVI, 1 kit by Does not apply route  once a week. CHECK BP WEEKLY.  LARGE CUFF. DX O09.0, Disp: 1 kit, Rfl: 0 .  calcium carbonate (CALCIUM 600) 600 MG TABS tablet, Take 600 mg by mouth 2 (two) times daily with a meal., Disp: , Rfl:  .  cetirizine (ZYRTEC ALLERGY) 10 MG tablet, Take 1 tablet (10 mg total) by mouth daily., Disp: 90 tablet, Rfl: 0 .  cyclobenzaprine (FLEXERIL) 5 MG tablet, Take 1 tablet (5 mg total) by mouth 3 (three) times daily as needed for muscle spasms., Disp: 30 tablet, Rfl: 1 .  ibuprofen (ADVIL) 800 MG tablet, Take 1 tablet (800 mg total) by mouth  every 8 (eight) hours as needed., Disp: 30 tablet, Rfl: 0 .  pantoprazole (PROTONIX) 40 MG tablet, Take 1 tablet (40 mg total) by mouth daily., Disp: 30 tablet, Rfl: 0 .  Prenatal Vit-Fe Fumarate-FA (PRENATAL MULTIVITAMIN) TABS tablet, Take 1 tablet by mouth daily at 12 noon., Disp: , Rfl:  .  sucralfate (CARAFATE) 1 GM/10ML suspension, Take 10 mLs (1 g total) by mouth 4 (four) times daily -  with meals and at bedtime., Disp: 420 mL, Rfl: 0  Allergies  Allergen Reactions  . Shellfish Allergy Anaphylaxis  . Latex Itching    Objective:   There were no vitals taken for this visit.  Patient is well-developed, well-nourished in no acute distress.  Resting comfortably  at home.  Head is normocephalic, atraumatic.  No labored breathing.  Speech is clear and coherent with logical content.  Patient is alert and oriented at baseline.  Dry cough noted  Assessment and Plan:        Carla [name] in today with chief complaint of No chief complaint on file.   1. URI with cough and congestion 1. Take meds as prescribed 2. Use a cool mist humidifier especially during the winter months and when heat has been humid. 3. Use saline nose sprays frequently 4. Saline irrigations of the nose can be very helpful if done frequently.  * 4X daily for 1 week*  * Use of a nettie pot can be helpful with this. Follow directions with this* 5. Drink plenty of fluids 6. Keep thermostat turn down low 7.For any cough or congestion  Use plain Mucinex- regular strength or max strength is fine   * Children- consult with Pharmacist for dosing 8. For fever or aces or pains- take tylenol or ibuprofen appropriate for age and weight.  * for fevers greater than 101 orally you may alternate ibuprofen and tylenol every  3 hours.   Meds ordered this encounter  Medications  . benzonatate (TESSALON PERLES) 100 MG capsule    Sig: Take 1 capsule (100 mg total) by mouth 3 (three) times daily as needed for cough.     Dispense:  20 capsule    Refill:  0    Order Specific Question:   Supervising Provider    Answer:   MILLER, BRIAN [[id number]]  . predniSONE (DELTASONE) 20 MG tablet    Sig: Take 2 tablets (40 mg total) by mouth daily with breakfast for 5 days. 2 po daily for 5 days    Dispense:  10 tablet    Refill:  0    Order Specific Question:   Supervising Provider    Answer:   Noemi Chapel [[id number]]       The above assessment and management plan was discussed with the patient. The patient verbalized understanding of and has agreed to the management plan. Patient is aware to call  the clinic if symptoms persist or worsen. Patient is aware when to return to the clinic for a follow-up visit. Patient educated on when it is appropriate to go to the emergency department.   [place], FNP  .   Aubrey, Anderson 10/24/2020  Time spent with the patient: 10. minutes, of which >50% was spent in obtaining information about symptoms, reviewing previous labs, evaluations, and treatments, counseling about condition (please see the discussed topics above), and developing a plan to further investigate it; had a number of questions which I addressed.

## 2020-10-24 NOTE — Patient Instructions (Signed)

## 2020-11-22 IMAGING — US US MFM OB FOLLOW-UP
1 series · 14 of 28 positions shown · non-contrast
Comparison: none

[Series 1: us mfm ob follow-up · 36 acquisitions, 14 frames shown]
[im 2/36]
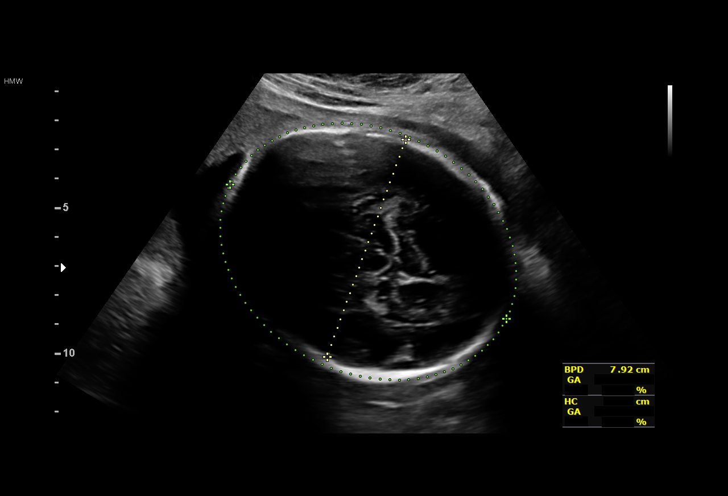
[im 4/36]
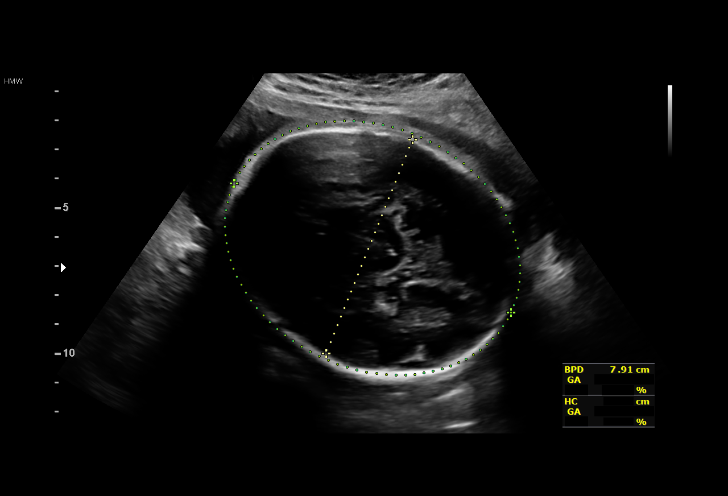
[im 7/36]
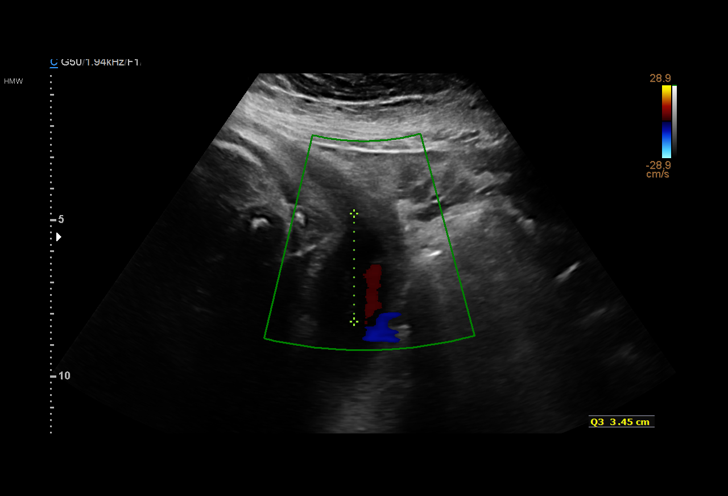
[im 10/36]
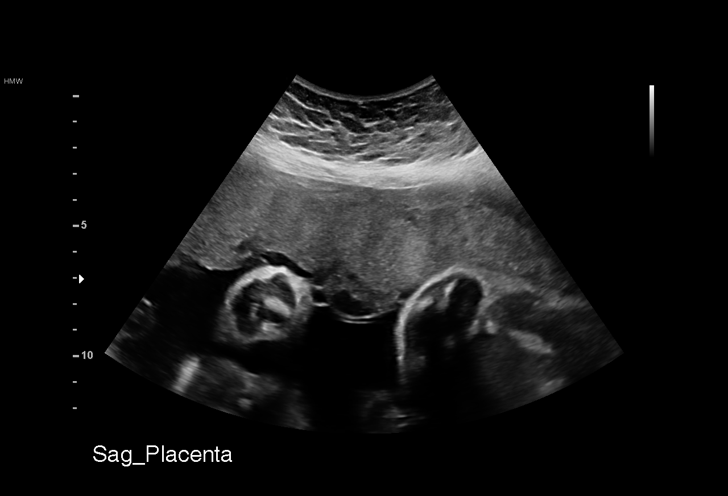
[im 12/36]
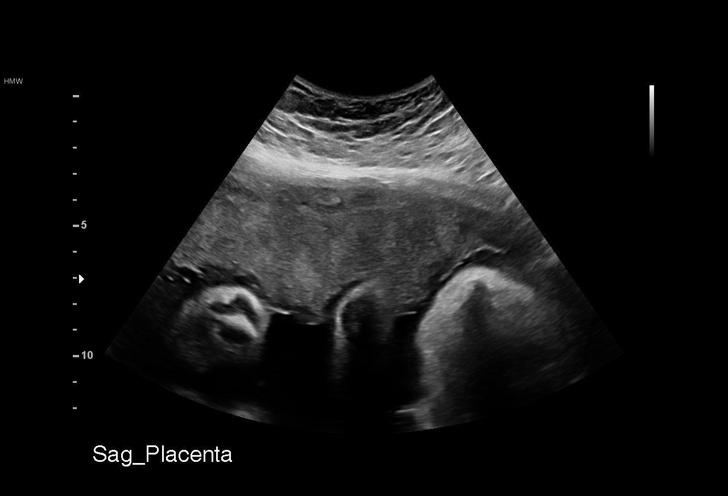
[im 15/36]
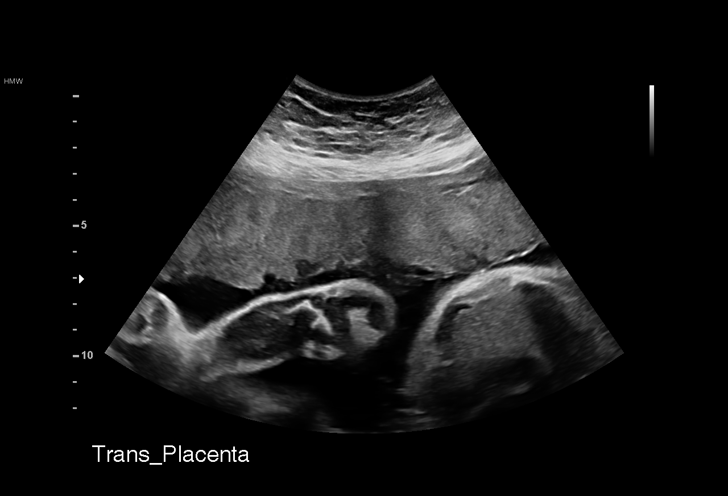
[im 17/36]
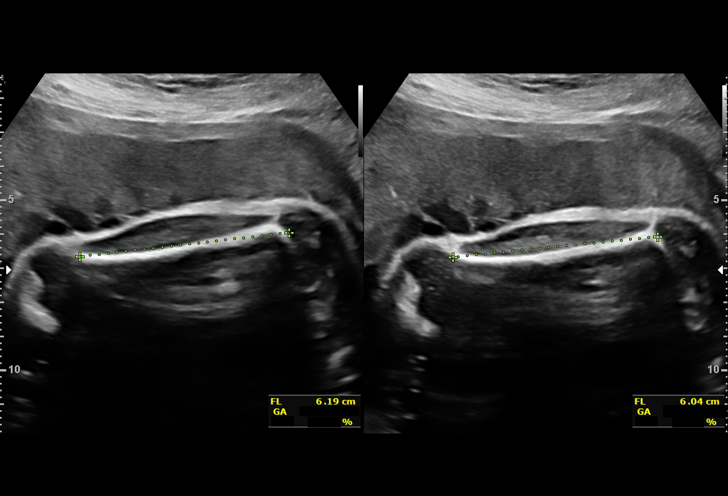
[im 20/36]
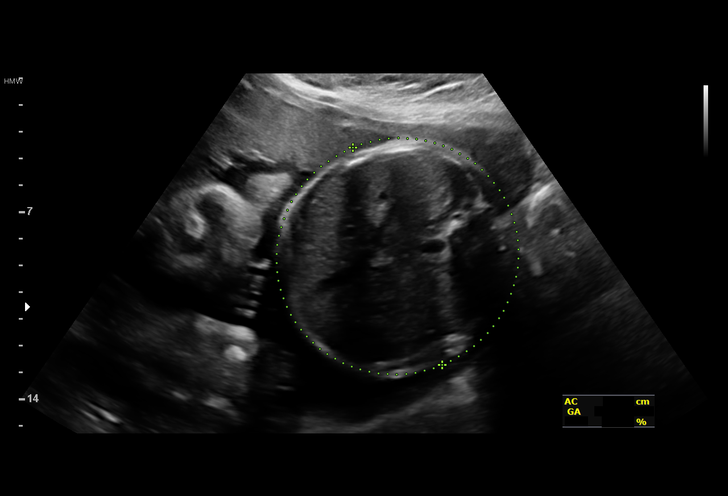
[im 23/36]
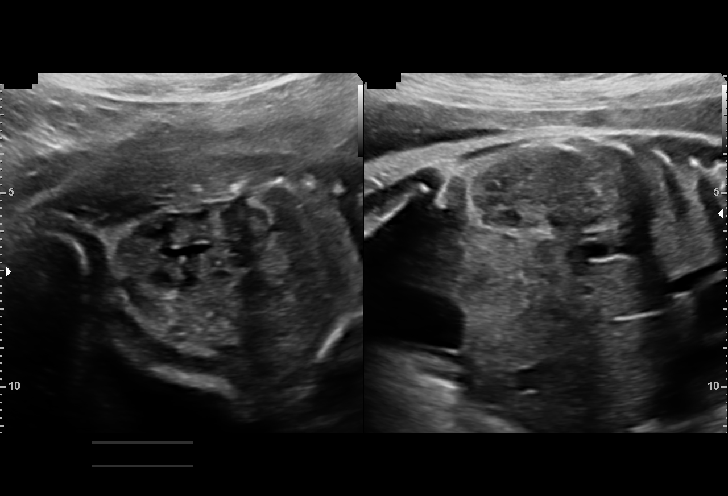
[im 25/36]
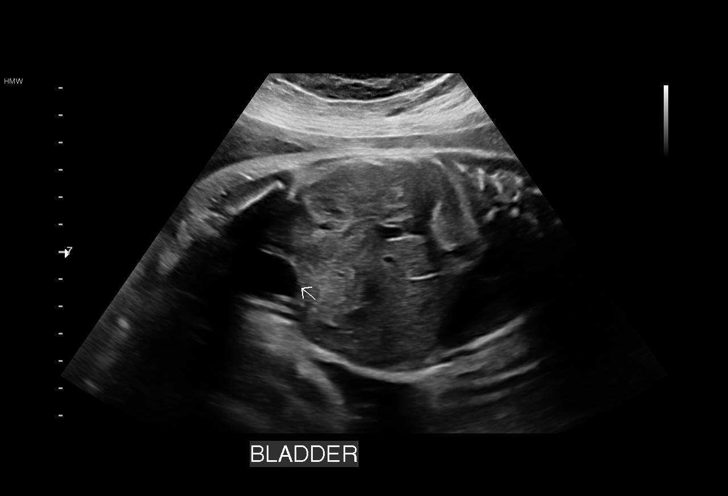
[im 28/36]
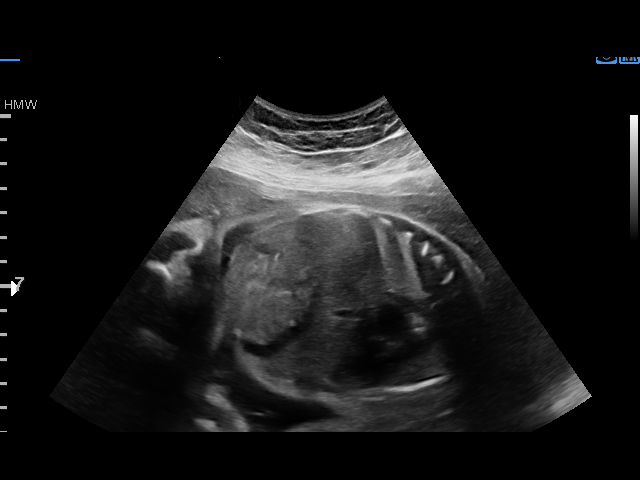
[im 30/36]
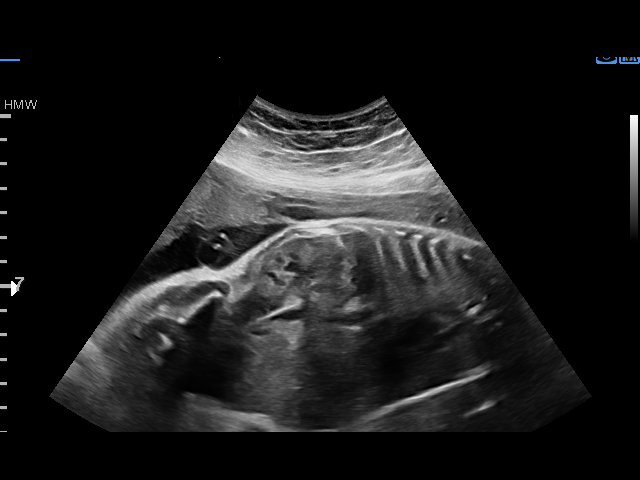
[im 33/36]
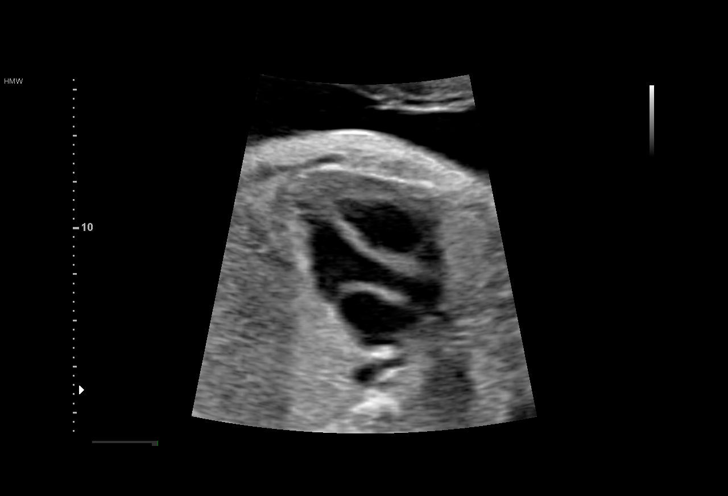
[im 36/36]
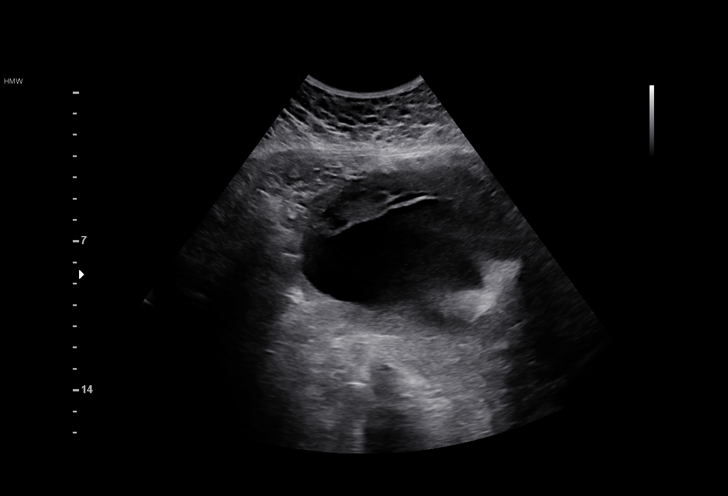

[14 of 28 positions shown; findings below may reference images not displayed]

----------------------------------------------------------------------

 ----------------------------------------------------------------------
Indications

  Hypertension - Chronic/Pre-existing (No
  Meds)
  32 weeks gestation of pregnancy
  Encounter for other antenatal screening
  follow-up
  Hyperemesis gravidarum
 ----------------------------------------------------------------------
Vital Signs

                                                Height:        5'4"
Fetal Evaluation

 Num Of Fetuses:         1
 Fetal Heart Rate(bpm):  133
 Cardiac Activity:       Observed
 Presentation:           Cephalic
 Placenta:               Anterior
 P. Cord Insertion:      Previously Visualized

 Amniotic Fluid
 AFI FV:      Within normal limits

 AFI Sum(cm)     %Tile       Largest Pocket(cm)
 16.73           61

 RUQ(cm)       RLQ(cm)       LUQ(cm)        LLQ(cm)

Biometry
 BPD:        79  mm     G. Age:  31w 5d         28  %    CI:        71.83   %    70 - 86
                                                         FL/HC:      20.5   %    19.1 -
 HC:      296.7  mm     G. Age:  32w 6d         30  %    HC/AC:      1.03        0.96 -
 AC:      287.8  mm     G. Age:  32w 6d         68  %    FL/BPD:     77.0   %    71 - 87
 FL:       60.8  mm     G. Age:  31w 4d         23  %    FL/AC:      21.1   %    20 - 24

 Est. FW:    5393  gm      4 lb 5 oz     46  %
OB History

 Gravidity:    8         Term:   2        Prem:   0        SAB:   5
 TOP:          0       Ectopic:  0        Living: 2
Gestational Age

 LMP:           32w 1d        Date:  11/26/18                 EDD:   09/02/19
 U/S Today:     32w 2d                                        EDD:   09/01/19
 Best:          32w 1d     Det. By:  LMP  (11/26/18)          EDD:   09/02/19
Anatomy

 Cranium:               Appears normal         LVOT:                   Previously seen
 Cavum:                 Previously seen        Aortic Arch:            Previously seen
 Ventricles:            Appears normal         Ductal Arch:            Previously seen
 Choroid Plexus:        Previously seen        Diaphragm:              Previously seen
 Cerebellum:            Previously seen        Stomach:                Appears normal, left
                                                                       sided
 Posterior Fossa:       Previously seen        Abdomen:                Previously seen
 Nuchal Fold:           Previously seen        Abdominal Wall:         Previously seen
 Face:                  Orbits and profile     Cord Vessels:           Previously seen
                        previously seen
 Lips:                  Previously seen        Kidneys:                Appear normal
 Palate:                Previously seen        Bladder:                Appears normal
 Thoracic:              Previously seen        Spine:                  Previously seen
 Heart:                 Appears normal         Upper Extremities:      Previously seen
                        (4CH, axis, and
                        situs)
 RVOT:                  Previously seen        Lower Extremities:      Previously seen

 Other:  Technically difficult due to fetal position.
Cervix Uterus Adnexa

 Cervix
 Not visualized (advanced GA >48wks)

 Uterus
 No abnormality visualized.

 Left Ovary
 No adnexal mass visualized.

 Right Ovary
 No adnexal mass visualized.

 Cul De Sac
 No free fluid seen.
 Adnexa
 No abnormality visualized.
Comments

 This patient was seen for a follow up growth scan due to a
 history of chronic hypertension that is not currently treated
 with any medications.  She denies any problems since her
 last exam.
 She was informed that the fetal growth and amniotic fluid
 level appears appropriate for her gestational age.
 A follow up exam was scheduled in 4 weeks.

## 2020-12-07 ENCOUNTER — Other Ambulatory Visit: Payer: Self-pay

## 2020-12-07 ENCOUNTER — Encounter (HOSPITAL_BASED_OUTPATIENT_CLINIC_OR_DEPARTMENT_OTHER): Payer: Self-pay | Admitting: Emergency Medicine

## 2020-12-07 ENCOUNTER — Ambulatory Visit (HOSPITAL_COMMUNITY): Payer: Self-pay

## 2020-12-07 ENCOUNTER — Emergency Department (HOSPITAL_BASED_OUTPATIENT_CLINIC_OR_DEPARTMENT_OTHER)
Admission: EM | Admit: 2020-12-07 | Discharge: 2020-12-07 | Disposition: A | Discharge status: Home / Self Care | Payer: 59 | Attending: Emergency Medicine | Admitting: Emergency Medicine

## 2020-12-07 DIAGNOSIS — Z91040 Latex allergy status: Secondary | ICD-10-CM | POA: Insufficient documentation

## 2020-12-07 DIAGNOSIS — L03011 Cellulitis of right finger: Secondary | ICD-10-CM | POA: Insufficient documentation

## 2020-12-07 DIAGNOSIS — M7989 Other specified soft tissue disorders: Secondary | ICD-10-CM | POA: Diagnosis present

## 2020-12-07 MED ORDER — PENTAFLUOROPROP-TETRAFLUOROETH EX AERO
INHALATION_SPRAY | Freq: Once | CUTANEOUS | Status: AC
  Administered 2020-12-07: 30 via TOPICAL
  Filled 2020-12-07: qty 30

## 2020-12-07 MED ORDER — CEPHALEXIN 500 MG PO CAPS: 500.0000 mg | ORAL_CAPSULE | Freq: Four times a day (QID) | ORAL | 0 refills | Status: DC

## 2020-12-07 MED ORDER — CEPHALEXIN 250 MG PO CAPS
500.0000 mg | ORAL_CAPSULE | Freq: Once | ORAL | Status: AC
  Administered 2020-12-07: 500 mg via ORAL
  Filled 2020-12-07: qty 2

## 2020-12-07 NOTE — Discharge Instructions (Addendum)
Begin taking Keflex as prescribed.  Apply warm compresses as frequently as possible for the next several days.  Follow-up with primary doctor if not improving and return to the ER if symptoms worsen or change.

## 2020-12-07 NOTE — ED Triage Notes (Signed)
Reports infection to right ring finger since yesterday.

## 2020-12-07 NOTE — ED Notes (Signed)
EDP at bedside  

## 2020-12-07 NOTE — ED Provider Notes (Addendum)
[facility] Provider Note   CSN: [id number] Arrival date & time: 12/07/20  0007     History No chief complaint on file.   Carla [name] is a 30 y.o. female.  Patient is a 30 year old female with no significant past medical history.  She presents today with complaints of pain and swelling to the right ring finger.  This started yesterday.  She was able to express a small amount of pus from it this evening, but it continues to be swollen and red.    The history is provided by the patient.      Past Medical History:  Diagnosis Date   Allergy    Anemia    Depression    postpartum   Headache(784.0)    Hyperemesis arising during pregnancy    PONV (postoperative nausea and vomiting)    Postpartum depression    Pregnancy induced hypertension    Spontaneous abortion 12/03/2010    Patient Active Problem List   Diagnosis Date Noted   Postpartum depression 09/30/2019   Chronic hypertension in obstetric context, third trimester 08/19/2019   SVD (spontaneous vaginal delivery) 08/19/2019    Past Surgical History:  Procedure Laterality Date   CHOLECYSTECTOMY N/A 05/13/2015   Procedure: LAPAROSCOPIC CHOLECYSTECTOMY ;  Surgeon: Donnie Mesa, MD;  Location: [facility];  Service: General;  Laterality: N/A;   DILATION AND EVACUATION  12/03/2010   Procedure: DILATATION AND EVACUATION (D&E);  Surgeon: Agnes Lawrence, MD;  Location: [facility];  Service: Gynecology;  Laterality: N/A;   OVARIAN CYST REMOVAL     TONSILLECTOMY     TUBAL LIGATION N/A 08/20/2019   Procedure: POST PARTUM TUBAL LIGATION;  Surgeon: Truett Mainland, DO;  Location: [facility];  Service: Gynecology;  Laterality: N/A;     OB History     Gravida  8   Para  3   Term  3   Preterm      AB  5   Living  3      SAB  5   IAB      Ectopic      Multiple  0   Live Births  3        Obstetric Comments  Patient has had multiple miscarriages- she has had 2 recent SAB. The  most recent is reported 1 1/2 months ago. Pt states she may have had up to 8 pregnancies, has had multiple SAB- lost count.         Family History  Problem Relation Age of Onset   Arthritis Mother    Cancer Mother    Diabetes Mother    Hearing loss Mother    Hypertension Mother    Obesity Mother    Stroke Mother    Hypertension Father    Stroke Father    Hypertension Other    Diabetes Other    Stroke Other    Cancer Other    Heart failure Other    Seizures Other    ADD / ADHD Brother    Asthma Brother    Learning disabilities Brother    Alcohol abuse Paternal Aunt    Alcohol abuse Paternal Uncle    COPD Maternal Grandmother    Cancer Maternal Grandfather     Social History   Tobacco Use   Smoking status: Never   Smokeless tobacco: Never  Vaping Use   Vaping Use: Never used  Substance Use Topics   Alcohol use: No   Drug use:  No    Home Medications Prior to Admission medications   Medication Sig Start Date End Date Taking? Authorizing Provider  albuterol (VENTOLIN HFA) 108 (90 Base) MCG/ACT inhaler Inhale 2 puffs into the lungs every 6 (six) hours as needed for wheezing or shortness of breath. 06/04/20   Marrian Salvage, FNP  benzonatate (TESSALON PERLES) 100 MG capsule Take 1 capsule (100 mg total) by mouth 3 (three) times daily as needed for cough. 10/24/20   Hassell Done, Mary-Margaret, FNP  Blood Pressure Monitoring (BLOOD PRESSURE KIT) DEVI 1 kit by Does not apply route once a week. CHECK BP WEEKLY.  LARGE CUFF. DX O09.0 02/08/19   Fair, Marin Shutter, MD  calcium carbonate (CALCIUM 600) 600 MG TABS tablet Take 600 mg by mouth 2 (two) times daily with a meal.    [provider]  cetirizine (ZYRTEC ALLERGY) 10 MG tablet Take 1 tablet (10 mg total) by mouth daily. 12/07/19   Jaynee Eagles, PA-C  cyclobenzaprine (FLEXERIL) 5 MG tablet Take 1 tablet (5 mg total) by mouth 3 (three) times daily as needed for muscle spasms. 10/21/20   Mar Daring, PA-C   ibuprofen (ADVIL) 800 MG tablet Take 1 tablet (800 mg total) by mouth every 8 (eight) hours as needed. 10/21/20   Mar Daring, PA-C  pantoprazole (PROTONIX) 40 MG tablet Take 1 tablet (40 mg total) by mouth daily. 09/09/20   Brunetta Jeans, PA-C  Prenatal Vit-Fe Fumarate-FA (PRENATAL MULTIVITAMIN) TABS tablet Take 1 tablet by mouth daily at 12 noon.    [provider]  sucralfate (CARAFATE) 1 GM/10ML suspension Take 10 mLs (1 g total) by mouth 4 (four) times daily -  with meals and at bedtime. 09/09/20   Brunetta Jeans, PA-C    Allergies    Shellfish allergy and Latex  Review of Systems   Review of Systems  All other systems reviewed and are negative.  Physical Exam Updated Vital Signs BP (!) 156/110 (BP Location: Left Arm)   Pulse 79   Temp 98.6 F (37 C) (Oral)   Resp 16   Ht _0  (1.626 m)   Wt 89.2 kg   LMP 12/07/2020   SpO2 100%   BMI 33.76 kg/m   Physical Exam Vitals and nursing note reviewed.  Constitutional:      General: She is not in acute distress.    Appearance: Normal appearance. She is not ill-appearing.  Pulmonary:     Effort: Pulmonary effort is normal.  Skin:    General: Skin is warm and dry.     Comments: There is a small paronychia to the side and base of the nail of the right ring finger.  There is erythema overlying this.  Neurological:     Mental Status: She is alert.    ED Results / Procedures / Treatments   Labs (all labs ordered are listed, but only abnormal results are displayed) Labs Reviewed - No data to display  EKG None  Radiology No results found.  Procedures Procedures   Medications Ordered in ED Medications  pentafluoroprop-tetrafluoroeth (GEBAUERS) aerosol (30 application Topical Given 12/07/20 0028)    ED Course  I have reviewed the triage vital signs and the nursing notes.  Pertinent labs & imaging results that were available during my care of the patient were reviewed by me and considered in my  medical decision making (see chart for details).    MDM Rules/Calculators/A&P  Paronychia incised and drained as below.  Patient to treat  with local wound care, Keflex, warm soaks, and follow-up as needed.  INCISION AND DRAINAGE Performed by: Veryl Speak Consent: Verbal consent obtained. Risks and benefits: risks, benefits and alternatives were discussed Type: abscess  Body area: Right ring finger  Anesthesia: none  Incision was made with a scalpel.  Local anesthetic: pain ease spray  Anesthetic total: 6 second spray    Drainage: bloody  Drainage amount: mild  Packing material: none  Patient tolerance: Patient tolerated the procedure well with no immediate complications.    Final Clinical Impression(s) / ED Diagnoses Final diagnoses:  None    Rx / DC Orders ED Discharge Orders     None        Veryl Speak, MD 12/07/20 Corey Skains    Veryl Speak, MD 12/07/20 [phone]

## 2020-12-24 ENCOUNTER — Telehealth: Payer: 59 | Admitting: Physician Assistant

## 2020-12-24 DIAGNOSIS — N760 Acute vaginitis: Secondary | ICD-10-CM

## 2020-12-24 MED ORDER — FLUCONAZOLE 150 MG PO TABS: 150.0000 mg | ORAL_TABLET | Freq: Once | ORAL | 0 refills | Status: AC

## 2020-12-24 MED ORDER — NYSTATIN 100000 UNIT/GM EX CREA: 1.0000 "application " | TOPICAL_CREAM | Freq: Two times a day (BID) | CUTANEOUS | 0 refills | Status: AC

## 2020-12-24 NOTE — Progress Notes (Signed)

## 2021-05-20 ENCOUNTER — Telehealth: Payer: 59 | Admitting: Physician Assistant

## 2021-05-20 DIAGNOSIS — G43819 Other migraine, intractable, without status migrainosus: Secondary | ICD-10-CM | POA: Diagnosis not present

## 2021-05-20 MED ORDER — CYCLOBENZAPRINE HCL 10 MG PO TABS: 10.0000 mg | ORAL_TABLET | Freq: Three times a day (TID) | ORAL | 0 refills | Status: DC | PRN

## 2021-05-20 MED ORDER — ONDANSETRON HCL 4 MG PO TABS: 4.0000 mg | ORAL_TABLET | Freq: Three times a day (TID) | ORAL | 0 refills | Status: DC | PRN

## 2021-05-20 MED ORDER — UBRELVY 50 MG PO TABS: 50.0000 mg | ORAL_TABLET | ORAL | 0 refills | Status: DC | PRN

## 2021-05-20 NOTE — Progress Notes (Signed)
[name], nevitt are scheduled for a virtual visit with your provider today.    Just as we do with appointments in the office, we must obtain your consent to participate.  Your consent will be active for this visit and any virtual visit you may have with one of our providers in the next 365 days.    If you have a MyChart account, I can also send a copy of this consent to you electronically.  All virtual visits are billed to your insurance company just like a traditional visit in the office.  As this is a virtual visit, video technology does not allow for your provider to perform a traditional examination.  This may limit your provider's ability to fully assess your condition.  If your provider identifies any concerns that need to be evaluated in person or the need to arrange testing such as labs, EKG, etc, we will make arrangements to do so.    Although advances in technology are sophisticated, we cannot ensure that it will always work on either your end or our end.  If the connection with a video visit is poor, we may have to switch to a telephone visit.  With either a video or telephone visit, we are not always able to ensure that we have a secure connection.   I need to obtain your verbal consent now.   Are you willing to proceed with your visit today?   SAHARA FUJIMOTO has provided verbal consent on 05/20/2021 for a virtual visit (video or telephone).   Abigail Butts, PA-C 05/20/2021  3:46 PM   Date:  05/20/2021   ID:  [name], DOB 1991-03-27, MRN [id number]  Patient Location: Home Provider Location: Home Office   Participants: Patient and Provider for Visit and Wrap up  Method of visit: Video  Location of Patient: Home Location of Provider: Home Office Consent was obtain for visit over the video. Services rendered by provider: Visit was performed via video  A video enabled telemedicine application was used and I verified that I am speaking with the correct person using  two identifiers.  PCP:  Lin Landsman, MD   Chief Complaint:  headache  History of Present Illness:    Carla [name] is a 30 y.o. female with history as stated below. Presents video telehealth for an acute care visit.  Pt reports a hx of migraine headaches with associated muscle spasms in her neck and shoulder.  Pt reports these symptoms reoccurred yesterday.  Pt reports central headache and left sided neck pain. She has associated nausea without vomiting.  Pt also with photophobia.  Pt reports normally taking ibuprofen.  Pt reports today's headache is not improving even with rest.  She has taken ibuprofen today without relief. Denies vision changes, numbness, weakness or other concerns.  No other aggravating or relieving factors.  No other c/o.  Past Medical, Surgical, Social History, Allergies, and Medications have been Reviewed.  Patient Active Problem List   Diagnosis Date Noted   Postpartum depression 09/30/2019   Chronic hypertension in obstetric context, third trimester 08/19/2019   SVD (spontaneous vaginal delivery) 08/19/2019    Social History   Tobacco Use   Smoking status: Never   Smokeless tobacco: Never  Substance Use Topics   Alcohol use: No     Current Outpatient Medications:    cyclobenzaprine (FLEXERIL) 10 MG tablet, Take 1 tablet (10 mg total) by mouth 3 (three) times daily as needed for muscle spasms., Disp: 30 tablet, Rfl:  0   ondansetron (ZOFRAN) 4 MG tablet, Take 1 tablet (4 mg total) by mouth every 8 (eight) hours as needed for nausea or vomiting., Disp: 10 tablet, Rfl: 0   Ubrogepant (UBRELVY) 50 MG TABS, Take 50 mg by mouth every 2 (two) hours as needed., Disp: 10 tablet, Rfl: 0   albuterol (VENTOLIN HFA) 108 (90 Base) MCG/ACT inhaler, Inhale 2 puffs into the lungs every 6 (six) hours as needed for wheezing or shortness of breath., Disp: 8 g, Rfl: 0   benzonatate (TESSALON PERLES) 100 MG capsule, Take 1 capsule (100 mg total) by mouth 3 (three) times  daily as needed for cough., Disp: 20 capsule, Rfl: 0   Blood Pressure Monitoring (BLOOD PRESSURE KIT) DEVI, 1 kit by Does not apply route once a week. CHECK BP WEEKLY.  LARGE CUFF. DX O09.0, Disp: 1 kit, Rfl: 0   calcium carbonate (CALCIUM 600) 600 MG TABS tablet, Take 600 mg by mouth 2 (two) times daily with a meal., Disp: , Rfl:    cephALEXin (KEFLEX) 500 MG capsule, Take 1 capsule (500 mg total) by mouth 4 (four) times daily., Disp: 20 capsule, Rfl: 0   cetirizine (ZYRTEC ALLERGY) 10 MG tablet, Take 1 tablet (10 mg total) by mouth daily., Disp: 90 tablet, Rfl: 0   ibuprofen (ADVIL) 800 MG tablet, Take 1 tablet (800 mg total) by mouth every 8 (eight) hours as needed., Disp: 30 tablet, Rfl: 0   pantoprazole (PROTONIX) 40 MG tablet, Take 1 tablet (40 mg total) by mouth daily., Disp: 30 tablet, Rfl: 0   Prenatal Vit-Fe Fumarate-FA (PRENATAL MULTIVITAMIN) TABS tablet, Take 1 tablet by mouth daily at 12 noon., Disp: , Rfl:    sucralfate (CARAFATE) 1 GM/10ML suspension, Take 10 mLs (1 g total) by mouth 4 (four) times daily -  with meals and at bedtime., Disp: 420 mL, Rfl: 0   Allergies  Allergen Reactions   Shellfish Allergy Anaphylaxis   Latex Itching     Review of Systems  Constitutional:  Negative for chills and fever.  HENT:  Negative for congestion, ear pain and sore throat.   Eyes:  Positive for photophobia. Negative for blurred vision and double vision.  Respiratory:  Negative for cough, shortness of breath and wheezing.   Cardiovascular:  Negative for chest pain, palpitations and leg swelling.  Gastrointestinal:  Negative for abdominal pain, diarrhea, nausea and vomiting.  Genitourinary:  Negative for dysuria.  Musculoskeletal:  Positive for neck pain. Negative for myalgias.  Skin:  Negative for rash.  Neurological:  Positive for headaches. Negative for loss of consciousness and weakness.  Psychiatric/Behavioral:  The patient is not nervous/anxious.   See HPI for history of present  illness.  Physical Exam Constitutional:      General: She is not in acute distress.    Appearance: Normal appearance. She is not ill-appearing.  HENT:     Head: Normocephalic and atraumatic.     Nose: No congestion.  Eyes:     Extraocular Movements: Extraocular movements intact.  Neck:     Comments: Full ROM of the neck Pulmonary:     Effort: Pulmonary effort is normal.  Musculoskeletal:        General: Normal range of motion.     Cervical back: Normal range of motion.  Skin:    Coloration: Skin is not pale.  Neurological:     General: No focal deficit present.     Mental Status: She is alert. Mental status is at baseline.  Comments: No facial droop Speech clear and goal oriented  Psychiatric:        Mood and Affect: Mood normal.              A&P  1. Other migraine without status migrainosus, intractable  - zofran for nausea  - flexeril for muscle spasms  - Ubrelvy refill for headache  - recommend f/u with neurology for further evaluation and treatment  - in person visit for persistent symptoms   Patient voiced understanding and agreement to plan.   Time:   Today, I have spent 15 minutes with the patient with telehealth technology discussing the above problems, reviewing the chart, previous notes, medications and orders.    Tests Ordered: No orders of the defined types were placed in this encounter.   Medication Changes: Meds ordered this encounter  Medications   ondansetron (ZOFRAN) 4 MG tablet    Sig: Take 1 tablet (4 mg total) by mouth every 8 (eight) hours as needed for nausea or vomiting.    Dispense:  10 tablet    Refill:  0   cyclobenzaprine (FLEXERIL) 10 MG tablet    Sig: Take 1 tablet (10 mg total) by mouth 3 (three) times daily as needed for muscle spasms.    Dispense:  30 tablet    Refill:  0   Ubrogepant (UBRELVY) 50 MG TABS    Sig: Take 50 mg by mouth every 2 (two) hours as needed.    Dispense:  10 tablet    Refill:  0      Disposition:  Follow up pcp and neurology  Signed, Abigail Butts, PA-C  05/20/2021 3:56 PM

## 2021-05-20 NOTE — Patient Instructions (Signed)
1. Other migraine without status migrainosus, intractable  - zofran for nausea  - flexeril for muscle spasms  - Ubrelvy refill for headache  - recommend f/u with neurology for further evaluation and treatment  - in person visit for persistent symptoms

## 2021-09-26 ENCOUNTER — Telehealth: Payer: 59 | Admitting: Nurse Practitioner

## 2021-09-26 DIAGNOSIS — J329 Chronic sinusitis, unspecified: Secondary | ICD-10-CM | POA: Diagnosis not present

## 2021-09-26 DIAGNOSIS — B9689 Other specified bacterial agents as the cause of diseases classified elsewhere: Secondary | ICD-10-CM | POA: Diagnosis not present

## 2021-09-26 MED ORDER — AMOXICILLIN-POT CLAVULANATE 875-125 MG PO TABS: 1.0000 | ORAL_TABLET | Freq: Two times a day (BID) | ORAL | 0 refills | Status: AC

## 2021-09-26 MED ORDER — AMOXICILLIN-POT CLAVULANATE 875-125 MG PO TABS: 1.0000 | ORAL_TABLET | Freq: Two times a day (BID) | ORAL | 0 refills | Status: DC

## 2021-09-26 NOTE — Patient Instructions (Signed)
?[name], thank you for joining Gildardo Pounds, NP for today's virtual visit.  While this provider is not your primary care provider (PCP), if your PCP is located in our provider database this encounter information will be shared with them immediately following your visit. ? ?Consent: ?(Patient) Carla [name] provided verbal consent for this virtual visit at the beginning of the encounter. ? ?Current Medications: ? ?Current Outpatient Medications:  ?  albuterol (VENTOLIN HFA) 108 (90 Base) MCG/ACT inhaler, Inhale 2 puffs into the lungs every 6 (six) hours as needed for wheezing or shortness of breath., Disp: 8 g, Rfl: 0 ?  amoxicillin-clavulanate (AUGMENTIN) 875-125 MG tablet, Take 1 tablet by mouth 2 (two) times daily for 7 days., Disp: 14 tablet, Rfl: 0 ?  benzonatate (TESSALON PERLES) 100 MG capsule, Take 1 capsule (100 mg total) by mouth 3 (three) times daily as needed for cough., Disp: 20 capsule, Rfl: 0 ?  Blood Pressure Monitoring (BLOOD PRESSURE KIT) DEVI, 1 kit by Does not apply route once a week. CHECK BP WEEKLY.  LARGE CUFF. DX O09.0, Disp: 1 kit, Rfl: 0 ?  calcium carbonate (CALCIUM 600) 600 MG TABS tablet, Take 600 mg by mouth 2 (two) times daily with a meal., Disp: , Rfl:  ?  cetirizine (ZYRTEC ALLERGY) 10 MG tablet, Take 1 tablet (10 mg total) by mouth daily., Disp: 90 tablet, Rfl: 0 ?  cyclobenzaprine (FLEXERIL) 10 MG tablet, Take 1 tablet (10 mg total) by mouth 3 (three) times daily as needed for muscle spasms., Disp: 30 tablet, Rfl: 0 ?  ibuprofen (ADVIL) 800 MG tablet, Take 1 tablet (800 mg total) by mouth every 8 (eight) hours as needed., Disp: 30 tablet, Rfl: 0 ?  ondansetron (ZOFRAN) 4 MG tablet, Take 1 tablet (4 mg total) by mouth every 8 (eight) hours as needed for nausea or vomiting., Disp: 10 tablet, Rfl: 0 ?  pantoprazole (PROTONIX) 40 MG tablet, Take 1 tablet (40 mg total) by mouth daily., Disp: 30 tablet, Rfl: 0 ?  Prenatal Vit-Fe Fumarate-FA (PRENATAL MULTIVITAMIN) TABS  tablet, Take 1 tablet by mouth daily at 12 noon., Disp: , Rfl:  ?  sucralfate (CARAFATE) 1 GM/10ML suspension, Take 10 mLs (1 g total) by mouth 4 (four) times daily -  with meals and at bedtime., Disp: 420 mL, Rfl: 0 ?  Ubrogepant (UBRELVY) 50 MG TABS, Take 50 mg by mouth every 2 (two) hours as needed., Disp: 10 tablet, Rfl: 0  ? ?Medications ordered in this encounter:  ?Meds ordered this encounter  ?Medications  ? DISCONTD: amoxicillin-clavulanate (AUGMENTIN) 875-125 MG tablet  ?  Sig: Take 1 tablet by mouth 2 (two) times daily for 5 days.  ?  Dispense:  10 tablet  ?  Refill:  0  ?  Order Specific Question:   Supervising Provider  ?  Answer:   Noemi Chapel [[id number]]  ? amoxicillin-clavulanate (AUGMENTIN) 875-125 MG tablet  ?  Sig: Take 1 tablet by mouth 2 (two) times daily for 7 days.  ?  Dispense:  14 tablet  ?  Refill:  0  ?  Order Specific Question:   Supervising Provider  ?  Answer:   Noemi Chapel [[id number]]  ?  ? ?*If you need refills on other medications prior to your next appointment, please contact your pharmacy* ? ?Follow-Up: ?Call back or seek an in-person evaluation if the symptoms worsen or if the condition fails to improve as anticipated. ? ?Other Instructions ?INSTRUCTIONS: use a humidifier for nasal congestion ?  Drink plenty of fluids, rest and wash hands frequently to avoid the spread of infection ?Alternate tylenol and Motrin for relief of fever  ? ? ?If you have been instructed to have an in-person evaluation today at a local Urgent Care facility, please use the link below. It will take you to a list of all of our available [facility], including address, phone number and hours of operation. Please do not delay care.  ?[facility] ? ?If you or a family member do not have a primary care provider, use the link below to schedule a visit and establish care. When you choose a [facility] primary care physician or advanced practice provider, you gain a long-term partner in  health. ?Find a Primary Care Provider ? ?Learn more about 's in-office and virtual care options: ?[facility]  ?

## 2021-09-26 NOTE — Progress Notes (Signed)
?Virtual Visit Consent  ? ?Carla [name], you are scheduled for a virtual visit with a [facility] provider today. Just as with appointments in the office, your consent must be obtained to participate. Your consent will be active for this visit and any virtual visit you may have with one of our providers in the next 365 days. If you have a MyChart account, a copy of this consent can be sent to you electronically. ? ?As this is a virtual visit, video technology does not allow for your provider to perform a traditional examination. This may limit your provider's ability to fully assess your condition. If your provider identifies any concerns that need to be evaluated in person or the need to arrange testing (such as labs, EKG, etc.), we will make arrangements to do so. Although advances in technology are sophisticated, we cannot ensure that it will always work on either your end or our end. If the connection with a video visit is poor, the visit may have to be switched to a telephone visit. With either a video or telephone visit, we are not always able to ensure that we have a secure connection. ? ?By engaging in this virtual visit, you consent to the provision of healthcare and authorize for your insurance to be billed (if applicable) for the services provided during this visit. Depending on your insurance coverage, you may receive a charge related to this service. ? ?I need to obtain your verbal consent now. Are you willing to proceed with your visit today? Carla [name] has provided verbal consent on 09/26/2021 for a virtual visit (video or telephone). Gildardo Pounds, NP ? ?Date: 09/26/2021 12:41 PM ? ?Virtual Visit via Video Note  ? ?IGildardo Pounds, connected with  Carla [name]  ([id number], [birth year]) on 09/26/21 at 12:30 PM EDT by a video-enabled telemedicine application and verified that I am speaking with the correct person using two identifiers. ? ?Location: ?Patient: Virtual Visit Location Patient:  Home ?Provider: Virtual Visit Location Provider: Home Office ?  ?I discussed the limitations of evaluation and management by telemedicine and the availability of in person appointments. The patient expressed understanding and agreed to proceed.   ? ?History of Present Illness: ?Carla [name] is a 31 y.o. who identifies as a female who was assigned female at birth, and is being seen today for bacterial sinusitis. ? ?Endorses a history of chronic allergies. Usually gets a sinus infection around the same time every year which is currently. Symptoms are as follows: headache, fatigue, cough, left sided facial pain, sinus pressure over the past several days.  ? ? ?Problems:  ?Patient Active Problem List  ? Diagnosis Date Noted  ? Postpartum depression 09/30/2019  ? Chronic hypertension in obstetric context, third trimester 08/19/2019  ? SVD (spontaneous vaginal delivery) 08/19/2019  ?  ?Allergies:  ?Allergies  ?Allergen Reactions  ? Shellfish Allergy Anaphylaxis  ? Latex Itching  ? ?Medications:  ?Current Outpatient Medications:  ?  amoxicillin-clavulanate (AUGMENTIN) 875-125 MG tablet, Take 1 tablet by mouth 2 (two) times daily for 5 days., Disp: 10 tablet, Rfl: 0 ?  albuterol (VENTOLIN HFA) 108 (90 Base) MCG/ACT inhaler, Inhale 2 puffs into the lungs every 6 (six) hours as needed for wheezing or shortness of breath., Disp: 8 g, Rfl: 0 ?  benzonatate (TESSALON PERLES) 100 MG capsule, Take 1 capsule (100 mg total) by mouth 3 (three) times daily as needed for cough., Disp: 20 capsule, Rfl: 0 ?  Blood Pressure  Monitoring (BLOOD PRESSURE KIT) DEVI, 1 kit by Does not apply route once a week. CHECK BP WEEKLY.  LARGE CUFF. DX O09.0, Disp: 1 kit, Rfl: 0 ?  calcium carbonate (CALCIUM 600) 600 MG TABS tablet, Take 600 mg by mouth 2 (two) times daily with a meal., Disp: , Rfl:  ?  cetirizine (ZYRTEC ALLERGY) 10 MG tablet, Take 1 tablet (10 mg total) by mouth daily., Disp: 90 tablet, Rfl: 0 ?  cyclobenzaprine (FLEXERIL) 10 MG  tablet, Take 1 tablet (10 mg total) by mouth 3 (three) times daily as needed for muscle spasms., Disp: 30 tablet, Rfl: 0 ?  ibuprofen (ADVIL) 800 MG tablet, Take 1 tablet (800 mg total) by mouth every 8 (eight) hours as needed., Disp: 30 tablet, Rfl: 0 ?  ondansetron (ZOFRAN) 4 MG tablet, Take 1 tablet (4 mg total) by mouth every 8 (eight) hours as needed for nausea or vomiting., Disp: 10 tablet, Rfl: 0 ?  pantoprazole (PROTONIX) 40 MG tablet, Take 1 tablet (40 mg total) by mouth daily., Disp: 30 tablet, Rfl: 0 ?  Prenatal Vit-Fe Fumarate-FA (PRENATAL MULTIVITAMIN) TABS tablet, Take 1 tablet by mouth daily at 12 noon., Disp: , Rfl:  ?  sucralfate (CARAFATE) 1 GM/10ML suspension, Take 10 mLs (1 g total) by mouth 4 (four) times daily -  with meals and at bedtime., Disp: 420 mL, Rfl: 0 ?  Ubrogepant (UBRELVY) 50 MG TABS, Take 50 mg by mouth every 2 (two) hours as needed., Disp: 10 tablet, Rfl: 0 ? ?Observations/Objective: ?Patient is well-developed, well-nourished in no acute distress.  ?Resting comfortably  at home.  ?Head is normocephalic, atraumatic.  ?No labored breathing.  ?Speech is clear and coherent with logical content.  ?Patient is alert and oriented at baseline.  ? ? ?Assessment and Plan: ?1. Bacterial sinusitis ?- amoxicillin-clavulanate (AUGMENTIN) 875-125 MG tablet; Take 1 tablet by mouth 2 (two) times daily for 5 days.  Dispense: 10 tablet; Refill: 0 ? ? ?Follow Up Instructions: ?I discussed the assessment and treatment plan with the patient. The patient was provided an opportunity to ask questions and all were answered. The patient agreed with the plan and demonstrated an understanding of the instructions.  A copy of instructions were sent to the patient via MyChart unless otherwise noted below.  ? ? ?The patient was advised to call back or seek an in-person evaluation if the symptoms worsen or if the condition fails to improve as anticipated. ? ?Time:  ?I spent 11 minutes with the patient via  telehealth technology discussing the above problems/concerns.   ? ?Gildardo Pounds, NP  ?

## 2022-02-15 ENCOUNTER — Telehealth: Payer: 59 | Admitting: Family Medicine

## 2022-02-15 DIAGNOSIS — J019 Acute sinusitis, unspecified: Secondary | ICD-10-CM

## 2022-02-15 DIAGNOSIS — B9689 Other specified bacterial agents as the cause of diseases classified elsewhere: Secondary | ICD-10-CM | POA: Diagnosis not present

## 2022-02-15 MED ORDER — AMOXICILLIN-POT CLAVULANATE 875-125 MG PO TABS: 1.0000 | ORAL_TABLET | Freq: Two times a day (BID) | ORAL | 0 refills | Status: AC

## 2022-02-15 NOTE — Patient Instructions (Signed)
Carla [name], thank you for joining Perlie Mayo, NP for today's virtual visit.  While this provider is not your primary care provider (PCP), if your PCP is located in our provider database this encounter information will be shared with them immediately following your visit.  Consent: (Patient) Carla [name] provided verbal consent for this virtual visit at the beginning of the encounter.  Current Medications:  Current Outpatient Medications:    amoxicillin-clavulanate (AUGMENTIN) 875-125 MG tablet, Take 1 tablet by mouth 2 (two) times daily for 7 days., Disp: 14 tablet, Rfl: 0   albuterol (VENTOLIN HFA) 108 (90 Base) MCG/ACT inhaler, Inhale 2 puffs into the lungs every 6 (six) hours as needed for wheezing or shortness of breath., Disp: 8 g, Rfl: 0   benzonatate (TESSALON PERLES) 100 MG capsule, Take 1 capsule (100 mg total) by mouth 3 (three) times daily as needed for cough., Disp: 20 capsule, Rfl: 0   Blood Pressure Monitoring (BLOOD PRESSURE KIT) DEVI, 1 kit by Does not apply route once a week. CHECK BP WEEKLY.  LARGE CUFF. DX O09.0, Disp: 1 kit, Rfl: 0   calcium carbonate (CALCIUM 600) 600 MG TABS tablet, Take 600 mg by mouth 2 (two) times daily with a meal., Disp: , Rfl:    cetirizine (ZYRTEC ALLERGY) 10 MG tablet, Take 1 tablet (10 mg total) by mouth daily., Disp: 90 tablet, Rfl: 0   cyclobenzaprine (FLEXERIL) 10 MG tablet, Take 1 tablet (10 mg total) by mouth 3 (three) times daily as needed for muscle spasms., Disp: 30 tablet, Rfl: 0   ibuprofen (ADVIL) 800 MG tablet, Take 1 tablet (800 mg total) by mouth every 8 (eight) hours as needed., Disp: 30 tablet, Rfl: 0   ondansetron (ZOFRAN) 4 MG tablet, Take 1 tablet (4 mg total) by mouth every 8 (eight) hours as needed for nausea or vomiting., Disp: 10 tablet, Rfl: 0   pantoprazole (PROTONIX) 40 MG tablet, Take 1 tablet (40 mg total) by mouth daily., Disp: 30 tablet, Rfl: 0   Prenatal Vit-Fe Fumarate-FA (PRENATAL MULTIVITAMIN) TABS  tablet, Take 1 tablet by mouth daily at 12 noon., Disp: , Rfl:    sucralfate (CARAFATE) 1 GM/10ML suspension, Take 10 mLs (1 g total) by mouth 4 (four) times daily -  with meals and at bedtime., Disp: 420 mL, Rfl: 0   Ubrogepant (UBRELVY) 50 MG TABS, Take 50 mg by mouth every 2 (two) hours as needed., Disp: 10 tablet, Rfl: 0   Medications ordered in this encounter:  Meds ordered this encounter  Medications   amoxicillin-clavulanate (AUGMENTIN) 875-125 MG tablet    Sig: Take 1 tablet by mouth 2 (two) times daily for 7 days.    Dispense:  14 tablet    Refill:  0    Order Specific Question:   Supervising Provider    Answer:   Chase Picket [id number]     *If you need refills on other medications prior to your next appointment, please contact your pharmacy*  Follow-Up: Call back or seek an in-person evaluation if the symptoms worsen or if the condition fails to improve as anticipated.  [facility] [phone]  Other Instructions Rest, hydrate, take medication in full, start flonase daily as well  If you have been instructed to have an in-person evaluation today at a local Urgent Care facility, please use the link below. It will take you to a list of all of our available [facility], including address, phone number and  hours of operation. Please do not delay care.  [facility]  If you or a family member do not have a primary care provider, use the link below to schedule a visit and establish care. When you choose a [facility] primary care physician or advanced practice provider, you gain a long-term partner in health. Find a Primary Care Provider  Learn more about [facility]'s in-office and virtual care options: [facility]

## 2022-02-15 NOTE — Progress Notes (Signed)
Virtual Visit Consent   Carla [name], you are scheduled for a virtual visit with a [facility] provider today. Just as with appointments in the office, your consent must be obtained to participate. Your consent will be active for this visit and any virtual visit you may have with one of our providers in the next 365 days. If you have a MyChart account, a copy of this consent can be sent to you electronically.  As this is a virtual visit, video technology does not allow for your provider to perform a traditional examination. This may limit your provider's ability to fully assess your condition. If your provider identifies any concerns that need to be evaluated in person or the need to arrange testing (such as labs, EKG, etc.), we will make arrangements to do so. Although advances in technology are sophisticated, we cannot ensure that it will always work on either your end or our end. If the connection with a video visit is poor, the visit may have to be switched to a telephone visit. With either a video or telephone visit, we are not always able to ensure that we have a secure connection.  By engaging in this virtual visit, you consent to the provision of healthcare and authorize for your insurance to be billed (if applicable) for the services provided during this visit. Depending on your insurance coverage, you may receive a charge related to this service.  I need to obtain your verbal consent now. Are you willing to proceed with your visit today? Carla [name] has provided verbal consent on 02/15/2022 for a virtual visit (video or telephone). Perlie Mayo, NP  Date: 02/15/2022 12:03 PM  Virtual Visit via Video Note   I, Perlie Mayo, connected with  Carla [name]  ([id number], 10/14/1990) on 02/15/22 at 12:00 PM EDT by a video-enabled telemedicine application and verified that I am speaking with the correct person using two identifiers.  Location: Patient: Virtual Visit Location Patient:  Home Provider: Virtual Visit Location Provider: Home Office   I discussed the limitations of evaluation and management by telemedicine and the availability of in person appointments. The patient expressed understanding and agreed to proceed.    History of Present Illness: Carla [name] is a 31 y.o. who identifies as a female who was assigned female at birth, and is being seen today for sore throat and headache. Suafed helps some. COVID- negative  HPI: Sinusitis This is a new problem. The current episode started in the past 7 days. The problem has been gradually worsening since onset. There has been no fever. Associated symptoms include congestion, ear pain, headaches, sinus pressure and a sore throat. Pertinent negatives include no chills, coughing, diaphoresis, hoarse voice, neck pain, shortness of breath, sneezing or swollen glands. Past treatments include oral decongestants. The treatment provided mild relief.    Problems:  Patient Active Problem List   Diagnosis Date Noted   Postpartum depression 09/30/2019   Chronic hypertension in obstetric context, third trimester 08/19/2019   SVD (spontaneous vaginal delivery) 08/19/2019    Allergies:  Allergies  Allergen Reactions   Shellfish Allergy Anaphylaxis   Latex Itching   Medications:  Current Outpatient Medications:    albuterol (VENTOLIN HFA) 108 (90 Base) MCG/ACT inhaler, Inhale 2 puffs into the lungs every 6 (six) hours as needed for wheezing or shortness of breath., Disp: 8 g, Rfl: 0   benzonatate (TESSALON PERLES) 100 MG capsule, Take 1 capsule (100 mg total) by mouth 3 (three)  times daily as needed for cough., Disp: 20 capsule, Rfl: 0   Blood Pressure Monitoring (BLOOD PRESSURE KIT) DEVI, 1 kit by Does not apply route once a week. CHECK BP WEEKLY.  LARGE CUFF. DX O09.0, Disp: 1 kit, Rfl: 0   calcium carbonate (CALCIUM 600) 600 MG TABS tablet, Take 600 mg by mouth 2 (two) times daily with a meal., Disp: , Rfl:    cetirizine  (ZYRTEC ALLERGY) 10 MG tablet, Take 1 tablet (10 mg total) by mouth daily., Disp: 90 tablet, Rfl: 0   cyclobenzaprine (FLEXERIL) 10 MG tablet, Take 1 tablet (10 mg total) by mouth 3 (three) times daily as needed for muscle spasms., Disp: 30 tablet, Rfl: 0   ibuprofen (ADVIL) 800 MG tablet, Take 1 tablet (800 mg total) by mouth every 8 (eight) hours as needed., Disp: 30 tablet, Rfl: 0   ondansetron (ZOFRAN) 4 MG tablet, Take 1 tablet (4 mg total) by mouth every 8 (eight) hours as needed for nausea or vomiting., Disp: 10 tablet, Rfl: 0   pantoprazole (PROTONIX) 40 MG tablet, Take 1 tablet (40 mg total) by mouth daily., Disp: 30 tablet, Rfl: 0   Prenatal Vit-Fe Fumarate-FA (PRENATAL MULTIVITAMIN) TABS tablet, Take 1 tablet by mouth daily at 12 noon., Disp: , Rfl:    sucralfate (CARAFATE) 1 GM/10ML suspension, Take 10 mLs (1 g total) by mouth 4 (four) times daily -  with meals and at bedtime., Disp: 420 mL, Rfl: 0   Ubrogepant (UBRELVY) 50 MG TABS, Take 50 mg by mouth every 2 (two) hours as needed., Disp: 10 tablet, Rfl: 0  Observations/Objective: Patient is well-developed, well-nourished in no acute distress.  Resting comfortably  at home.  Head is normocephalic, atraumatic.  No labored breathing.  Speech is clear and coherent with logical content.  Patient is alert and oriented at baseline.    Assessment and Plan: 1. Acute bacterial sinusitis  - amoxicillin-clavulanate (AUGMENTIN) 875-125 MG tablet; Take 1 tablet by mouth 2 (two) times daily for 7 days.  Dispense: 14 tablet; Refill: 0   -rest -hydrate -abx as above -continue allergy medications, start flonase daily as well for next several weeks   Reviewed side effects, risks and benefits of medication.    Patient acknowledged agreement and understanding of the plan.   Past Medical, Surgical, Social History, Allergies, and Medications have been Reviewed.   Follow Up Instructions: I discussed the assessment and treatment plan with  the patient. The patient was provided an opportunity to ask questions and all were answered. The patient agreed with the plan and demonstrated an understanding of the instructions.  A copy of instructions were sent to the patient via MyChart unless otherwise noted below.    The patient was advised to call back or seek an in-person evaluation if the symptoms worsen or if the condition fails to improve as anticipated.  Time:  I spent 10 minutes with the patient via telehealth technology discussing the above problems/concerns.    Perlie Mayo, NP

## 2022-03-12 ENCOUNTER — Telehealth: Payer: 59 | Admitting: Physician Assistant

## 2022-03-12 DIAGNOSIS — R3989 Other symptoms and signs involving the genitourinary system: Secondary | ICD-10-CM | POA: Diagnosis not present

## 2022-03-12 MED ORDER — CEPHALEXIN 500 MG PO CAPS: 500.0000 mg | ORAL_CAPSULE | Freq: Two times a day (BID) | ORAL | 0 refills | Status: AC

## 2022-03-12 MED ORDER — FLUCONAZOLE 150 MG PO TABS: 150.0000 mg | ORAL_TABLET | Freq: Once | ORAL | 0 refills | Status: AC

## 2022-03-12 NOTE — Patient Instructions (Addendum)
Carla [name], thank you for joining [name], PA-C for today's virtual visit.  While this provider is not your primary care provider (PCP), if your PCP is located in our provider database this encounter information will be shared with them immediately following your visit.   [facility] account gives you access to today's visit and all your visits, tests, and labs performed at [facility] " click here if you don't have a [facility] account or go to [email or web address]  Consent: (Patient) Carla [name] provided verbal consent for this virtual visit at the beginning of the encounter.  Current Medications:  Current Outpatient Medications:    albuterol (VENTOLIN HFA) 108 (90 Base) MCG/ACT inhaler, Inhale 2 puffs into the lungs every 6 (six) hours as needed for wheezing or shortness of breath., Disp: 8 g, Rfl: 0   benzonatate (TESSALON PERLES) 100 MG capsule, Take 1 capsule (100 mg total) by mouth 3 (three) times daily as needed for cough., Disp: 20 capsule, Rfl: 0   Blood Pressure Monitoring (BLOOD PRESSURE KIT) DEVI, 1 kit by Does not apply route once a week. CHECK BP WEEKLY.  LARGE CUFF. DX O09.0, Disp: 1 kit, Rfl: 0   calcium carbonate (CALCIUM 600) 600 MG TABS tablet, Take 600 mg by mouth 2 (two) times daily with a meal., Disp: , Rfl:    cetirizine (ZYRTEC ALLERGY) 10 MG tablet, Take 1 tablet (10 mg total) by mouth daily., Disp: 90 tablet, Rfl: 0   cyclobenzaprine (FLEXERIL) 10 MG tablet, Take 1 tablet (10 mg total) by mouth 3 (three) times daily as needed for muscle spasms., Disp: 30 tablet, Rfl: 0   ibuprofen (ADVIL) 800 MG tablet, Take 1 tablet (800 mg total) by mouth every 8 (eight) hours as needed., Disp: 30 tablet, Rfl: 0   ondansetron (ZOFRAN) 4 MG tablet, Take 1 tablet (4 mg total) by mouth every 8 (eight) hours as needed for nausea or vomiting., Disp: 10 tablet, Rfl: 0   pantoprazole (PROTONIX) 40 MG tablet, Take 1 tablet (40 mg total)  by mouth daily., Disp: 30 tablet, Rfl: 0   Prenatal Vit-Fe Fumarate-FA (PRENATAL MULTIVITAMIN) TABS tablet, Take 1 tablet by mouth daily at 12 noon., Disp: , Rfl:    sucralfate (CARAFATE) 1 GM/10ML suspension, Take 10 mLs (1 g total) by mouth 4 (four) times daily -  with meals and at bedtime., Disp: 420 mL, Rfl: 0   Ubrogepant (UBRELVY) 50 MG TABS, Take 50 mg by mouth every 2 (two) hours as needed., Disp: 10 tablet, Rfl: 0   Medications ordered in this encounter:  No orders of the defined types were placed in this encounter.    *If you need refills on other medications prior to your next appointment, please contact your pharmacy*  Follow-Up: Call back or seek an in-person evaluation if the symptoms worsen or if the condition fails to improve as anticipated.  [facility] [phone]  Other Instructions Your symptoms are consistent with a bladder infection, also called acute cystitis. Please take your antibiotic (Keflex) as directed until all pills are gone.  Stay very well hydrated.  Consider a daily probiotic (Align, Culturelle, or Activia) to help prevent stomach upset caused by the antibiotic.  Taking a probiotic daily may also help prevent recurrent UTIs.  Also consider taking AZO (Phenazopyridine) tablets to help decrease pain with urination.   Urinary Tract Infection A urinary tract infection (UTI) can occur any place along the urinary tract. The tract  includes the kidneys, ureters, bladder, and urethra. A type of germ called bacteria often causes a UTI. UTIs are often helped with antibiotic medicine.  HOME CARE  If given, take antibiotics as told by your doctor. Finish them even if you start to feel better. Drink enough fluids to keep your pee (urine) clear or pale yellow. Avoid tea, drinks with caffeine, and bubbly (carbonated) drinks. Pee often. Avoid holding your pee in for a long time. Pee before and after having sex (intercourse). Wipe from front to back after  you poop (bowel movement) if you are a woman. Use each tissue only once. GET HELP RIGHT AWAY IF:  You have back pain. You have lower belly (abdominal) pain. You have chills. You feel sick to your stomach (nauseous). You throw up (vomit). Your burning or discomfort with peeing does not go away. You have a fever. Your symptoms are not better in 3 days. MAKE SURE YOU:  Understand these instructions. Will watch your condition. Will get help right away if you are not doing well or get worse. Document Released: 10/26/2007 Document Revised: 02/01/2012 Document Reviewed: 12/08/2011 [facility] Patient Information 2015 [place], [place]. This information is not intended to replace advice given to you by your health care provider. Make sure you discuss any questions you have with your health care provider.    If you have been instructed to have an in-person evaluation today at a local Urgent Care facility, please use the link below. It will take you to a list of all of our available [facility], including address, phone number and hours of operation. Please do not delay care.  [facility]  If you or a family member do not have a primary care provider, use the link below to schedule a visit and establish care. When you choose a [facility] primary care physician or advanced practice provider, you gain a long-term partner in health. Find a Primary Care Provider  Learn more about [facility]'s in-office and virtual care options: [facility]

## 2022-03-12 NOTE — Progress Notes (Signed)
Virtual Visit Consent   Carla [name], you are scheduled for a virtual visit with a [facility] provider today. Just as with appointments in the office, your consent must be obtained to participate. Your consent will be active for this visit and any virtual visit you may have with one of our providers in the next 365 days. If you have a MyChart account, a copy of this consent can be sent to you electronically.  As this is a virtual visit, video technology does not allow for your provider to perform a traditional examination. This may limit your provider's ability to fully assess your condition. If your provider identifies any concerns that need to be evaluated in person or the need to arrange testing (such as labs, EKG, etc.), we will make arrangements to do so. Although advances in technology are sophisticated, we cannot ensure that it will always work on either your end or our end. If the connection with a video visit is poor, the visit may have to be switched to a telephone visit. With either a video or telephone visit, we are not always able to ensure that we have a secure connection.  By engaging in this virtual visit, you consent to the provision of healthcare and authorize for your insurance to be billed (if applicable) for the services provided during this visit. Depending on your insurance coverage, you may receive a charge related to this service.  I need to obtain your verbal consent now. Are you willing to proceed with your visit today? RENISHA COCKRUM has provided verbal consent on 03/12/2022 for a virtual visit (video or telephone). [name], [place]  Date: 03/12/2022 6:19 PM  Virtual Visit via Video Note   I, [name], connected with  SOLA MARGOLIS  ([id number], 01/05/1991) on 03/12/22 at  6:15 PM EDT by a video-enabled telemedicine application and verified that I am speaking with the correct person using two identifiers.  Location: Patient: Virtual Visit  Location Patient: Home Provider: Virtual Visit Location Provider: Home Office   I discussed the limitations of evaluation and management by telemedicine and the availability of in person appointments. The patient expressed understanding and agreed to proceed.    History of Present Illness: Carla [name] is a 31 y.o. who identifies as a female who was assigned female at birth, and is being seen today for possible uti. Notes 2.5 days of urinary urgency, frequency, hesitancy, dysuria. Denies fever, chills, nausea or vomiting. Denies vaginal symptoms. LMP --  current.   HPI: HPI  Problems:  Patient Active Problem List   Diagnosis Date Noted   Postpartum depression 09/30/2019   Chronic hypertension in obstetric context, third trimester 08/19/2019   SVD (spontaneous vaginal delivery) 08/19/2019    Allergies:  Allergies  Allergen Reactions   Shellfish Allergy Anaphylaxis   Latex Itching   Medications:  Current Outpatient Medications:    albuterol (VENTOLIN HFA) 108 (90 Base) MCG/ACT inhaler, Inhale 2 puffs into the lungs every 6 (six) hours as needed for wheezing or shortness of breath., Disp: 8 g, Rfl: 0   Blood Pressure Monitoring (BLOOD PRESSURE KIT) DEVI, 1 kit by Does not apply route once a week. CHECK BP WEEKLY.  LARGE CUFF. DX O09.0, Disp: 1 kit, Rfl: 0   calcium carbonate (CALCIUM 600) 600 MG TABS tablet, Take 600 mg by mouth 2 (two) times daily with a meal., Disp: , Rfl:    cetirizine (ZYRTEC ALLERGY) 10 MG tablet, Take 1 tablet (10 mg  total) by mouth daily., Disp: 90 tablet, Rfl: 0   cyclobenzaprine (FLEXERIL) 10 MG tablet, Take 1 tablet (10 mg total) by mouth 3 (three) times daily as needed for muscle spasms., Disp: 30 tablet, Rfl: 0   ibuprofen (ADVIL) 800 MG tablet, Take 1 tablet (800 mg total) by mouth every 8 (eight) hours as needed., Disp: 30 tablet, Rfl: 0   Prenatal Vit-Fe Fumarate-FA (PRENATAL MULTIVITAMIN) TABS tablet, Take 1 tablet by mouth daily at 12 noon., Disp: ,  Rfl:    Ubrogepant (UBRELVY) 50 MG TABS, Take 50 mg by mouth every 2 (two) hours as needed., Disp: 10 tablet, Rfl: 0  Observations/Objective: Patient is well-developed, well-nourished in no acute distress.  Resting comfortably at home.  Head is normocephalic, atraumatic.  No labored breathing. Speech is clear and coherent with logical content.  Patient is alert and oriented at baseline.   Assessment and Plan: 1. Suspected UTI  Classic UTI symptoms with absence of alarm signs or symptoms. Prior history of UTI. Will treat empirically with Keflex for suspected uncomplicated cystitis. Supportive measures and OTC medications reviewed. Strict in-person evaluation precautions discussed.    Follow Up Instructions: I discussed the assessment and treatment plan with the patient. The patient was provided an opportunity to ask questions and all were answered. The patient agreed with the plan and demonstrated an understanding of the instructions.  A copy of instructions were sent to the patient via MyChart unless otherwise noted below.   The patient was advised to call back or seek an in-person evaluation if the symptoms worsen or if the condition fails to improve as anticipated.  Time:  I spent 10 minutes with the patient via telehealth technology discussing the above problems/concerns.    [name], PA-C

## 2022-06-08 ENCOUNTER — Telehealth: Payer: Commercial Managed Care - PPO | Admitting: Physician Assistant

## 2022-06-08 ENCOUNTER — Ambulatory Visit (INDEPENDENT_AMBULATORY_CARE_PROVIDER_SITE_OTHER): Payer: Commercial Managed Care - PPO | Admitting: Internal Medicine

## 2022-06-08 ENCOUNTER — Encounter: Payer: Self-pay | Admitting: Internal Medicine

## 2022-06-08 ENCOUNTER — Ambulatory Visit: Payer: Commercial Managed Care - PPO

## 2022-06-08 VITALS — BP 138/94 | HR 80 | Temp 98.4°F | Ht 64.2 in | Wt 207.0 lb

## 2022-06-08 DIAGNOSIS — Z7689 Persons encountering health services in other specified circumstances: Secondary | ICD-10-CM

## 2022-06-08 DIAGNOSIS — Z6835 Body mass index (BMI) 35.0-35.9, adult: Secondary | ICD-10-CM | POA: Diagnosis not present

## 2022-06-08 DIAGNOSIS — E6609 Other obesity due to excess calories: Secondary | ICD-10-CM

## 2022-06-08 DIAGNOSIS — I10 Essential (primary) hypertension: Secondary | ICD-10-CM

## 2022-06-08 DIAGNOSIS — G43709 Chronic migraine without aura, not intractable, without status migrainosus: Secondary | ICD-10-CM

## 2022-06-08 DIAGNOSIS — Z91013 Allergy to seafood: Secondary | ICD-10-CM

## 2022-06-08 DIAGNOSIS — D649 Anemia, unspecified: Secondary | ICD-10-CM | POA: Diagnosis not present

## 2022-06-08 DIAGNOSIS — R635 Abnormal weight gain: Secondary | ICD-10-CM | POA: Diagnosis not present

## 2022-06-08 LAB — POCT URINALYSIS DIPSTICK
Bilirubin, UA: NEGATIVE
Glucose, UA: NEGATIVE
Leukocytes, UA: NEGATIVE
Nitrite, UA: NEGATIVE
Protein, UA: POSITIVE — AB
Spec Grav, UA: 1.02 (ref 1.010–1.025)
Urobilinogen, UA: 0.2 E.U./dL
pH, UA: 7 (ref 5.0–8.0)

## 2022-06-08 MED ORDER — AMLODIPINE BESYLATE 5 MG PO TABS
5.0000 mg | ORAL_TABLET | Freq: Every day | ORAL | 10 refills | Status: DC
  Filled 2022-07-20: qty 30, 30d supply, fill #0
  Filled 2022-08-19 – 2022-09-07 (×2): qty 30, 30d supply, fill #1
  Filled 2022-10-10 – 2022-10-26 (×3): qty 30, 30d supply, fill #2
  Filled 2022-11-22: qty 30, 30d supply, fill #3
  Filled 2022-12-22: qty 30, 30d supply, fill #4
  Filled 2023-02-02 – 2023-02-20 (×2): qty 30, 30d supply, fill #5
  Filled 2023-04-03: qty 30, 30d supply, fill #6
  Filled 2023-05-10: qty 30, 30d supply, fill #7

## 2022-06-08 MED ORDER — EPINEPHRINE 0.3 MG/0.3ML IJ SOAJ
0.3000 mg | INTRAMUSCULAR | 5 refills | Status: AC | PRN
  Filled 2022-07-20: qty 2, 2d supply, fill #0

## 2022-06-08 MED ORDER — UBRELVY 100 MG PO TABS
ORAL_TABLET | ORAL | 0 refills | Status: AC
  Filled 2022-07-20: qty 20, 10d supply, fill #0
  Filled 2022-09-07: qty 20, 20d supply, fill #0

## 2022-06-08 NOTE — Patient Instructions (Addendum)
Magnesium glycinate ? ?Hypertension, Adult ?Hypertension is another name for high blood pressure. High blood pressure forces your heart to work harder to pump blood. This can cause problems over time. ?There are two numbers in a blood pressure reading. There is a top number (systolic) over a bottom number (diastolic). It is best to have a blood pressure that is below 120/80. ?What are the causes? ?The cause of this condition is not known. Some other conditions can lead to high blood pressure. ?What increases the risk? ?Some lifestyle factors can make you more likely to develop high blood pressure: ?Smoking. ?Not getting enough exercise or physical activity. ?Being overweight. ?Having too much fat, sugar, calories, or salt (sodium) in your diet. ?Drinking too much alcohol. ?Other risk factors include: ?Having any of these conditions: ?Heart disease. ?Diabetes. ?High cholesterol. ?Kidney disease. ?Obstructive sleep apnea. ?Having a family history of high blood pressure and high cholesterol. ?Age. The risk increases with age. ?Stress. ?What are the signs or symptoms? ?High blood pressure may not cause symptoms. Very high blood pressure (hypertensive crisis) may cause: ?Headache. ?Fast or uneven heartbeats (palpitations). ?Shortness of breath. ?Nosebleed. ?Vomiting or feeling like you may vomit (nauseous). ?Changes in how you see. ?Very bad chest pain. ?Feeling dizzy. ?Seizures. ?How is this treated? ?This condition is treated by making healthy lifestyle changes, such as: ?Eating healthy foods. ?Exercising more. ?Drinking less alcohol. ?Your doctor may prescribe medicine if lifestyle changes do not help enough and if: ?Your top number is above 130. ?Your bottom number is above 80. ?Your personal target blood pressure may vary. ?Follow these instructions at home: ?Eating and drinking ? ?If told, follow the DASH eating plan. To follow this plan: ?Fill one half of your plate at each meal with fruits and vegetables. ?Fill  one fourth of your plate at each meal with whole grains. Whole grains include whole-wheat pasta, brown rice, and whole-grain bread. ?Eat or drink low-fat dairy products, such as skim milk or low-fat yogurt. ?Fill one fourth of your plate at each meal with low-fat (lean) proteins. Low-fat proteins include fish, chicken without skin, eggs, beans, and tofu. ?Avoid fatty meat, cured and processed meat, or chicken with skin. ?Avoid pre-made or processed food. ?Limit the amount of salt in your diet to less than 1,500 mg each day. ?Do not drink alcohol if: ?Your doctor tells you not to drink. ?You are pregnant, may be pregnant, or are planning to become pregnant. ?If you drink alcohol: ?Limit how much you have to: ?0-1 drink a day for women. ?0-2 drinks a day for men. ?Know how much alcohol is in your drink. In the [place] one drink equals one 12 oz bottle of beer (355 mL), one 5 oz glass of wine (148 mL), or one 1? oz glass of hard liquor (44 mL). ?Lifestyle ? ?Work with your doctor to stay at a healthy weight or to lose weight. Ask your doctor what the best weight is for you. ?Get at least 30 minutes of exercise that causes your heart to beat faster (aerobic exercise) most days of the week. This may include walking, swimming, or biking. ?Get at least 30 minutes of exercise that strengthens your muscles (resistance exercise) at least 3 days a week. This may include lifting weights or doing Pilates. ?Do not smoke or use any products that contain nicotine or tobacco. If you need help quitting, ask your doctor. ?Check your blood pressure at home as told by your doctor. ?Keep all follow-up visits. ?Medicines ?Take over-the-counter   and prescription medicines only as told by your doctor. Follow directions carefully. ?Do not skip doses of blood pressure medicine. The medicine does not work as well if you skip doses. Skipping doses also puts you at risk for problems. ?Ask your doctor about side effects or reactions to medicines  that you should watch for. ?Contact a doctor if: ?You think you are having a reaction to the medicine you are taking. ?You have headaches that keep coming back. ?You feel dizzy. ?You have swelling in your ankles. ?You have trouble with your vision. ?Get help right away if: ?You get a very bad headache. ?You start to feel mixed up (confused). ?You feel weak or numb. ?You feel faint. ?You have very bad pain in your: ?Chest. ?Belly (abdomen). ?You vomit more than once. ?You have trouble breathing. ?These symptoms may be an emergency. Get help right away. Call 911. ?Do not wait to see if the symptoms will go away. ?Do not drive yourself to the hospital. ?Summary ?Hypertension is another name for high blood pressure. ?High blood pressure forces your heart to work harder to pump blood. ?For most people, a normal blood pressure is less than 120/80. ?Making healthy choices can help lower blood pressure. If your blood pressure does not get lower with healthy choices, you may need to take medicine. ?This information is not intended to replace advice given to you by your health care provider. Make sure you discuss any questions you have with your health care provider. ?Document Revised: 02/25/2021 Document Reviewed: 02/25/2021 ?Elsevier Patient Education ? 2023 [employer] ? ?

## 2022-06-08 NOTE — Progress Notes (Signed)
Virtual Visit Consent   Carla [name], you are scheduled for a virtual visit with a [facility] provider today. Just as with appointments in the office, your consent must be obtained to participate. Your consent will be active for this visit and any virtual visit you may have with one of our providers in the next 365 days. If you have a MyChart account, a copy of this consent can be sent to you electronically.  As this is a virtual visit, video technology does not allow for your provider to perform a traditional examination. This may limit your provider's ability to fully assess your condition. If your provider identifies any concerns that need to be evaluated in person or the need to arrange testing (such as labs, EKG, etc.), we will make arrangements to do so. Although advances in technology are sophisticated, we cannot ensure that it will always work on either your end or our end. If the connection with a video visit is poor, the visit may have to be switched to a telephone visit. With either a video or telephone visit, we are not always able to ensure that we have a secure connection.  By engaging in this virtual visit, you consent to the provision of healthcare and authorize for your insurance to be billed (if applicable) for the services provided during this visit. Depending on your insurance coverage, you may receive a charge related to this service.  I need to obtain your verbal consent now. Are you willing to proceed with your visit today? [name] has provided verbal consent on 06/08/2022 for a virtual visit (video or telephone). [name], [place]  Date: 06/08/2022 10:08 AM  Virtual Visit via Video Note   I, [name], connected with  MARLIA SCHEWE  ([id number], Feb 19, 1991) on 06/08/22 at 10:00 AM EST by a video-enabled telemedicine application and verified that I am speaking with the correct person using two identifiers.  Location: Patient: Virtual Visit  Location Patient: Home Provider: Virtual Visit Location Provider: Home Office   I discussed the limitations of evaluation and management by telemedicine and the availability of in person appointments. The patient expressed understanding and agreed to proceed.    History of Present Illness: Carla [name] is a 32 y.o. who identifies as a female who was assigned female at birth, and is being seen today for elevatged BP readings over the past few days associated with frequent migraine headaches over the past 3 weeks. Notes yesterday checking BP at work and it was high at 160/105. Today when checked is at 173/98. Does have headache. Occasional lightheadedness but none at present. Denies chest pain or SOB. Has history of postpartum hypertension but resolved itself within a month after delivery. Does note substantial stress and poor sleep.  Is overweight. PCP out of network now due to change in insurance. In process of trying to get a new PCP.  HPI: HPI  Problems:  Patient Active Problem List   Diagnosis Date Noted   Postpartum depression 09/30/2019   Chronic hypertension in obstetric context, third trimester 08/19/2019   SVD (spontaneous vaginal delivery) 08/19/2019    Allergies:  Allergies  Allergen Reactions   Shellfish Allergy Anaphylaxis   Latex Itching   Medications:  Current Outpatient Medications:    Blood Pressure Monitoring (BLOOD PRESSURE KIT) DEVI, 1 kit by Does not apply route once a week. CHECK BP WEEKLY.  LARGE CUFF. DX O09.0, Disp: 1 kit, Rfl: 0   calcium carbonate (CALCIUM 600)  600 MG TABS tablet, Take 600 mg by mouth 2 (two) times daily with a meal., Disp: , Rfl:    cetirizine (ZYRTEC ALLERGY) 10 MG tablet, Take 1 tablet (10 mg total) by mouth daily., Disp: 90 tablet, Rfl: 0   ibuprofen (ADVIL) 800 MG tablet, Take 1 tablet (800 mg total) by mouth every 8 (eight) hours as needed., Disp: 30 tablet, Rfl: 0   Prenatal Vit-Fe Fumarate-FA (PRENATAL MULTIVITAMIN) TABS tablet, Take 1  tablet by mouth daily at 12 noon., Disp: , Rfl:    Ubrogepant (UBRELVY) 50 MG TABS, Take 50 mg by mouth every 2 (two) hours as needed., Disp: 10 tablet, Rfl: 0  Observations/Objective: Patient is well-developed, well-nourished in no acute distress.  Resting comfortably at home.  Head is normocephalic, atraumatic.  No labored breathing. Speech is clear and coherent with logical content.  Patient is alert and oriented at baseline.   Assessment and Plan: 1. Hypertension, unspecified type  Giving symptomatic, she needs in person evaluation for manual BP check, heart and lung exam, possible EKG and lab workup. She has been sent to be evaluated in person today.   Follow Up Instructions: I discussed the assessment and treatment plan with the patient. The patient was provided an opportunity to ask questions and all were answered. The patient agreed with the plan and demonstrated an understanding of the instructions.  A copy of instructions were sent to the patient via MyChart unless otherwise noted below.   The patient was advised to call back or seek an in-person evaluation if the symptoms worsen or if the condition fails to improve as anticipated.  Time:  I spent 10 minutes with the patient via telehealth technology discussing the above problems/concerns.    [name], PA-C

## 2022-06-08 NOTE — Patient Instructions (Signed)
  Carla [name], thank you for joining [name], PA-C for today's virtual visit.  While this provider is not your primary care provider (PCP), if your PCP is located in our provider database this encounter information will be shared with them immediately following your visit.   [facility] account gives you access to today's visit and all your visits, tests, and labs performed at [facility] " click here if you don't have a [facility] account or go to [email or web address]  Consent: (Patient) Carla [name] provided verbal consent for this virtual visit at the beginning of the encounter.  Current Medications:  Current Outpatient Medications:    Blood Pressure Monitoring (BLOOD PRESSURE KIT) DEVI, 1 kit by Does not apply route once a week. CHECK BP WEEKLY.  LARGE CUFF. DX O09.0, Disp: 1 kit, Rfl: 0   calcium carbonate (CALCIUM 600) 600 MG TABS tablet, Take 600 mg by mouth 2 (two) times daily with a meal., Disp: , Rfl:    cetirizine (ZYRTEC ALLERGY) 10 MG tablet, Take 1 tablet (10 mg total) by mouth daily., Disp: 90 tablet, Rfl: 0   ibuprofen (ADVIL) 800 MG tablet, Take 1 tablet (800 mg total) by mouth every 8 (eight) hours as needed., Disp: 30 tablet, Rfl: 0   Prenatal Vit-Fe Fumarate-FA (PRENATAL MULTIVITAMIN) TABS tablet, Take 1 tablet by mouth daily at 12 noon., Disp: , Rfl:    Ubrogepant (UBRELVY) 50 MG TABS, Take 50 mg by mouth every 2 (two) hours as needed., Disp: 10 tablet, Rfl: 0   Medications ordered in this encounter:  No orders of the defined types were placed in this encounter.    *If you need refills on other medications prior to your next appointment, please contact your pharmacy*  Follow-Up: Call back or seek an in-person evaluation if the symptoms worsen or if the condition fails to improve as anticipated.  [place] [phone]  Other Instructions Use the link below to be seen at one of our urgent cares  today for needed evaluation and proper management, especially since you are between providers.  If you note any chest pain, dizziness, abrupt vision changes -- seek ER evaluation.   If you have been instructed to have an in-person evaluation today at a local Urgent Care facility, please use the link below. It will take you to a list of all of our available [facility], including address, phone number and hours of operation. Please do not delay care.  [facility]  If you or a family member do not have a primary care provider, use the link below to schedule a visit and establish care. When you choose a [facility] primary care physician or advanced practice provider, you gain a long-term partner in health. Find a Primary Care Provider  Learn more about [facility]'s in-office and virtual care options: [facility]

## 2022-06-08 NOTE — Progress Notes (Signed)
[place],acting as a Education administrator for Maximino Greenland, MD.,have documented all relevant documentation on the behalf of Maximino Greenland, MD,as directed by  Maximino Greenland, MD while in the presence of Maximino Greenland, MD.    Subjective:     Patient ID: Carla [name] , female    DOB: Sep 29, 1990 , 32 y.o.   MRN: [id number]   Chief Complaint  Patient presents with   Establish Care   Hypertension    HPI  Patient presents today to establish primary care. She was referred by Dr.Moore. She works for [employer] as a Naval architect. Patient would like to be seen for her elevated blood pressure. She states she has been having headaches for the past three weeks. Yesterday, she had blurred vision. She had e-visit and she was advised to go to Urgent Care; however, she was later referred here instead. She states she had pregnancy induced HTN in 2021 and it resolved after delivery.   LMP-05/28/22; she has had 3 deliveries, vaginal x 3. Ages 24, 48, and 32 year old.   Hypertension This is a new problem. The current episode started in the past 7 days. Associated symptoms include headaches. Pertinent negatives include no orthopnea, palpitations or shortness of breath.     Past Medical History:  Diagnosis Date   Allergy    Anemia    Depression    postpartum   Headache(784.0)    Hyperemesis arising during pregnancy    PONV (postoperative nausea and vomiting)    Postpartum depression    Pregnancy induced hypertension    Spontaneous abortion 12/03/2010     Family History  Problem Relation Age of Onset   Arthritis Mother    Cancer Mother    Diabetes Mother    Hearing loss Mother    Hypertension Mother    Obesity Mother    Stroke Mother    Hypertension Father    Stroke Father    ADD / ADHD Brother    Asthma Brother    Learning disabilities Brother    Alcohol abuse Paternal Aunt    Alcohol abuse Paternal Uncle    COPD Maternal Grandmother    Cancer Maternal Grandfather       Current Outpatient Medications:    amLODipine (NORVASC) 5 MG tablet, Take 1 tablet (5 mg total) by mouth daily., Disp: 30 tablet, Rfl: 11   Blood Pressure Monitoring (BLOOD PRESSURE KIT) DEVI, 1 kit by Does not apply route once a week. CHECK BP WEEKLY.  LARGE CUFF. DX O09.0, Disp: 1 kit, Rfl: 0   cetirizine (ZYRTEC ALLERGY) 10 MG tablet, Take 1 tablet (10 mg total) by mouth daily., Disp: 90 tablet, Rfl: 0   EPINEPHrine (EPIPEN 2-PAK) 0.3 mg/0.3 mL IJ SOAJ injection, Inject 0.3 mg into the muscle as needed for anaphylaxis., Disp: 1 each, Rfl: 5   ibuprofen (ADVIL) 800 MG tablet, Take 1 tablet (800 mg total) by mouth every 8 (eight) hours as needed., Disp: 30 tablet, Rfl: 0   Ubrogepant (UBRELVY) 100 MG TABS, One tab po qd prn, Disp: 20 tablet, Rfl: 0   Allergies  Allergen Reactions   Shellfish Allergy Anaphylaxis   Latex Itching     Review of Systems  Constitutional: Negative.   Eyes: Negative.   Respiratory:  Negative for shortness of breath.   Cardiovascular: Negative.  Negative for palpitations and orthopnea.  Genitourinary:  Negative for menstrual problem.  Musculoskeletal: Negative.   Skin: Negative.   Neurological:  Positive for headaches.  She reports having h/o migraines.  Psychiatric/Behavioral: Negative.       Today's Vitals   06/08/22 1457 06/08/22 1615  BP: (!) 140/100 (!) 138/94  Pulse: 80   Temp: 98.4 F (36.9 C)   Weight: 207 lb (93.9 kg)   Height: 5' 4.2" (1.631 m)   PainSc: 0-No pain    Body mass index is 35.31 kg/m.  Wt Readings from Last 3 Encounters:  06/08/22 207 lb (93.9 kg)  12/07/20 196 lb 10.4 oz (89.2 kg)  08/29/19 196 lb 9.6 oz (89.2 kg)     Objective:  Physical Exam Vitals and nursing note reviewed.  Constitutional:      Appearance: Normal appearance. She is obese.  HENT:     Head: Normocephalic and atraumatic.     Nose:     Comments: Masked     Mouth/Throat:     Comments: Masked  Eyes:     Extraocular Movements:  Extraocular movements intact.  Cardiovascular:     Rate and Rhythm: Normal rate and regular rhythm.     Heart sounds: Normal heart sounds.  Pulmonary:     Effort: Pulmonary effort is normal.     Breath sounds: Normal breath sounds.  Musculoskeletal:     Cervical back: Normal range of motion.  Skin:    General: Skin is warm.  Neurological:     General: No focal deficit present.     Mental Status: She is alert.  Psychiatric:        Mood and Affect: Mood normal.        Behavior: Behavior normal.         Assessment And Plan:     1. Essential hypertension, benign Comments: Newly diagnosed, will start her on amlodipine 5mg  qpm. Advised to start with 1/2 tab x 2 days, then increase to 1 tab. EKG performed, NSR w/ R axis. - POCT Urinalysis Dipstick (81002) - Microalbumin / Creatinine Urine Ratio - EKG 12-Lead - CMP14+EGFR - CBC - Lipid panel  2. Weight gain Comments: She has gained 11lbs since July 2022. I will check labs as below, encouraged to decrease intake of sugary beverages. - Hemoglobin A1c - TSH  3. Chronic migraine without aura without status migrainosus, not intractable Comments: Chronic, may benefit from Mg supplementation nightly. She was given samples of Ubrelvy 100mg   to take prn.  I will also send a rx. - Ubrogepant (UBRELVY) 100 MG TABS; One tab po qd prn  Dispense: 20 tablet; Refill: 0  4. Class 2 severe obesity due to excess calories with serious comorbidity and body mass index (BMI) of 35.0 to 35.9 in adult [facility]) Comments: She is encouraged to aim for at least 150 minutes of exercise/week, while striving for BMI<30 to decrease cardiac risk.  5. Shellfish allergy Comments: She was given refill on her Epipen. - EPINEPHrine (EPIPEN 2-PAK) 0.3 mg/0.3 mL IJ SOAJ injection; Inject 0.3 mg into the muscle as needed for anaphylaxis.  Dispense: 1 each; Refill: 5  6. Establishing care with new doctor, encounter for   Patient was given opportunity to ask questions.  Patient verbalized understanding of the plan and was able to repeat key elements of the plan. All questions were answered to their satisfaction.   I, Maximino Greenland, MD, have reviewed all documentation for this visit. The documentation on 06/08/22 for the exam, diagnosis, procedures, and orders are all accurate and complete.   IF YOU HAVE BEEN REFERRED TO A SPECIALIST, IT MAY TAKE 1-2 WEEKS TO SCHEDULE/PROCESS  THE REFERRAL. IF YOU HAVE NOT HEARD FROM US/SPECIALIST IN TWO WEEKS, PLEASE GIVE [place] A CALL AT [phone].   THE PATIENT IS ENCOURAGED TO PRACTICE SOCIAL DISTANCING DUE TO THE COVID-19 PANDEMIC.

## 2022-06-09 LAB — CMP14+EGFR
ALT: 10 IU/L (ref 0–32)
AST: 10 IU/L (ref 0–40)
Albumin/Globulin Ratio: 2 (ref 1.2–2.2)
Albumin: 4.5 g/dL (ref 3.9–4.9)
Alkaline Phosphatase: 91 IU/L (ref 44–121)
BUN/Creatinine Ratio: 10 (ref 9–23)
BUN: 9 mg/dL (ref 6–20)
Bilirubin Total: 0.5 mg/dL (ref 0.0–1.2)
CO2: 23 mmol/L (ref 20–29)
Calcium: 9 mg/dL (ref 8.7–10.2)
Chloride: 105 mmol/L (ref 96–106)
Creatinine, Ser: 0.9 mg/dL (ref 0.57–1.00)
Globulin, Total: 2.2 g/dL (ref 1.5–4.5)
Glucose: 78 mg/dL (ref 70–99)
Potassium: 3.8 mmol/L (ref 3.5–5.2)
Sodium: 142 mmol/L (ref 134–144)
Total Protein: 6.7 g/dL (ref 6.0–8.5)
eGFR: 87 mL/min/{1.73_m2} (ref >59)

## 2022-06-09 LAB — HEMOGLOBIN A1C
Est. average glucose Bld gHb Est-mCnc: 111 mg/dL
Hgb A1c MFr Bld: 5.5 % (ref 4.8–5.6)

## 2022-06-09 LAB — TSH: TSH: 1.48 u[IU]/mL (ref 0.450–4.500)

## 2022-06-09 LAB — CBC
Hematocrit: 32.4 % — ABNORMAL LOW (ref 34.0–46.6)
Hemoglobin: 9.9 g/dL — ABNORMAL LOW (ref 11.1–15.9)
MCH: 23.6 pg — ABNORMAL LOW (ref 26.6–33.0)
MCHC: 30.6 g/dL — ABNORMAL LOW (ref 31.5–35.7)
MCV: 77 fL — ABNORMAL LOW (ref 79–97)
Platelets: 343 10*3/uL (ref 150–450)
RBC: 4.19 x10E6/uL (ref 3.77–5.28)
RDW: 15.8 % — ABNORMAL HIGH (ref 11.7–15.4)
WBC: 7.5 10*3/uL (ref 3.4–10.8)

## 2022-06-09 LAB — LIPID PANEL
Chol/HDL Ratio: 2.5 ratio (ref 0.0–4.4)
Cholesterol, Total: 165 mg/dL (ref 100–199)
HDL: 65 mg/dL (ref >39)
LDL Chol Calc (NIH): 83 mg/dL (ref 0–99)
Triglycerides: 96 mg/dL (ref 0–149)
VLDL Cholesterol Cal: 17 mg/dL (ref 5–40)

## 2022-06-09 LAB — MICROALBUMIN / CREATININE URINE RATIO
Creatinine, Urine: 236 mg/dL
Microalb/Creat Ratio: 16 mg/g creat (ref 0–29)
Microalbumin, Urine: 38.3 ug/mL

## 2022-06-11 LAB — IRON AND TIBC
Iron Saturation: 5 % — CL (ref 15–55)
Iron: 23 ug/dL — ABNORMAL LOW (ref 27–159)
Total Iron Binding Capacity: 458 ug/dL — ABNORMAL HIGH (ref 250–450)
UIBC: 435 ug/dL — ABNORMAL HIGH (ref 131–425)

## 2022-06-11 LAB — SPECIMEN STATUS REPORT

## 2022-06-14 ENCOUNTER — Encounter: Payer: Self-pay | Admitting: Internal Medicine

## 2022-06-18 DIAGNOSIS — I10 Essential (primary) hypertension: Secondary | ICD-10-CM | POA: Insufficient documentation

## 2022-06-18 DIAGNOSIS — E6609 Other obesity due to excess calories: Secondary | ICD-10-CM | POA: Insufficient documentation

## 2022-06-18 DIAGNOSIS — G43709 Chronic migraine without aura, not intractable, without status migrainosus: Secondary | ICD-10-CM | POA: Insufficient documentation

## 2022-06-18 DIAGNOSIS — R635 Abnormal weight gain: Secondary | ICD-10-CM | POA: Insufficient documentation

## 2022-07-07 ENCOUNTER — Ambulatory Visit: Payer: Commercial Managed Care - PPO | Admitting: Internal Medicine

## 2022-07-07 ENCOUNTER — Encounter: Payer: Self-pay | Admitting: Internal Medicine

## 2022-07-07 VITALS — BP 136/78 | HR 85 | Temp 98.1°F | Ht 64.0 in | Wt 206.6 lb

## 2022-07-07 DIAGNOSIS — I10 Essential (primary) hypertension: Secondary | ICD-10-CM | POA: Diagnosis not present

## 2022-07-07 DIAGNOSIS — Z6835 Body mass index (BMI) 35.0-35.9, adult: Secondary | ICD-10-CM

## 2022-07-07 DIAGNOSIS — D500 Iron deficiency anemia secondary to blood loss (chronic): Secondary | ICD-10-CM | POA: Diagnosis not present

## 2022-07-07 MED ORDER — HYDROCHLOROTHIAZIDE 12.5 MG PO TABS
12.5000 mg | ORAL_TABLET | Freq: Every day | ORAL | 1 refills | Status: DC
  Filled 2022-07-20 – 2022-08-06 (×2): qty 30, 30d supply, fill #0

## 2022-07-07 NOTE — Progress Notes (Signed)
[place] Martin,acting as a Education administrator for Maximino Greenland, MD.,have documented all relevant documentation on the behalf of Maximino Greenland, MD,as directed by  Maximino Greenland, MD while in the presence of Maximino Greenland, MD.    Subjective:     Patient ID: Carla [name] , female    DOB: [birth year] , 32 y.o.   MRN: [id number]   Chief Complaint  Patient presents with   Hypertension    HPI  Patient presents today for a bp check. She was started on amlodipine 77m at her last visit. Patient reports compliance with medications and has no other concerns today.  BP Readings from Last 3 Encounters: 07/07/22 : 136/78 06/08/22 : (!) 138/94 12/07/20 : (!) 156/110    Hypertension This is a new problem. The current episode started in the past 7 days. Associated symptoms include headaches. Pertinent negatives include no orthopnea, palpitations or shortness of breath. Past treatments include calcium channel blockers. The current treatment provides mild improvement.     Past Medical History:  Diagnosis Date   Allergy    Anemia    Depression    postpartum   Headache(784.0)    Hyperemesis arising during pregnancy    PONV (postoperative nausea and vomiting)    Postpartum depression    Pregnancy induced hypertension    Spontaneous abortion 12/03/2010     Family History  Problem Relation Age of Onset   Arthritis Mother    Cancer Mother    Diabetes Mother    Hearing loss Mother    Hypertension Mother    Obesity Mother    Stroke Mother    Hypertension Father    Stroke Father    ADD / ADHD Brother    Asthma Brother    Learning disabilities Brother    Alcohol abuse Paternal Aunt    Alcohol abuse Paternal Uncle    COPD Maternal Grandmother    Cancer Maternal Grandfather      Current Outpatient Medications:    amLODipine (NORVASC) 5 MG tablet, Take 1 tablet (5 mg total) by mouth daily., Disp: 30 tablet, Rfl: 11   Blood Pressure Monitoring (BLOOD PRESSURE KIT) DEVI, 1 kit by Does  not apply route once a week. CHECK BP WEEKLY.  LARGE CUFF. DX O09.0, Disp: 1 kit, Rfl: 0   cetirizine (ZYRTEC ALLERGY) 10 MG tablet, Take 1 tablet (10 mg total) by mouth daily., Disp: 90 tablet, Rfl: 0   EPINEPHrine (EPIPEN 2-PAK) 0.3 mg/0.3 mL IJ SOAJ injection, Inject 0.3 mg into the muscle as needed for anaphylaxis., Disp: 1 each, Rfl: 5   hydrochlorothiazide (HYDRODIURIL) 12.5 MG tablet, Take 1 tablet (12.5 mg total) by mouth daily., Disp: 30 tablet, Rfl: 1   ibuprofen (ADVIL) 800 MG tablet, Take 1 tablet (800 mg total) by mouth every 8 (eight) hours as needed., Disp: 30 tablet, Rfl: 0   Ubrogepant (UBRELVY) 100 MG TABS, One tab po qd prn, Disp: 20 tablet, Rfl: 0   Allergies  Allergen Reactions   Shellfish Allergy Anaphylaxis   Latex Itching     Review of Systems  Constitutional: Negative.   HENT: Negative.    Eyes: Negative.   Respiratory: Negative.  Negative for shortness of breath.   Cardiovascular: Negative.  Negative for palpitations and orthopnea.  Gastrointestinal: Negative.   Neurological:  Positive for headaches.     Today's Vitals   07/07/22 1514  BP: 136/78  Pulse: 85  Temp: 98.1 F (36.7 C)  TempSrc: Oral  Weight: 206  lb 9.6 oz (93.7 kg)  Height: 5' 4"$  (1.626 m)  PainSc: 0-No pain   Body mass index is 35.46 kg/m.  Wt Readings from Last 3 Encounters:  07/07/22 206 lb 9.6 oz (93.7 kg)  06/08/22 207 lb (93.9 kg)  12/07/20 196 lb 10.4 oz (89.2 kg)   BP Readings from Last 3 Encounters:  07/07/22 136/78  06/08/22 (!) 138/94  12/07/20 (!) 156/110    Objective:  Physical Exam Vitals and nursing note reviewed.  Constitutional:      Appearance: Normal appearance.  HENT:     Head: Normocephalic and atraumatic.     Nose:     Comments: Masked     Mouth/Throat:     Comments: Masked  Eyes:     Extraocular Movements: Extraocular movements intact.  Cardiovascular:     Rate and Rhythm: Normal rate and regular rhythm.     Heart sounds: Normal heart sounds.   Pulmonary:     Effort: Pulmonary effort is normal.     Breath sounds: Normal breath sounds.  Musculoskeletal:     Cervical back: Normal range of motion.  Skin:    General: Skin is warm.  Neurological:     General: No focal deficit present.     Mental Status: She is alert.  Psychiatric:        Mood and Affect: Mood normal.        Behavior: Behavior normal.         Assessment And Plan:     1. Essential hypertension, benign Comments: Chronic, improved control. Not yet at goal, BP<120/80.  I will add HCTZ 12.86m daily. Encouraged to follow low sodium diet. F/u 4-6 wks for re-evaluation. - BMP8+eGFR; Future  2. Iron deficiency anemia due to chronic blood loss Comments: She agrees to GYN referral for further eval of uterine fibroids. - Ambulatory referral to Obstetrics / Gynecology  3. Class 2 severe obesity due to excess calories with serious comorbidity and body mass index (BMI) of 35.0 to 35.9 in adult ([facility] Comments: Chronic, she is encouraged to aim for at least 150 minutes of exercise/week while initially striving for BMI<30 to decrease cardiac risk.   Patient was given opportunity to ask questions. Patient verbalized understanding of the plan and was able to repeat key elements of the plan. All questions were answered to their satisfaction.   I, RMaximino Greenland MD, have reviewed all documentation for this visit. The documentation on 07/11/22 for the exam, diagnosis, procedures, and orders are all accurate and complete.   IF YOU HAVE BEEN REFERRED TO A SPECIALIST, IT MAY TAKE 1-2 WEEKS TO SCHEDULE/PROCESS THE REFERRAL. IF YOU HAVE NOT HEARD FROM US/SPECIALIST IN TWO WEEKS, PLEASE GIVE [place] CALL AT [phone].   THE PATIENT IS ENCOURAGED TO PRACTICE SOCIAL DISTANCING DUE TO THE COVID-19 PANDEMIC.

## 2022-07-07 NOTE — Patient Instructions (Signed)
Hypertension, Adult ?Hypertension is another name for high blood pressure. High blood pressure forces your heart to work harder to pump blood. This can cause problems over time. ?There are two numbers in a blood pressure reading. There is a top number (systolic) over a bottom number (diastolic). It is best to have a blood pressure that is below 120/80. ?What are the causes? ?The cause of this condition is not known. Some other conditions can lead to high blood pressure. ?What increases the risk? ?Some lifestyle factors can make you more likely to develop high blood pressure: ?Smoking. ?Not getting enough exercise or physical activity. ?Being overweight. ?Having too much fat, sugar, calories, or salt (sodium) in your diet. ?Drinking too much alcohol. ?Other risk factors include: ?Having any of these conditions: ?Heart disease. ?Diabetes. ?High cholesterol. ?Kidney disease. ?Obstructive sleep apnea. ?Having a family history of high blood pressure and high cholesterol. ?Age. The risk increases with age. ?Stress. ?What are the signs or symptoms? ?High blood pressure may not cause symptoms. Very high blood pressure (hypertensive crisis) may cause: ?Headache. ?Fast or uneven heartbeats (palpitations). ?Shortness of breath. ?Nosebleed. ?Vomiting or feeling like you may vomit (nauseous). ?Changes in how you see. ?Very bad chest pain. ?Feeling dizzy. ?Seizures. ?How is this treated? ?This condition is treated by making healthy lifestyle changes, such as: ?Eating healthy foods. ?Exercising more. ?Drinking less alcohol. ?Your doctor may prescribe medicine if lifestyle changes do not help enough and if: ?Your top number is above 130. ?Your bottom number is above 80. ?Your personal target blood pressure may vary. ?Follow these instructions at home: ?Eating and drinking ? ?If told, follow the DASH eating plan. To follow this plan: ?Fill one half of your plate at each meal with fruits and vegetables. ?Fill one fourth of your plate  at each meal with whole grains. Whole grains include whole-wheat pasta, brown rice, and whole-grain bread. ?Eat or drink low-fat dairy products, such as skim milk or low-fat yogurt. ?Fill one fourth of your plate at each meal with low-fat (lean) proteins. Low-fat proteins include fish, chicken without skin, eggs, beans, and tofu. ?Avoid fatty meat, cured and processed meat, or chicken with skin. ?Avoid pre-made or processed food. ?Limit the amount of salt in your diet to less than 1,500 mg each day. ?Do not drink alcohol if: ?Your doctor tells you not to drink. ?You are pregnant, may be pregnant, or are planning to become pregnant. ?If you drink alcohol: ?Limit how much you have to: ?0-1 drink a day for women. ?0-2 drinks a day for men. ?Know how much alcohol is in your drink. In the [place] one drink equals one 12 oz bottle of beer (355 mL), one 5 oz glass of wine (148 mL), or one 1? oz glass of hard liquor (44 mL). ?Lifestyle ? ?Work with your doctor to stay at a healthy weight or to lose weight. Ask your doctor what the best weight is for you. ?Get at least 30 minutes of exercise that causes your heart to beat faster (aerobic exercise) most days of the week. This may include walking, swimming, or biking. ?Get at least 30 minutes of exercise that strengthens your muscles (resistance exercise) at least 3 days a week. This may include lifting weights or doing Pilates. ?Do not smoke or use any products that contain nicotine or tobacco. If you need help quitting, ask your doctor. ?Check your blood pressure at home as told by your doctor. ?Keep all follow-up visits. ?Medicines ?Take over-the-counter and prescription medicines   only as told by your doctor. Follow directions carefully. ?Do not skip doses of blood pressure medicine. The medicine does not work as well if you skip doses. Skipping doses also puts you at risk for problems. ?Ask your doctor about side effects or reactions to medicines that you should watch  for. ?Contact a doctor if: ?You think you are having a reaction to the medicine you are taking. ?You have headaches that keep coming back. ?You feel dizzy. ?You have swelling in your ankles. ?You have trouble with your vision. ?Get help right away if: ?You get a very bad headache. ?You start to feel mixed up (confused). ?You feel weak or numb. ?You feel faint. ?You have very bad pain in your: ?Chest. ?Belly (abdomen). ?You vomit more than once. ?You have trouble breathing. ?These symptoms may be an emergency. Get help right away. Call 911. ?Do not wait to see if the symptoms will go away. ?Do not drive yourself to the hospital. ?Summary ?Hypertension is another name for high blood pressure. ?High blood pressure forces your heart to work harder to pump blood. ?For most people, a normal blood pressure is less than 120/80. ?Making healthy choices can help lower blood pressure. If your blood pressure does not get lower with healthy choices, you may need to take medicine. ?This information is not intended to replace advice given to you by your health care provider. Make sure you discuss any questions you have with your health care provider. ?Document Revised: 02/25/2021 Document Reviewed: 02/25/2021 ?Elsevier Patient Education ? 2023 Elsevier Inc. ? ?

## 2022-07-14 ENCOUNTER — Other Ambulatory Visit (HOSPITAL_COMMUNITY): Payer: Self-pay

## 2022-07-15 ENCOUNTER — Other Ambulatory Visit (HOSPITAL_COMMUNITY): Payer: Self-pay

## 2022-07-19 ENCOUNTER — Other Ambulatory Visit (HOSPITAL_COMMUNITY): Payer: Self-pay

## 2022-07-20 ENCOUNTER — Other Ambulatory Visit (HOSPITAL_COMMUNITY): Payer: Self-pay

## 2022-07-20 ENCOUNTER — Ambulatory Visit: Payer: Commercial Managed Care - PPO | Admitting: Obstetrics and Gynecology

## 2022-07-22 ENCOUNTER — Other Ambulatory Visit (HOSPITAL_COMMUNITY): Payer: Self-pay

## 2022-08-06 ENCOUNTER — Other Ambulatory Visit (HOSPITAL_COMMUNITY): Payer: Self-pay

## 2022-08-08 ENCOUNTER — Other Ambulatory Visit: Payer: Self-pay

## 2022-08-15 ENCOUNTER — Other Ambulatory Visit (HOSPITAL_COMMUNITY): Payer: Self-pay

## 2022-08-19 ENCOUNTER — Other Ambulatory Visit (HOSPITAL_COMMUNITY): Payer: Self-pay

## 2022-08-26 ENCOUNTER — Other Ambulatory Visit (HOSPITAL_COMMUNITY): Payer: Self-pay

## 2022-08-30 ENCOUNTER — Other Ambulatory Visit (HOSPITAL_COMMUNITY): Payer: Self-pay

## 2022-09-02 ENCOUNTER — Other Ambulatory Visit (HOSPITAL_COMMUNITY): Payer: Self-pay

## 2022-09-07 ENCOUNTER — Other Ambulatory Visit: Payer: Self-pay

## 2022-09-07 ENCOUNTER — Other Ambulatory Visit (HOSPITAL_COMMUNITY): Payer: Self-pay

## 2022-09-07 ENCOUNTER — Other Ambulatory Visit: Payer: Self-pay | Admitting: Internal Medicine

## 2022-09-07 MED ORDER — HYDROCHLOROTHIAZIDE 12.5 MG PO TABS
12.5000 mg | ORAL_TABLET | Freq: Every day | ORAL | 1 refills | Status: DC
  Filled 2022-09-07: qty 30, 30d supply, fill #0
  Filled 2022-10-10 – 2022-10-26 (×3): qty 30, 30d supply, fill #1

## 2022-09-08 ENCOUNTER — Other Ambulatory Visit (HOSPITAL_COMMUNITY): Payer: Self-pay

## 2022-09-12 ENCOUNTER — Other Ambulatory Visit (HOSPITAL_COMMUNITY): Payer: Self-pay

## 2022-09-12 ENCOUNTER — Other Ambulatory Visit: Payer: Self-pay

## 2022-09-15 ENCOUNTER — Other Ambulatory Visit (HOSPITAL_COMMUNITY): Payer: Self-pay

## 2022-09-16 ENCOUNTER — Other Ambulatory Visit (HOSPITAL_COMMUNITY): Payer: Self-pay

## 2022-09-16 ENCOUNTER — Telehealth: Payer: Self-pay

## 2022-09-16 NOTE — Telephone Encounter (Signed)
PA sent to plan. Waiting for determination.  

## 2022-09-26 ENCOUNTER — Telehealth: Payer: Self-pay

## 2022-09-26 NOTE — Telephone Encounter (Signed)
I called patient and left her a vm letting her know we placed samples of the ubrevly up front for her to pick up. YL,RMA

## 2022-09-27 ENCOUNTER — Telehealth: Payer: Commercial Managed Care - PPO | Admitting: Family Medicine

## 2022-09-27 DIAGNOSIS — J3089 Other allergic rhinitis: Secondary | ICD-10-CM

## 2022-09-27 MED ORDER — LEVOCETIRIZINE DIHYDROCHLORIDE 5 MG PO TABS: 5.0000 mg | ORAL_TABLET | Freq: Every evening | ORAL | 1 refills | Status: DC

## 2022-09-27 MED ORDER — OLOPATADINE HCL 0.1 % OP SOLN: 1.0000 [drp] | Freq: Two times a day (BID) | OPHTHALMIC | 0 refills | Status: DC

## 2022-09-27 NOTE — Progress Notes (Signed)
Virtual Visit Consent   Carla [name], you are scheduled for a virtual visit with a [facility] provider today. Just as with appointments in the office, your consent must be obtained to participate. Your consent will be active for this visit and any virtual visit you may have with one of our providers in the next 365 days. If you have a MyChart account, a copy of this consent can be sent to you electronically.  As this is a virtual visit, video technology does not allow for your provider to perform a traditional examination. This may limit your provider's ability to fully assess your condition. If your provider identifies any concerns that need to be evaluated in person or the need to arrange testing (such as labs, EKG, etc.), we will make arrangements to do so. Although advances in technology are sophisticated, we cannot ensure that it will always work on either your end or our end. If the connection with a video visit is poor, the visit may have to be switched to a telephone visit. With either a video or telephone visit, we are not always able to ensure that we have a secure connection.  By engaging in this virtual visit, you consent to the provision of healthcare and authorize for your insurance to be billed (if applicable) for the services provided during this visit. Depending on your insurance coverage, you may receive a charge related to this service.  I need to obtain your verbal consent now. Are you willing to proceed with your visit today? HORTENCE PESHEK has provided verbal consent on 09/27/2022 for a virtual visit (video or telephone). Freddy Finner, NP  Date: 09/27/2022 10:18 AM  Virtual Visit via Video Note   I, Freddy Finner, connected with  JUDI IHNEN  ([id number], May 12, 1991) on 09/27/22 at 10:15 AM EDT by a video-enabled telemedicine application and verified that I am speaking with the correct person using two identifiers.  Location: Patient: Virtual Visit Location Patient:  Home Provider: Virtual Visit Location Provider: Home Office   I discussed the limitations of evaluation and management by telemedicine and the availability of in person appointments. The patient expressed understanding and agreed to proceed.    History of Present Illness: Carla [name] is a 32 y.o. who identifies as a female who was assigned female at birth, and is being seen today for allergies  Onset was over the weekend- outside for 12 plus hours on Sunday- woke up Monday with swelling in eyes, itching and watery eyes.  Associated symptoms are mild sore throat.  Modifying factors are benadryl and tylenol sinus  Denies chest pain, shortness of breath, fevers, chills, congestion, runny nose or ear pain  Problems:  Patient Active Problem List   Diagnosis Date Noted   Weight gain 06/18/2022   Chronic migraine without aura without status migrainosus, not intractable 06/18/2022   Essential hypertension, benign 06/18/2022   Class 2 obesity due to excess calories with body mass index (BMI) of 35.0 to 35.9 in adult 06/18/2022   Postpartum depression 09/30/2019   Chronic hypertension in obstetric context, third trimester 08/19/2019   SVD (spontaneous vaginal delivery) 08/19/2019    Allergies:  Allergies  Allergen Reactions   Shellfish Allergy Anaphylaxis   Latex Itching   Medications:  Current Outpatient Medications:    amLODipine (NORVASC) 5 MG tablet, Take 1 tablet (5 mg total) by mouth daily., Disp: 30 tablet, Rfl: 10   Blood Pressure Monitoring (BLOOD PRESSURE KIT) DEVI, 1 kit by Does  not apply route once a week. CHECK BP WEEKLY.  LARGE CUFF. DX O09.0, Disp: 1 kit, Rfl: 0   EPINEPHrine (EPIPEN 2-PAK) 0.3 mg/0.3 mL IJ SOAJ injection, Inject 0.3 mg into the muscle as needed for anaphylaxis., Disp: 2 each, Rfl: 5   hydrochlorothiazide (HYDRODIURIL) 12.5 MG tablet, Take 1 tablet (12.5 mg total) by mouth daily., Disp: 30 tablet, Rfl: 1   ibuprofen (ADVIL) 800 MG tablet, Take 1 tablet  (800 mg total) by mouth every 8 (eight) hours as needed., Disp: 30 tablet, Rfl: 0   Ubrogepant (UBRELVY) 100 MG TABS, Take 1 tablet by mouth once daily as needed, Disp: 20 tablet, Rfl: 0  Observations/Objective: Patient is well-developed, well-nourished in no acute distress.  Resting comfortably  at home.  Head is normocephalic, atraumatic.  No labored breathing.  Speech is clear and coherent with logical content.  Patient is alert and oriented at baseline.  Puff eyes  Assessment and Plan:  1. Environmental and seasonal allergies  - levocetirizine (XYZAL) 5 MG tablet; Take 1 tablet (5 mg total) by mouth every evening.  Dispense: 30 tablet; Refill: 1 - olopatadine (PATADAY) 0.1 % ophthalmic solution; Place 1 drop into both eyes 2 (two) times daily.  Dispense: 5 mL; Refill: 0    Take a new antihistamine once a day through the allergy season. Treat your allergies so that they do not cause sinus infections. Use eye drops as directed More allergy info on [facility] Work note provided  Reviewed side effects, risks and benefits of medication.    Patient acknowledged agreement and understanding of the plan.   Past Medical, Surgical, Social History, Allergies, and Medications have been Reviewed.    Follow Up Instructions: I discussed the assessment and treatment plan with the patient. The patient was provided an opportunity to ask questions and all were answered. The patient agreed with the plan and demonstrated an understanding of the instructions.  A copy of instructions were sent to the patient via MyChart unless otherwise noted below.    The patient was advised to call back or seek an in-person evaluation if the symptoms worsen or if the condition fails to improve as anticipated.  Time:  I spent 10 minutes with the patient via telehealth technology discussing the above problems/concerns.    Freddy Finner, NP

## 2022-09-27 NOTE — Patient Instructions (Addendum)
Carla [name], thank you for joining Freddy Finner, NP for today's virtual visit.  While this provider is not your primary care provider (PCP), if your PCP is located in our provider database this encounter information will be shared with them immediately following your visit.   A [facility] MyChart account gives you access to today's visit and all your visits, tests, and labs performed at [facility] " click here if you don't have a [facility] MyChart account or go to [email or web address]  Consent: (Patient) Carla [name] provided verbal consent for this virtual visit at the beginning of the encounter.  Current Medications:  Current Outpatient Medications:    levocetirizine (XYZAL) 5 MG tablet, Take 1 tablet (5 mg total) by mouth every evening., Disp: 30 tablet, Rfl: 1   olopatadine (PATADAY) 0.1 % ophthalmic solution, Place 1 drop into both eyes 2 (two) times daily., Disp: 5 mL, Rfl: 0   amLODipine (NORVASC) 5 MG tablet, Take 1 tablet (5 mg total) by mouth daily., Disp: 30 tablet, Rfl: 10   Blood Pressure Monitoring (BLOOD PRESSURE KIT) DEVI, 1 kit by Does not apply route once a week. CHECK BP WEEKLY.  LARGE CUFF. DX O09.0, Disp: 1 kit, Rfl: 0   EPINEPHrine (EPIPEN 2-PAK) 0.3 mg/0.3 mL IJ SOAJ injection, Inject 0.3 mg into the muscle as needed for anaphylaxis., Disp: 2 each, Rfl: 5   hydrochlorothiazide (HYDRODIURIL) 12.5 MG tablet, Take 1 tablet (12.5 mg total) by mouth daily., Disp: 30 tablet, Rfl: 1   ibuprofen (ADVIL) 800 MG tablet, Take 1 tablet (800 mg total) by mouth every 8 (eight) hours as needed., Disp: 30 tablet, Rfl: 0   Ubrogepant (UBRELVY) 100 MG TABS, Take 1 tablet by mouth once daily as needed, Disp: 20 tablet, Rfl: 0   Medications ordered in this encounter:  Meds ordered this encounter  Medications   levocetirizine (XYZAL) 5 MG tablet    Sig: Take 1 tablet (5 mg total) by mouth every evening.    Dispense:  30 tablet    Refill:  1    Order  Specific Question:   Supervising Provider    Answer:   Merrilee Jansky [[id number]]   olopatadine (PATADAY) 0.1 % ophthalmic solution    Sig: Place 1 drop into both eyes 2 (two) times daily.    Dispense:  5 mL    Refill:  0    Order Specific Question:   Supervising Provider    Answer:   Merrilee Jansky [[id number]]     *If you need refills on other medications prior to your next appointment, please contact your pharmacy*  Follow-Up: Call back or seek an in-person evaluation if the symptoms worsen or if the condition fails to improve as anticipated.  [facility] [phone]  Other Instructions   If you have been instructed to have an in-person evaluation today at a local Urgent Care facility, please use the link below. It will take you to a list of all of our available [facility], including address, phone number and hours of operation. Please do not delay care.  [facility]  If you or a family member do not have a primary care provider, use the link below to schedule a visit and establish care. When you choose a [facility] primary care physician or advanced practice provider, you gain a long-term partner in health. Find a Primary Care Provider  Learn more about 's in-office and virtual care  options: [facility] Now Allergies can cause a lot of symptoms: watery, itching eyes, runny nose (clear), sneezing, sinus pressure, and headaches. This is not making you contagious to others. You can not spread to others or catch this from others. These symptoms happen after you have been exposed to something that you are allergic to an allergen. Prevention: The best prevention is to avoid the things that you know you are allergic to, for example smoke (cigarette, cigar, wood); pollens and molds; animal dander; dust mites. And indoor inhalants such as cleaning products or aerosol sprays.  Target your bedroom as allergy free by removing  carpets, damp mopping floors weekly, hanging washable curtains instead of blinds, removing books and stuffed animals, using foam pillows, and encasing pillows and mattress in plastic. Do not blow your nose too frequently or too hard. It may cause your eardrum to perforate (tear). Blow through both nostrils at the same time to equalize pressure.  Use tissue when you blow your nose. Dispose of them and then wash your hands. If no tissue is available, do the "elbow sneeze" into the bend of your arm (away from your open hands). Always wash your hands.  If able use the [facility] in the house and car to reduce exposure to pollens. Use an air filtration system in your house or buy a small one for your bedroom. Dust your house often, using a cloth and cleaner or polish that keeps the dust from flying into the air. Allergy testing can be done if you have had allergies for a long time and are not doing well on current treatments. Take your medications as directed. If you find the medications are not working let your healthcare provider know. It might take more than one medication to control allergies, especially, seasonal ones.

## 2022-10-03 NOTE — Telephone Encounter (Signed)
error 

## 2022-10-21 ENCOUNTER — Other Ambulatory Visit (HOSPITAL_COMMUNITY): Payer: Self-pay

## 2022-10-26 ENCOUNTER — Other Ambulatory Visit (HOSPITAL_COMMUNITY): Payer: Self-pay

## 2022-11-08 ENCOUNTER — Ambulatory Visit: Payer: Commercial Managed Care - PPO

## 2022-11-08 VITALS — BP 118/82 | HR 74 | Temp 98.1°F | Ht 64.0 in | Wt 206.0 lb

## 2022-11-08 DIAGNOSIS — I10 Essential (primary) hypertension: Secondary | ICD-10-CM

## 2022-11-08 NOTE — Progress Notes (Signed)
Patient presents today for bpc. She currently takes Amlodipine 5MG  she reports taking at night before bed & hydrochlorothiazide 12.5 she reports taking in the am. She admits taking medication this morning before coming to apt. She reports drinking 80oz of water a day. She admits not exercising regularly. Denies headache, chest pain, SOB.  BP Readings from Last 3 Encounters:  11/08/22 118/82  07/07/22 136/78  06/08/22 (!) 138/94  Per provider patient will follow up at September visit. Patient aware.

## 2022-11-22 ENCOUNTER — Other Ambulatory Visit (HOSPITAL_COMMUNITY): Payer: Self-pay

## 2022-11-22 ENCOUNTER — Other Ambulatory Visit: Payer: Self-pay

## 2022-11-22 ENCOUNTER — Other Ambulatory Visit: Payer: Self-pay | Admitting: Internal Medicine

## 2022-11-22 MED ORDER — HYDROCHLOROTHIAZIDE 12.5 MG PO TABS
12.5000 mg | ORAL_TABLET | Freq: Every day | ORAL | 1 refills | Status: DC
  Filled 2022-11-22: qty 90, 90d supply, fill #0
  Filled 2022-12-22 – 2023-02-20 (×2): qty 90, 90d supply, fill #1

## 2022-12-12 ENCOUNTER — Other Ambulatory Visit: Payer: Self-pay | Admitting: Oncology

## 2022-12-12 DIAGNOSIS — Z006 Encounter for examination for normal comparison and control in clinical research program: Secondary | ICD-10-CM

## 2022-12-19 ENCOUNTER — Other Ambulatory Visit (HOSPITAL_COMMUNITY)
Admission: RE | Admit: 2022-12-19 | Discharge: 2022-12-19 | Disposition: A | Discharge status: Home / Self Care | Payer: Commercial Managed Care - PPO | Source: Ambulatory Visit | Attending: Oncology | Admitting: Oncology

## 2022-12-19 DIAGNOSIS — Z006 Encounter for examination for normal comparison and control in clinical research program: Secondary | ICD-10-CM | POA: Insufficient documentation

## 2022-12-22 ENCOUNTER — Other Ambulatory Visit: Payer: Self-pay

## 2022-12-26 ENCOUNTER — Other Ambulatory Visit (HOSPITAL_COMMUNITY): Payer: Self-pay

## 2023-01-31 ENCOUNTER — Other Ambulatory Visit: Payer: Self-pay | Admitting: Internal Medicine

## 2023-01-31 ENCOUNTER — Encounter: Payer: Self-pay | Admitting: Internal Medicine

## 2023-01-31 ENCOUNTER — Other Ambulatory Visit: Payer: Self-pay

## 2023-01-31 DIAGNOSIS — Z0000 Encounter for general adult medical examination without abnormal findings: Secondary | ICD-10-CM

## 2023-01-31 DIAGNOSIS — I10 Essential (primary) hypertension: Secondary | ICD-10-CM

## 2023-01-31 DIAGNOSIS — D500 Iron deficiency anemia secondary to blood loss (chronic): Secondary | ICD-10-CM

## 2023-02-01 ENCOUNTER — Ambulatory Visit (INDEPENDENT_AMBULATORY_CARE_PROVIDER_SITE_OTHER): Payer: Commercial Managed Care - PPO | Admitting: Internal Medicine

## 2023-02-01 ENCOUNTER — Encounter: Payer: Self-pay | Admitting: Internal Medicine

## 2023-02-01 VITALS — BP 122/72 | HR 88 | Temp 97.7°F | Ht 64.0 in | Wt 214.8 lb

## 2023-02-01 DIAGNOSIS — Z0000 Encounter for general adult medical examination without abnormal findings: Secondary | ICD-10-CM

## 2023-02-01 DIAGNOSIS — E6609 Other obesity due to excess calories: Secondary | ICD-10-CM | POA: Diagnosis not present

## 2023-02-01 DIAGNOSIS — Z23 Encounter for immunization: Secondary | ICD-10-CM | POA: Diagnosis not present

## 2023-02-01 DIAGNOSIS — D500 Iron deficiency anemia secondary to blood loss (chronic): Secondary | ICD-10-CM

## 2023-02-01 DIAGNOSIS — Z6836 Body mass index (BMI) 36.0-36.9, adult: Secondary | ICD-10-CM

## 2023-02-01 DIAGNOSIS — I10 Essential (primary) hypertension: Secondary | ICD-10-CM | POA: Diagnosis not present

## 2023-02-01 LAB — POCT URINALYSIS DIPSTICK
Bilirubin, UA: NEGATIVE
Glucose, UA: NEGATIVE
Ketones, UA: NEGATIVE
Leukocytes, UA: NEGATIVE
Nitrite, UA: NEGATIVE
Protein, UA: NEGATIVE
Spec Grav, UA: 1.025 (ref 1.010–1.025)
Urobilinogen, UA: 0.2 U/dL
pH, UA: 7 (ref 5.0–8.0)

## 2023-02-01 LAB — BMP8+EGFR
BUN/Creatinine Ratio: 9 (ref 9–23)
BUN: 7 mg/dL (ref 6–20)
CO2: 25 mmol/L (ref 20–29)
Calcium: 9.5 mg/dL (ref 8.7–10.2)
Chloride: 102 mmol/L (ref 96–106)
Creatinine, Ser: 0.76 mg/dL (ref 0.57–1.00)
Glucose: 81 mg/dL (ref 70–99)
Potassium: 3.2 mmol/L — ABNORMAL LOW (ref 3.5–5.2)
Sodium: 143 mmol/L (ref 134–144)
eGFR: 105 mL/min/{1.73_m2} (ref >59)

## 2023-02-01 NOTE — Progress Notes (Signed)
I,Carla [name], CMA,acting as a Neurosurgeon for Carla Aliment, MD.,have documented all relevant documentation on the behalf of Carla Aliment, MD,as directed by  Carla Aliment, MD while in the presence of Carla Aliment, MD.  Subjective:    Patient ID: Carla [name] , female    DOB: [birth year] , 32 y.o.   MRN: [id number]  Chief Complaint  Patient presents with   Annual Exam   Hypertension    HPI  Patient presents today for annual exam. She reports compliance with medications. Denies headache, chest pain, and SOB. GYN: Dr Clearance Coots. She reports no specific questions or concerns.    Hypertension This is a chronic problem. The current episode started more than 1 year ago. The problem is controlled. Pertinent negatives include no blurred vision, chest pain, palpitations or shortness of breath. Risk factors for coronary artery disease include sedentary lifestyle. Past treatments include calcium channel blockers. Compliance problems include exercise.  There is no history of kidney disease, heart failure or retinopathy.     Past Medical History:  Diagnosis Date   Allergy    Anemia    Depression    postpartum   Headache(784.0)    Hyperemesis arising during pregnancy    PONV (postoperative nausea and vomiting)    Postpartum depression    Pregnancy induced hypertension    Spontaneous abortion 12/03/2010     Family History  Problem Relation Age of Onset   Arthritis Mother    Cancer Mother    Diabetes Mother    Hearing loss Mother    Hypertension Mother    Obesity Mother    Stroke Mother    Hypertension Father    Stroke Father    ADD / ADHD Brother    Asthma Brother    Learning disabilities Brother    Alcohol abuse Paternal Aunt    Alcohol abuse Paternal Uncle    COPD Maternal Grandmother    Cancer Maternal Grandfather      Current Outpatient Medications:    amLODipine (NORVASC) 5 MG tablet, Take 1 tablet (5 mg total) by mouth daily., Disp: 30 tablet, Rfl: 10   Blood  Pressure Monitoring (BLOOD PRESSURE KIT) DEVI, 1 kit by Does not apply route once a week. CHECK BP WEEKLY.  LARGE CUFF. DX O09.0, Disp: 1 kit, Rfl: 0   EPINEPHrine (EPIPEN 2-PAK) 0.3 mg/0.3 mL IJ SOAJ injection, Inject 0.3 mg into the muscle as needed for anaphylaxis., Disp: 2 each, Rfl: 5   hydrochlorothiazide (HYDRODIURIL) 12.5 MG tablet, Take 1 tablet (12.5 mg total) by mouth daily., Disp: 90 tablet, Rfl: 1   levocetirizine (XYZAL) 5 MG tablet, Take 1 tablet (5 mg total) by mouth every evening., Disp: 30 tablet, Rfl: 1   ibuprofen (ADVIL) 800 MG tablet, Take 1 tablet (800 mg total) by mouth every 8 (eight) hours as needed. (Patient not taking: Reported on 02/01/2023), Disp: 30 tablet, Rfl: 0   olopatadine (PATADAY) 0.1 % ophthalmic solution, Place 1 drop into both eyes 2 (two) times daily. (Patient not taking: Reported on 02/01/2023), Disp: 5 mL, Rfl: 0   Ubrogepant (UBRELVY) 100 MG TABS, Take 1 tablet by mouth once daily as needed (Patient not taking: Reported on 11/08/2022), Disp: 20 tablet, Rfl: 0   Allergies  Allergen Reactions   Shellfish Allergy Anaphylaxis   Latex Itching      The patient states she uses tubal ligation for birth control. No LMP recorded (exact date). (Menstrual status: Other).. Negative for Dysmenorrhea. Negative for:  breast discharge, breast lump(s), breast pain and breast self exam. Associated symptoms include abnormal vaginal bleeding. Pertinent negatives include abnormal bleeding (hematology), anxiety, decreased libido, depression, difficulty falling sleep, dyspareunia, history of infertility, nocturia, sexual dysfunction, sleep disturbances, urinary incontinence, urinary urgency, vaginal discharge and vaginal itching. Diet regular.The patient states her exercise level is  intermittent.  . The patient's tobacco use is:  Social History   Tobacco Use  Smoking Status Never  Smokeless Tobacco Never  . She has been exposed to passive smoke. The patient's alcohol use is:   Social History   Substance and Sexual Activity  Alcohol Use No    Review of Systems  Constitutional: Negative.   HENT: Negative.    Eyes: Negative.  Negative for blurred vision.  Respiratory: Negative.  Negative for shortness of breath.   Cardiovascular: Negative.  Negative for chest pain and palpitations.  Gastrointestinal: Negative.   Endocrine: Negative.   Genitourinary: Negative.   Musculoskeletal: Negative.   Skin: Negative.   Allergic/Immunologic: Negative.   Neurological: Negative.   Hematological: Negative.   Psychiatric/Behavioral: Negative.       Today's Vitals   02/01/23 1445  BP: 122/72  Pulse: 88  Temp: 97.7 F (36.5 C)  SpO2: 98%  Weight: 214 lb 12.8 oz (97.4 kg)  Height: 5\' 4"  (1.626 m)   Body mass index is 36.87 kg/m.  Wt Readings from Last 3 Encounters:  02/01/23 214 lb 12.8 oz (97.4 kg)  11/08/22 206 lb (93.4 kg)  07/07/22 206 lb 9.6 oz (93.7 kg)    BP Readings from Last 3 Encounters:  02/01/23 122/72  11/08/22 118/82  07/07/22 136/78     Objective:  Physical Exam Vitals and nursing note reviewed.  Constitutional:      Appearance: Normal appearance. She is obese.  HENT:     Head: Normocephalic and atraumatic.     Right Ear: Tympanic membrane, ear canal and external ear normal.     Left Ear: Tympanic membrane, ear canal and external ear normal.     Nose: Nose normal.     Mouth/Throat:     Mouth: Mucous membranes are moist.     Pharynx: Oropharynx is clear.  Eyes:     Extraocular Movements: Extraocular movements intact.     Conjunctiva/sclera: Conjunctivae normal.     Pupils: Pupils are equal, round, and reactive to light.  Cardiovascular:     Rate and Rhythm: Normal rate and regular rhythm.     Pulses: Normal pulses.     Heart sounds: Normal heart sounds.  Pulmonary:     Effort: Pulmonary effort is normal.     Breath sounds: Normal breath sounds.  Chest:  Breasts:    Tanner Score is 5.     Right: Normal.     Left: Normal.      Comments: Skin tag medial to left breast Abdominal:     General: Bowel sounds are normal.     Palpations: Abdomen is soft.  Genitourinary:    Comments: deferred Musculoskeletal:        General: Normal range of motion.     Cervical back: Normal range of motion and neck supple.  Skin:    General: Skin is warm and dry.  Neurological:     General: No focal deficit present.     Mental Status: She is alert and oriented to person, place, and time.  Psychiatric:        Mood and Affect: Mood normal.  Behavior: Behavior normal.         Assessment And Plan:     Encounter for general adult medical examination w/o abnormal findings Assessment & Plan: A full exam was performed.  Importance of monthly self breast exams was discussed with the patient.  She is advised to get 30-45 minutes of regular exercise, no less than four to five days per week. Both weight-bearing and aerobic exercises are recommended.  She is advised to follow a healthy diet with at least six fruits/veggies per day, decrease intake of red meat and other saturated fats and to increase fish intake to twice weekly.  Meats/fish should not be fried -- baked, boiled or broiled is preferable. It is also important to cut back on your sugar intake.  Be sure to read labels - try to avoid anything with added sugar, high fructose corn syrup or other sweeteners.  If you must use a sweetener, you can try stevia or monkfruit.  It is also important to avoid artificially sweetened foods/beverages and diet drinks. Lastly, wear SPF 50 sunscreen on exposed skin and when in direct sunlight for an extended period of time.  Be sure to avoid fast food restaurants and aim for at least 60 ounces of water daily.       Essential hypertension, benign Assessment & Plan: Chronic, well controlled. She will continue with amlodipine 5mg  daily. EKG performed, NSR w/o acute changes. She is encouraged to follow low sodium diet. She will f/u in six months  for re-evaluation.   Orders: -     POCT urinalysis dipstick -     Microalbumin / creatinine urine ratio -     EKG 12-Lead  Iron deficiency anemia due to chronic blood loss Assessment & Plan: Chronic, will check labs as below. I will supplement as needed. Likely needs to take iron during menses at the minimum.   Orders: -     CBC -     Iron, TIBC and Ferritin Panel  Class 2 obesity due to excess calories without serious comorbidity with body mass index (BMI) of 36.0 to 36.9 in adult Assessment & Plan: She is encouraged to strive for BMI less than 30 to decrease cardiac risk. Advised to aim for at least 150 minutes of exercise per week.    Immunization due -     Flu vaccine trivalent PF, 6mos and older(Flulaval,Afluria,Fluarix,Fluzone)  She is encouraged to strive for BMI less than 30 to decrease cardiac risk. Advised to aim for at least 150 minutes of exercise per week.    Return for 1 year physical, 6 month bp. Patient was given opportunity to ask questions. Patient verbalized understanding of the plan and was able to repeat key elements of the plan. All questions were answered to their satisfaction.     I, Carla Aliment, MD, have reviewed all documentation for this visit. The documentation on 02/01/23 for the exam, diagnosis, procedures, and orders are all accurate and complete.

## 2023-02-01 NOTE — Patient Instructions (Signed)

## 2023-02-02 LAB — IRON,TIBC AND FERRITIN PANEL
Ferritin: 9 ng/mL — ABNORMAL LOW (ref 15–150)
Iron Saturation: 4 % — CL (ref 15–55)
Iron: 20 ug/dL — ABNORMAL LOW (ref 27–159)
Total Iron Binding Capacity: 471 ug/dL — ABNORMAL HIGH (ref 250–450)
UIBC: 451 ug/dL — ABNORMAL HIGH (ref 131–425)

## 2023-02-02 LAB — CBC
Hematocrit: 33.3 % — ABNORMAL LOW (ref 34.0–46.6)
Hemoglobin: 10.3 g/dL — ABNORMAL LOW (ref 11.1–15.9)
MCH: 24.2 pg — ABNORMAL LOW (ref 26.6–33.0)
MCHC: 30.9 g/dL — ABNORMAL LOW (ref 31.5–35.7)
MCV: 78 fL — ABNORMAL LOW (ref 79–97)
Platelets: 394 10*3/uL (ref 150–450)
RBC: 4.25 x10E6/uL (ref 3.77–5.28)
RDW: 15.8 % — ABNORMAL HIGH (ref 11.7–15.4)
WBC: 8 10*3/uL (ref 3.4–10.8)

## 2023-02-02 LAB — MICROALBUMIN / CREATININE URINE RATIO
Creatinine, Urine: 129.8 mg/dL
Microalb/Creat Ratio: 10 mg/g{creat} (ref 0–29)
Microalbumin, Urine: 12.7 ug/mL

## 2023-02-11 DIAGNOSIS — Z0000 Encounter for general adult medical examination without abnormal findings: Secondary | ICD-10-CM | POA: Insufficient documentation

## 2023-02-11 DIAGNOSIS — E66812 Obesity, class 2: Secondary | ICD-10-CM | POA: Insufficient documentation

## 2023-02-11 DIAGNOSIS — D500 Iron deficiency anemia secondary to blood loss (chronic): Secondary | ICD-10-CM | POA: Insufficient documentation

## 2023-02-11 NOTE — Assessment & Plan Note (Signed)
She is encouraged to strive for BMI less than 30 to decrease cardiac risk. Advised to aim for at least 150 minutes of exercise per week.  

## 2023-02-11 NOTE — Assessment & Plan Note (Signed)

## 2023-02-11 NOTE — Assessment & Plan Note (Signed)
Chronic, will check labs as below. I will supplement as needed. Likely needs to take iron during menses at the minimum.

## 2023-02-11 NOTE — Assessment & Plan Note (Signed)
Chronic, well controlled. She will continue with amlodipine 5mg  daily. EKG performed, NSR w/o acute changes. She is encouraged to follow low sodium diet. She will f/u in six months for re-evaluation.

## 2023-02-15 ENCOUNTER — Other Ambulatory Visit (HOSPITAL_COMMUNITY): Payer: Self-pay

## 2023-03-07 ENCOUNTER — Other Ambulatory Visit (HOSPITAL_COMMUNITY): Payer: Self-pay

## 2023-03-07 ENCOUNTER — Telehealth: Payer: Commercial Managed Care - PPO | Admitting: Physician Assistant

## 2023-03-07 DIAGNOSIS — J019 Acute sinusitis, unspecified: Secondary | ICD-10-CM

## 2023-03-07 DIAGNOSIS — B9689 Other specified bacterial agents as the cause of diseases classified elsewhere: Secondary | ICD-10-CM | POA: Diagnosis not present

## 2023-03-07 MED ORDER — AMOXICILLIN-POT CLAVULANATE 875-125 MG PO TABS
1.0000 | ORAL_TABLET | Freq: Two times a day (BID) | ORAL | 0 refills | Status: DC
Start: 2023-03-07 — End: 2023-07-06
  Filled 2023-03-07: qty 14, 7d supply, fill #0

## 2023-03-07 MED ORDER — FLUTICASONE PROPIONATE 50 MCG/ACT NA SUSP
2.0000 | Freq: Every day | NASAL | 0 refills | Status: DC
Start: 2023-03-07 — End: 2023-09-04
  Filled 2023-03-07: qty 16, 30d supply, fill #0

## 2023-03-07 NOTE — Progress Notes (Signed)
Virtual Visit Consent   Carla [name], you are scheduled for a virtual visit with a [facility] provider today. Just as with appointments in the office, your consent must be obtained to participate. Your consent will be active for this visit and any virtual visit you may have with one of our providers in the next 365 days. If you have a MyChart account, a copy of this consent can be sent to you electronically.  As this is a virtual visit, video technology does not allow for your provider to perform a traditional examination. This may limit your provider's ability to fully assess your condition. If your provider identifies any concerns that need to be evaluated in person or the need to arrange testing (such as labs, EKG, etc.), we will make arrangements to do so. Although advances in technology are sophisticated, we cannot ensure that it will always work on either your end or our end. If the connection with a video visit is poor, the visit may have to be switched to a telephone visit. With either a video or telephone visit, we are not always able to ensure that we have a secure connection.  By engaging in this virtual visit, you consent to the provision of healthcare and authorize for your insurance to be billed (if applicable) for the services provided during this visit. Depending on your insurance coverage, you may receive a charge related to this service.  I need to obtain your verbal consent now. Are you willing to proceed with your visit today? NARDOS PUTNAM has provided verbal consent on 03/07/2023 for a virtual visit (video or telephone). Piedad Climes, [place]  Date: 03/07/2023 2:00 PM  Virtual Visit via Video Note   I, Piedad Climes, connected with  KARLEIGH BUNTE  ([id number], 10/02/1990) on 03/07/23 at  2:00 PM EDT by a video-enabled telemedicine application and verified that I am speaking with the correct person using two identifiers.  Location: Patient: Virtual Visit  Location Patient: Home Provider: Virtual Visit Location Provider: Home Office   I discussed the limitations of evaluation and management by telemedicine and the availability of in person appointments. The patient expressed understanding and agreed to proceed.    History of Present Illness: Carla [name] is a 32 y.o. who identifies as a female who was assigned female at birth, and is being seen today for nasal and sinus congestion over the past 2 weeks that has been progressing since onset.  Notes cough that is mainly in the morning and productive from PND over night. Denies fever, chills. Noting increased fatigue and sinus headache. Denies recent travel or sick contact.   OTC -- Xyzal, Tylenol Sinus  HPI: HPI  Problems:  Patient Active Problem List   Diagnosis Date Noted   Encounter for general adult medical examination w/o abnormal findings 02/11/2023   Iron deficiency anemia due to chronic blood loss 02/11/2023   Class 2 severe obesity due to excess calories with serious comorbidity and body mass index (BMI) of 36.0 to 36.9 in adult (HCC) 02/11/2023   Weight gain 06/18/2022   Chronic migraine without aura without status migrainosus, not intractable 06/18/2022   Essential hypertension, benign 06/18/2022   Class 2 obesity due to excess calories with body mass index (BMI) of 35.0 to 35.9 in adult 06/18/2022   Postpartum depression 09/30/2019   Chronic hypertension in obstetric context, third trimester 08/19/2019   SVD (spontaneous vaginal delivery) 08/19/2019    Allergies:  Allergies  Allergen Reactions  Shellfish Allergy Anaphylaxis   Latex Itching   Medications:  Current Outpatient Medications:    amoxicillin-clavulanate (AUGMENTIN) 875-125 MG tablet, Take 1 tablet by mouth 2 (two) times daily., Disp: 14 tablet, Rfl: 0   fluticasone (FLONASE) 50 MCG/ACT nasal spray, Place 2 sprays into both nostrils daily., Disp: 16 g, Rfl: 0   amLODipine (NORVASC) 5 MG tablet, Take 1 tablet (5  mg total) by mouth daily., Disp: 30 tablet, Rfl: 10   Blood Pressure Monitoring (BLOOD PRESSURE KIT) DEVI, 1 kit by Does not apply route once a week. CHECK BP WEEKLY.  LARGE CUFF. DX O09.0, Disp: 1 kit, Rfl: 0   EPINEPHrine (EPIPEN 2-PAK) 0.3 mg/0.3 mL IJ SOAJ injection, Inject 0.3 mg into the muscle as needed for anaphylaxis., Disp: 2 each, Rfl: 5   hydrochlorothiazide (HYDRODIURIL) 12.5 MG tablet, Take 1 tablet (12.5 mg total) by mouth daily., Disp: 90 tablet, Rfl: 1   ibuprofen (ADVIL) 800 MG tablet, Take 1 tablet (800 mg total) by mouth every 8 (eight) hours as needed. (Patient not taking: Reported on 02/01/2023), Disp: 30 tablet, Rfl: 0   levocetirizine (XYZAL) 5 MG tablet, Take 1 tablet (5 mg total) by mouth every evening., Disp: 30 tablet, Rfl: 1   olopatadine (PATADAY) 0.1 % ophthalmic solution, Place 1 drop into both eyes 2 (two) times daily. (Patient not taking: Reported on 02/01/2023), Disp: 5 mL, Rfl: 0   Ubrogepant (UBRELVY) 100 MG TABS, Take 1 tablet by mouth once daily as needed (Patient not taking: Reported on 11/08/2022), Disp: 20 tablet, Rfl: 0  Observations/Objective: Patient is well-developed, well-nourished in no acute distress.  Resting comfortably at home.  Head is normocephalic, atraumatic.  No labored breathing. Speech is clear and coherent with logical content.  Patient is alert and oriented at baseline.   Assessment and Plan: 1. Acute bacterial sinusitis - amoxicillin-clavulanate (AUGMENTIN) 875-125 MG tablet; Take 1 tablet by mouth 2 (two) times daily.  Dispense: 14 tablet; Refill: 0 - fluticasone (FLONASE) 50 MCG/ACT nasal spray; Place 2 sprays into both nostrils daily.  Dispense: 16 g; Refill: 0  Rx Augmentin.  Increase fluids.  Rest.  Saline nasal spray.  Probiotic.  Mucinex as directed.  Humidifier in bedroom. Ok to continue Xyzal.  Call or return to clinic if symptoms are not improving.   Follow Up Instructions: I discussed the assessment and treatment plan  with the patient. The patient was provided an opportunity to ask questions and all were answered. The patient agreed with the plan and demonstrated an understanding of the instructions.  A copy of instructions were sent to the patient via MyChart unless otherwise noted below.   The patient was advised to call back or seek an in-person evaluation if the symptoms worsen or if the condition fails to improve as anticipated.    Piedad Climes, PA-C

## 2023-03-07 NOTE — Patient Instructions (Signed)
Carla [name], thank you for joining Piedad Climes, PA-C for today's virtual visit.  While this provider is not your primary care provider (PCP), if your PCP is located in our provider database this encounter information will be shared with them immediately following your visit.   A [facility] MyChart account gives you access to today's visit and all your visits, tests, and labs performed at [facility] " click here if you don't have a [facility] MyChart account or go to [email or web address]  Consent: (Patient) Carla [name] provided verbal consent for this virtual visit at the beginning of the encounter.  Current Medications:  Current Outpatient Medications:    amLODipine (NORVASC) 5 MG tablet, Take 1 tablet (5 mg total) by mouth daily., Disp: 30 tablet, Rfl: 10   Blood Pressure Monitoring (BLOOD PRESSURE KIT) DEVI, 1 kit by Does not apply route once a week. CHECK BP WEEKLY.  LARGE CUFF. DX O09.0, Disp: 1 kit, Rfl: 0   EPINEPHrine (EPIPEN 2-PAK) 0.3 mg/0.3 mL IJ SOAJ injection, Inject 0.3 mg into the muscle as needed for anaphylaxis., Disp: 2 each, Rfl: 5   hydrochlorothiazide (HYDRODIURIL) 12.5 MG tablet, Take 1 tablet (12.5 mg total) by mouth daily., Disp: 90 tablet, Rfl: 1   ibuprofen (ADVIL) 800 MG tablet, Take 1 tablet (800 mg total) by mouth every 8 (eight) hours as needed. (Patient not taking: Reported on 02/01/2023), Disp: 30 tablet, Rfl: 0   levocetirizine (XYZAL) 5 MG tablet, Take 1 tablet (5 mg total) by mouth every evening., Disp: 30 tablet, Rfl: 1   olopatadine (PATADAY) 0.1 % ophthalmic solution, Place 1 drop into both eyes 2 (two) times daily. (Patient not taking: Reported on 02/01/2023), Disp: 5 mL, Rfl: 0   Ubrogepant (UBRELVY) 100 MG TABS, Take 1 tablet by mouth once daily as needed (Patient not taking: Reported on 11/08/2022), Disp: 20 tablet, Rfl: 0   Medications ordered in this encounter:  No orders of the defined types were placed in this  encounter.    *If you need refills on other medications prior to your next appointment, please contact your pharmacy*  Follow-Up: Call back or seek an in-person evaluation if the symptoms worsen or if the condition fails to improve as anticipated.  [facility] [phone]  Other Instructions Please take antibiotic as directed.  Increase fluid intake.  Use Saline nasal spray.  Take a daily multivitamin. Start the [facility] as directed. Ok to continue your Xyzal and OTC sinus medications.  Place a humidifier in the bedroom.  Please call or return clinic if symptoms are not improving.  Sinusitis Sinusitis is redness, soreness, and swelling (inflammation) of the paranasal sinuses. Paranasal sinuses are air pockets within the bones of your face (beneath the eyes, the middle of the forehead, or above the eyes). In healthy paranasal sinuses, mucus is able to drain out, and air is able to circulate through them by way of your nose. However, when your paranasal sinuses are inflamed, mucus and air can become trapped. This can allow bacteria and other germs to grow and cause infection. Sinusitis can develop quickly and last only a short time (acute) or continue over a long period (chronic). Sinusitis that lasts for more than 12 weeks is considered chronic.  CAUSES  Causes of sinusitis include: Allergies. Structural abnormalities, such as displacement of the cartilage that separates your nostrils (deviated septum), which can decrease the air flow through your nose and sinuses and affect sinus drainage. Functional abnormalities, such as  when the small hairs (cilia) that line your sinuses and help remove mucus do not work properly or are not present. SYMPTOMS  Symptoms of acute and chronic sinusitis are the same. The primary symptoms are pain and pressure around the affected sinuses. Other symptoms include: Upper toothache. Earache. Headache. Bad breath. Decreased sense of smell and  taste. A cough, which worsens when you are lying flat. Fatigue. Fever. Thick drainage from your nose, which often is green and may contain pus (purulent). Swelling and warmth over the affected sinuses. DIAGNOSIS  Your caregiver will perform a physical exam. During the exam, your caregiver may: Look in your nose for signs of abnormal growths in your nostrils (nasal polyps). Tap over the affected sinus to check for signs of infection. View the inside of your sinuses (endoscopy) with a special imaging device with a light attached (endoscope), which is inserted into your sinuses. If your caregiver suspects that you have chronic sinusitis, one or more of the following tests may be recommended: Allergy tests. Nasal culture A sample of mucus is taken from your nose and sent to a lab and screened for bacteria. Nasal cytology A sample of mucus is taken from your nose and examined by your caregiver to determine if your sinusitis is related to an allergy. TREATMENT  Most cases of acute sinusitis are related to a viral infection and will resolve on their own within 10 days. Sometimes medicines are prescribed to help relieve symptoms (pain medicine, decongestants, nasal steroid sprays, or saline sprays).  However, for sinusitis related to a bacterial infection, your caregiver will prescribe antibiotic medicines. These are medicines that will help kill the bacteria causing the infection.  Rarely, sinusitis is caused by a fungal infection. In theses cases, your caregiver will prescribe antifungal medicine. For some cases of chronic sinusitis, surgery is needed. Generally, these are cases in which sinusitis recurs more than 3 times per year, despite other treatments. HOME CARE INSTRUCTIONS  Drink plenty of water. Water helps thin the mucus so your sinuses can drain more easily. Use a humidifier. Inhale steam 3 to 4 times a day (for example, sit in the bathroom with the shower running). Apply a warm, moist  washcloth to your face 3 to 4 times a day, or as directed by your caregiver. Use saline nasal sprays to help moisten and clean your sinuses. Take over-the-counter or prescription medicines for pain, discomfort, or fever only as directed by your caregiver. SEEK IMMEDIATE MEDICAL CARE IF: You have increasing pain or severe headaches. You have nausea, vomiting, or drowsiness. You have swelling around your face. You have vision problems. You have a stiff neck. You have difficulty breathing. MAKE SURE YOU:  Understand these instructions. Will watch your condition. Will get help right away if you are not doing well or get worse. Document Released: 05/09/2005 Document Revised: 08/01/2011 Document Reviewed: 05/24/2011 [facility] Patient Information 2014 [place], [place].    If you have been instructed to have an in-person evaluation today at a local Urgent Care facility, please use the link below. It will take you to a list of all of our available [facility], including address, phone number and hours of operation. Please do not delay care.  [facility]  If you or a family member do not have a primary care provider, use the link below to schedule a visit and establish care. When you choose a [facility] primary care physician or advanced practice provider, you gain a long-term partner in  health. Find a Primary Care Provider  Learn more about [facility]'s in-office and virtual care options: [facility] - Get Care Now

## 2023-03-16 NOTE — Addendum Note (Signed)
Addended by: Waldon Merl on: 03/16/2023 07:06 AM   Modules accepted: Level of Service

## 2023-03-25 ENCOUNTER — Telehealth: Payer: Commercial Managed Care - PPO | Admitting: Family Medicine

## 2023-03-25 DIAGNOSIS — J069 Acute upper respiratory infection, unspecified: Secondary | ICD-10-CM | POA: Diagnosis not present

## 2023-03-25 MED ORDER — BENZONATATE 100 MG PO CAPS
100.0000 mg | ORAL_CAPSULE | Freq: Three times a day (TID) | ORAL | 0 refills | Status: AC | PRN
Start: 2023-03-25 — End: 2023-04-01

## 2023-03-25 MED ORDER — GUAIFENESIN 100 MG/5ML PO LIQD
5.0000 mL | ORAL | 0 refills | Status: AC | PRN
Start: 2023-03-25 — End: 2023-04-01

## 2023-03-25 NOTE — Progress Notes (Signed)
Virtual Visit Consent   Carla [name], you are scheduled for a virtual visit with a [facility] provider today. Just as with appointments in the office, your consent must be obtained to participate. Your consent will be active for this visit and any virtual visit you may have with one of our providers in the next 365 days. If you have a MyChart account, a copy of this consent can be sent to you electronically.  As this is a virtual visit, video technology does not allow for your provider to perform a traditional examination. This may limit your provider's ability to fully assess your condition. If your provider identifies any concerns that need to be evaluated in person or the need to arrange testing (such as labs, EKG, etc.), we will make arrangements to do so. Although advances in technology are sophisticated, we cannot ensure that it will always work on either your end or our end. If the connection with a video visit is poor, the visit may have to be switched to a telephone visit. With either a video or telephone visit, we are not always able to ensure that we have a secure connection.  By engaging in this virtual visit, you consent to the provision of healthcare and authorize for your insurance to be billed (if applicable) for the services provided during this visit. Depending on your insurance coverage, you may receive a charge related to this service.  I need to obtain your verbal consent now. Are you willing to proceed with your visit today? Carla [name] has provided verbal consent on 03/25/2023 for a virtual visit (video or telephone). Reed Pandy, [place]  Date: 03/25/2023 11:28 AM  Virtual Visit via Video Note   I, Reed Pandy, connected with  Carla [name]  ([id number], 1991-01-07) on 03/25/23 at 11:30 AM EDT by a video-enabled telemedicine application and verified that I am speaking with the correct person using two identifiers.  Location: Patient: Virtual Visit Location Patient:  Home Provider: Virtual Visit Location Provider: Home Office   I discussed the limitations of evaluation and management by telemedicine and the availability of in person appointments. The patient expressed understanding and agreed to proceed.    History of Present Illness: Carla [name] is a 32 y.o. who identifies as a female who was assigned female at birth, and is being seen today for  c/o "I am having a sore throat and chest congestion"  Pt states symptoms started Thursday.  Pt denies fever and body aches.  Pt states she did not test for COVID.  Pt states taking thera flu.  Pt states coughing up yellow mucus.  Pt denies difficulty breathing. Pt states taking Xyzal daily and using Flonase.  Marland Kitchen HPI: HPI  Problems:  Patient Active Problem List   Diagnosis Date Noted   Encounter for general adult medical examination w/o abnormal findings 02/11/2023   Iron deficiency anemia due to chronic blood loss 02/11/2023   Class 2 severe obesity due to excess calories with serious comorbidity and body mass index (BMI) of 36.0 to 36.9 in adult [facility]) 02/11/2023   Weight gain 06/18/2022   Chronic migraine without aura without status migrainosus, not intractable 06/18/2022   Essential hypertension, benign 06/18/2022   Class 2 obesity due to excess calories with body mass index (BMI) of 35.0 to 35.9 in adult 06/18/2022   Postpartum depression 09/30/2019   Chronic hypertension in obstetric context, third trimester 08/19/2019   SVD (spontaneous vaginal delivery) 08/19/2019    Allergies:  Allergies  Allergen Reactions   Shellfish Allergy Anaphylaxis   Latex Itching   Medications:  Current Outpatient Medications:    benzonatate (TESSALON) 100 MG capsule, Take 1 capsule (100 mg total) by mouth 3 (three) times daily as needed for up to 7 days for cough., Disp: 21 capsule, Rfl: 0   guaiFENesin (ROBITUSSIN) 100 MG/5ML liquid, Take 5 mLs by mouth every 4 (four) hours as needed for up to 7 days for cough or to  loosen phlegm., Disp: 120 mL, Rfl: 0   amLODipine (NORVASC) 5 MG tablet, Take 1 tablet (5 mg total) by mouth daily., Disp: 30 tablet, Rfl: 10   amoxicillin-clavulanate (AUGMENTIN) 875-125 MG tablet, Take 1 tablet by mouth 2 (two) times daily., Disp: 14 tablet, Rfl: 0   Blood Pressure Monitoring (BLOOD PRESSURE KIT) DEVI, 1 kit by Does not apply route once a week. CHECK BP WEEKLY.  LARGE CUFF. DX O09.0, Disp: 1 kit, Rfl: 0   EPINEPHrine (EPIPEN 2-PAK) 0.3 mg/0.3 mL IJ SOAJ injection, Inject 0.3 mg into the muscle as needed for anaphylaxis., Disp: 2 each, Rfl: 5   fluticasone (FLONASE) 50 MCG/ACT nasal spray, Place 2 sprays into both nostrils daily., Disp: 16 g, Rfl: 0   hydrochlorothiazide (HYDRODIURIL) 12.5 MG tablet, Take 1 tablet (12.5 mg total) by mouth daily., Disp: 90 tablet, Rfl: 1   ibuprofen (ADVIL) 800 MG tablet, Take 1 tablet (800 mg total) by mouth every 8 (eight) hours as needed. (Patient not taking: Reported on 02/01/2023), Disp: 30 tablet, Rfl: 0   levocetirizine (XYZAL) 5 MG tablet, Take 1 tablet (5 mg total) by mouth every evening., Disp: 30 tablet, Rfl: 1   olopatadine (PATADAY) 0.1 % ophthalmic solution, Place 1 drop into both eyes 2 (two) times daily. (Patient not taking: Reported on 02/01/2023), Disp: 5 mL, Rfl: 0   Ubrogepant (UBRELVY) 100 MG TABS, Take 1 tablet by mouth once daily as needed (Patient not taking: Reported on 11/08/2022), Disp: 20 tablet, Rfl: 0  Observations/Objective: Patient is well-developed, well-nourished in no acute distress.  Resting comfortably at home.  Head is normocephalic, atraumatic.  No labored breathing.  Speech is clear and coherent with logical content.  Patient is alert and oriented at baseline.    Assessment and Plan: 1. Viral upper respiratory tract infection - guaiFENesin (ROBITUSSIN) 100 MG/5ML liquid; Take 5 mLs by mouth every 4 (four) hours as needed for up to 7 days for cough or to loosen phlegm.  Dispense: 120 mL; Refill: 0 -  benzonatate (TESSALON) 100 MG capsule; Take 1 capsule (100 mg total) by mouth 3 (three) times daily as needed for up to 7 days for cough.  Dispense: 21 capsule; Refill: 0  -Continue conservative treatments -Start Guaifenesin and tessalon as needed -Pt to F/U with PCP if symptoms persist or worsen   Follow Up Instructions: I discussed the assessment and treatment plan with the patient. The patient was provided an opportunity to ask questions and all were answered. The patient agreed with the plan and demonstrated an understanding of the instructions.  A copy of instructions were sent to the patient via MyChart unless otherwise noted below.     The patient was advised to call back or seek an in-person evaluation if the symptoms worsen or if the condition fails to improve as anticipated.    Reed Pandy, PA-C

## 2023-03-25 NOTE — Patient Instructions (Signed)
[name], thank you for joining Reed Pandy, PA-C for today's virtual visit.  While this provider is not your primary care provider (PCP), if your PCP is located in our provider database this encounter information will be shared with them immediately following your visit.   A [facility] MyChart account gives you access to today's visit and all your visits, tests, and labs performed at [facility] " click here if you don't have a [facility] MyChart account or go to [email or web address]  Consent: (Patient) [name] provided verbal consent for this virtual visit at the beginning of the encounter.  Current Medications:  Current Outpatient Medications:    benzonatate (TESSALON) 100 MG capsule, Take 1 capsule (100 mg total) by mouth 3 (three) times daily as needed for up to 7 days for cough., Disp: 21 capsule, Rfl: 0   guaiFENesin (ROBITUSSIN) 100 MG/5ML liquid, Take 5 mLs by mouth every 4 (four) hours as needed for up to 7 days for cough or to loosen phlegm., Disp: 120 mL, Rfl: 0   amLODipine (NORVASC) 5 MG tablet, Take 1 tablet (5 mg total) by mouth daily., Disp: 30 tablet, Rfl: 10   amoxicillin-clavulanate (AUGMENTIN) 875-125 MG tablet, Take 1 tablet by mouth 2 (two) times daily., Disp: 14 tablet, Rfl: 0   Blood Pressure Monitoring (BLOOD PRESSURE KIT) DEVI, 1 kit by Does not apply route once a week. CHECK BP WEEKLY.  LARGE CUFF. DX O09.0, Disp: 1 kit, Rfl: 0   EPINEPHrine (EPIPEN 2-PAK) 0.3 mg/0.3 mL IJ SOAJ injection, Inject 0.3 mg into the muscle as needed for anaphylaxis., Disp: 2 each, Rfl: 5   fluticasone (FLONASE) 50 MCG/ACT nasal spray, Place 2 sprays into both nostrils daily., Disp: 16 g, Rfl: 0   hydrochlorothiazide (HYDRODIURIL) 12.5 MG tablet, Take 1 tablet (12.5 mg total) by mouth daily., Disp: 90 tablet, Rfl: 1   ibuprofen (ADVIL) 800 MG tablet, Take 1 tablet (800 mg total) by mouth every 8 (eight) hours as needed. (Patient not taking: Reported on  02/01/2023), Disp: 30 tablet, Rfl: 0   levocetirizine (XYZAL) 5 MG tablet, Take 1 tablet (5 mg total) by mouth every evening., Disp: 30 tablet, Rfl: 1   olopatadine (PATADAY) 0.1 % ophthalmic solution, Place 1 drop into both eyes 2 (two) times daily. (Patient not taking: Reported on 02/01/2023), Disp: 5 mL, Rfl: 0   Ubrogepant (UBRELVY) 100 MG TABS, Take 1 tablet by mouth once daily as needed (Patient not taking: Reported on 11/08/2022), Disp: 20 tablet, Rfl: 0   Medications ordered in this encounter:  Meds ordered this encounter  Medications   guaiFENesin (ROBITUSSIN) 100 MG/5ML liquid    Sig: Take 5 mLs by mouth every 4 (four) hours as needed for up to 7 days for cough or to loosen phlegm.    Dispense:  120 mL    Refill:  0   benzonatate (TESSALON) 100 MG capsule    Sig: Take 1 capsule (100 mg total) by mouth 3 (three) times daily as needed for up to 7 days for cough.    Dispense:  21 capsule    Refill:  0     *If you need refills on other medications prior to your next appointment, please contact your pharmacy*  Follow-Up: Call back or seek an in-person evaluation if the symptoms worsen or if the condition fails to improve as anticipated.   Virtual Care [phone]  Other Instructions Upper Respiratory Infection, Adult An upper respiratory infection (URI) is a  common viral infection of the nose, throat, and upper air passages that lead to the lungs. The most common type of URI is the common cold. URIs usually get better on their own, without medical treatment. What are the causes? A URI is caused by a virus. You may catch a virus by: Breathing in droplets from an infected person's cough or sneeze. Touching something that has been exposed to the virus (is contaminated) and then touching your mouth, nose, or eyes. What increases the risk? You are more likely to get a URI if: You are very young or very old. You have close contact with others, such as at work, school, or  a health care facility. You smoke. You have long-term (chronic) heart or lung disease. You have a weakened disease-fighting system (immune system). You have nasal allergies or asthma. You are experiencing a lot of stress. You have poor nutrition. What are the signs or symptoms? A URI usually involves some of the following symptoms: Runny or stuffy (congested) nose. Cough. Sneezing. Sore throat. Headache. Fatigue. Fever. Loss of appetite. Pain in your forehead, behind your eyes, and over your cheekbones (sinus pain). Muscle aches. Redness or irritation of the eyes. Pressure in the ears or face. How is this diagnosed? This condition may be diagnosed based on your medical history and symptoms, and a physical exam. Your health care provider may use a swab to take a mucus sample from your nose (nasal swab). This sample can be tested to determine what virus is causing the illness. How is this treated? URIs usually get better on their own within 7-10 days. Medicines cannot cure URIs, but your health care provider may recommend certain medicines to help relieve symptoms, such as: Over-the-counter cold medicines. Cough suppressants. Coughing is a type of defense against infection that helps to clear the respiratory system, so take these medicines only as recommended by your health care provider. Fever-reducing medicines. Follow these instructions at home: Activity Rest as needed. If you have a fever, stay home from work or school until your fever is gone or until your health care provider says your URI cannot spread to other people (is no longer contagious). Your health care provider may have you wear a face mask to prevent your infection from spreading. Relieving symptoms Gargle with a mixture of salt and water 3-4 times a day or as needed. To make salt water, completely dissolve -1 tsp (3-6 g) of salt in 1 cup (237 mL) of warm water. Use a cool-mist humidifier to add moisture to the air.  This can help you breathe more easily. Eating and drinking  Drink enough fluid to keep your urine pale yellow. Eat soups and other clear broths. General instructions  Take over-the-counter and prescription medicines only as told by your health care provider. These include cold medicines, fever reducers, and cough suppressants. Do not use any products that contain nicotine or tobacco. These products include cigarettes, chewing tobacco, and vaping devices, such as e-cigarettes. If you need help quitting, ask your health care provider. Stay away from secondhand smoke. Stay up to date on all immunizations, including the yearly (annual) flu vaccine. Keep all follow-up visits. This is important. How to prevent the spread of infection to others URIs can be contagious. To prevent the infection from spreading: Wash your hands with soap and water for at least 20 seconds. If soap and water are not available, use hand sanitizer. Avoid touching your mouth, face, eyes, or nose. Cough or sneeze into a tissue  or your sleeve or elbow instead of into your hand or into the air.  Contact a health care provider if: You are getting worse instead of better. You have a fever or chills. Your mucus is brown or red. You have yellow or brown discharge coming from your nose. You have pain in your face, especially when you bend forward. You have swollen neck glands. You have pain while swallowing. You have white areas in the back of your throat. Get help right away if: You have shortness of breath that gets worse. You have severe or persistent: Headache. Ear pain. Sinus pain. Chest pain. You have chronic lung disease along with any of the following: Making high-pitched whistling sounds when you breathe, most often when you breathe out (wheezing). Prolonged cough (more than 14 days). Coughing up blood. A change in your usual mucus. You have a stiff neck. You have changes in  your: Vision. Hearing. Thinking. Mood. These symptoms may be an emergency. Get help right away. Call 911. Do not wait to see if the symptoms will go away. Do not drive yourself to the hospital. Summary An upper respiratory infection (URI) is a common infection of the nose, throat, and upper air passages that lead to the lungs. A URI is caused by a virus. URIs usually get better on their own within 7-10 days. Medicines cannot cure URIs, but your health care provider may recommend certain medicines to help relieve symptoms. This information is not intended to replace advice given to you by your health care provider. Make sure you discuss any questions you have with your health care provider. Document Revised: 12/09/2020 Document Reviewed: 12/09/2020 Elsevier Patient Education  2024 [employer]    If you have been instructed to have an in-person evaluation today at a local Urgent Care facility, please use the link below. It will take you to a list of all of our available [facility], including address, phone number and hours of operation. Please do not delay care.  [facility]  If you or a family member do not have a primary care provider, use the link below to schedule a visit and establish care. When you choose a [facility] primary care physician or advanced practice provider, you gain a long-term partner in health. Find a Primary Care Provider  Learn more about [facility]'s in-office and virtual care options: [facility] - Get Care Now

## 2023-04-11 ENCOUNTER — Other Ambulatory Visit: Payer: Self-pay

## 2023-06-19 ENCOUNTER — Encounter: Payer: Self-pay | Admitting: Emergency Medicine

## 2023-06-19 ENCOUNTER — Other Ambulatory Visit: Payer: Self-pay | Admitting: Internal Medicine

## 2023-06-19 ENCOUNTER — Other Ambulatory Visit: Payer: Self-pay

## 2023-06-19 ENCOUNTER — Ambulatory Visit: Payer: Self-pay

## 2023-06-19 ENCOUNTER — Ambulatory Visit
Admission: EM | Admit: 2023-06-19 | Discharge: 2023-06-19 | Disposition: A | Discharge status: Home / Self Care | Payer: Commercial Managed Care - PPO | Attending: Emergency Medicine | Admitting: Emergency Medicine

## 2023-06-19 ENCOUNTER — Other Ambulatory Visit (HOSPITAL_COMMUNITY): Payer: Self-pay

## 2023-06-19 DIAGNOSIS — I10 Essential (primary) hypertension: Secondary | ICD-10-CM

## 2023-06-19 DIAGNOSIS — J069 Acute upper respiratory infection, unspecified: Secondary | ICD-10-CM | POA: Diagnosis not present

## 2023-06-19 LAB — POC COVID19/FLU A&B COMBO
Covid Antigen, POC: NEGATIVE
Influenza A Antigen, POC: NEGATIVE
Influenza B Antigen, POC: NEGATIVE

## 2023-06-19 MED ORDER — PREDNISONE 10 MG (21) PO TBPK: ORAL_TABLET | Freq: Every day | ORAL | 0 refills | Status: DC

## 2023-06-19 MED ORDER — GUAIFENESIN-CODEINE 100-10 MG/5ML PO SOLN: 5.0000 mL | Freq: Four times a day (QID) | ORAL | 0 refills | Status: AC | PRN

## 2023-06-19 MED ORDER — AMLODIPINE BESYLATE 5 MG PO TABS
5.0000 mg | ORAL_TABLET | Freq: Every day | ORAL | 10 refills | Status: DC
  Filled 2023-06-19: qty 30, 30d supply, fill #0
  Filled 2023-07-27 – 2023-08-16 (×2): qty 30, 30d supply, fill #1

## 2023-06-19 MED ORDER — HYDROCHLOROTHIAZIDE 12.5 MG PO TABS
12.5000 mg | ORAL_TABLET | Freq: Every day | ORAL | 1 refills | Status: DC
  Filled 2023-06-19: qty 90, 90d supply, fill #0
  Filled 2023-09-19: qty 90, 90d supply, fill #1

## 2023-06-19 MED ORDER — BENZONATATE 100 MG PO CAPS: 100.0000 mg | ORAL_CAPSULE | Freq: Three times a day (TID) | ORAL | 0 refills | Status: DC

## 2023-06-19 NOTE — ED Provider Notes (Addendum)
Carla [name]    CSN: [id number] Arrival date & time: 06/19/23  1027      History   Chief Complaint Chief Complaint  Patient presents with   Cough    HPI Carla [name] is a 33 y.o. female.   Patient presents for evaluation of a nonproductive cough, shortness of breath with coughing and chest tightness present for 1 day.  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  Denies respiratory history but has had to use inhaler ever since COVID-19 virus, denies smoking.  Poor appetite but tolerating some food and liquids.  Has not attempted treatment.  Denies wheezing, fever.  Blood pressure 123/84, O2 saturation 100% on room air, temperature 98.5, pulse 66, respirations 18  Past Medical History:  Diagnosis Date   Allergy    Anemia    Depression    postpartum   Headache(784.0)    Hyperemesis arising during pregnancy    PONV (postoperative nausea and vomiting)    Postpartum depression    Pregnancy induced hypertension    Spontaneous abortion 12/03/2010    Patient Active Problem List   Diagnosis Date Noted   Encounter for general adult medical examination w/o abnormal findings 02/11/2023   Iron deficiency anemia due to chronic blood loss 02/11/2023   Class 2 severe obesity due to excess calories with serious comorbidity and body mass index (BMI) of 36.0 to 36.9 in adult (HCC) 02/11/2023   Weight gain 06/18/2022   Chronic migraine without aura without status migrainosus, not intractable 06/18/2022   Essential hypertension, benign 06/18/2022   Class 2 obesity due to excess calories with body mass index (BMI) of 35.0 to 35.9 in adult 06/18/2022   Postpartum depression 09/30/2019   Chronic hypertension in obstetric context, third trimester 08/19/2019   SVD (spontaneous vaginal delivery) 08/19/2019    Past Surgical History:  Procedure Laterality Date   CHOLECYSTECTOMY N/A 05/13/2015   Procedure: LAPAROSCOPIC CHOLECYSTECTOMY ;  Surgeon: Manus Rudd, MD;   Location: [facility] OR;  Service: General;  Laterality: N/A;   DILATION AND EVACUATION  12/03/2010   Procedure: DILATATION AND EVACUATION (D&E);  Surgeon: Roseanna Rainbow, MD;  Location: [facility] ORS;  Service: Gynecology;  Laterality: N/A;   OVARIAN CYST REMOVAL     TONSILLECTOMY     TUBAL LIGATION N/A 08/20/2019   Procedure: POST PARTUM TUBAL LIGATION;  Surgeon: [name], DO;  Location: [facility] ORS;  Service: Gynecology;  Laterality: N/A;    OB History     Gravida  8   Para  3   Term  3   Preterm      AB  5   Living  3      SAB  5   IAB      Ectopic      Multiple  0   Live Births  3        Obstetric Comments  Patient has had multiple miscarriages- she has had 2 recent SAB. The most recent is reported 1 1/2 months ago. Pt states she may have had up to 8 pregnancies, has had multiple SAB- lost count.          Home Medications    Prior to Admission medications   Medication Sig Start Date End Date Taking? Authorizing Provider  benzonatate (TESSALON) 100 MG capsule Take 1 capsule (100 mg total) by mouth every 8 (eight) hours. 06/19/23  Yes Genesis Novosad R, NP  guaiFENesin-codeine 100-10 MG/5ML syrup Take 5 mLs by mouth  every 6 (six) hours as needed for cough. 06/19/23  Yes Nashae Maudlin R, NP  predniSONE (STERAPRED UNI-PAK 21 TAB) 10 MG (21) TBPK tablet Take by mouth daily. Take 6 tabs by mouth daily  for 1 days, then 5 tabs for 1 days, then 4 tabs for 1 days, then 3 tabs for 1 days, 2 tabs for 1 days, then 1 tab by mouth daily for 1 days 06/19/23  Yes Rotha Cassels R, NP  amLODipine (NORVASC) 5 MG tablet Take 1 tablet (5 mg total) by mouth daily. 06/08/22   Dorothyann Peng, MD  amoxicillin-clavulanate (AUGMENTIN) 875-125 MG tablet Take 1 tablet by mouth 2 (two) times daily. Patient not taking: Reported on 06/19/2023 03/07/23   Waldon Merl, PA-C  Blood Pressure Monitoring (BLOOD PRESSURE KIT) DEVI 1 kit by Does not apply route once a week. CHECK BP WEEKLY.   LARGE CUFF. DX O09.0 02/08/19   Fair, Hoyle Sauer, MD  EPINEPHrine (EPIPEN 2-PAK) 0.3 mg/0.3 mL IJ SOAJ injection Inject 0.3 mg into the muscle as needed for anaphylaxis. 06/08/22   Dorothyann Peng, MD  fluticasone [facility]) 50 MCG/ACT nasal spray Place 2 sprays into both nostrils daily. 03/07/23   Waldon Merl, PA-C  hydrochlorothiazide (HYDRODIURIL) 12.5 MG tablet Take 1 tablet (12.5 mg total) by mouth daily. 11/22/22   Dorothyann Peng, MD  ibuprofen (ADVIL) 800 MG tablet Take 1 tablet (800 mg total) by mouth every 8 (eight) hours as needed. Patient not taking: Reported on 02/01/2023 10/21/20   Margaretann Loveless, PA-C  levocetirizine (XYZAL) 5 MG tablet Take 1 tablet (5 mg total) by mouth every evening. 09/27/22   Freddy Finner, NP  olopatadine (PATADAY) 0.1 % ophthalmic solution Place 1 drop into both eyes 2 (two) times daily. Patient not taking: Reported on 02/01/2023 09/27/22   Freddy Finner, NP  Ubrogepant (UBRELVY) 100 MG TABS Take 1 tablet by mouth once daily as needed Patient not taking: Reported on 11/08/2022 06/08/22   Dorothyann Peng, MD    Family History Family History  Problem Relation Age of Onset   Arthritis Mother    Cancer Mother    Diabetes Mother    Hearing loss Mother    Hypertension Mother    Obesity Mother    Stroke Mother    Hypertension Father    Stroke Father    ADD / ADHD Brother    Asthma Brother    Learning disabilities Brother    Alcohol abuse Paternal Aunt    Alcohol abuse Paternal Uncle    COPD Maternal Grandmother    Cancer Maternal Grandfather     Social History Social History   Tobacco Use   Smoking status: Never   Smokeless tobacco: Never  Vaping Use   Vaping status: Never Used  Substance Use Topics   Alcohol use: No   Drug use: No     Allergies   Shellfish allergy and Latex   Review of Systems Review of Systems   Physical Exam Triage Vital Signs ED Triage Vitals [06/19/23 1200]  Encounter Vitals Group     BP      Systolic BP  Percentile      Diastolic BP Percentile      Pulse      Resp      Temp      Temp src      SpO2      Weight      Height      Head Circumference  Peak Flow      Pain Score 0     Pain Loc      Pain Education      Exclude from Growth Chart    No data found.  Updated Vital Signs LMP 06/10/2023   Visual Acuity Right Eye Distance:   Left Eye Distance:   Bilateral Distance:    Right Eye Near:   Left Eye Near:    Bilateral Near:     Physical Exam Constitutional:      Appearance: Normal appearance.  HENT:     Head: Normocephalic.     Right Ear: Tympanic membrane, ear canal and external ear normal.     Left Ear: Tympanic membrane, ear canal and external ear normal.     Nose: Congestion present. No rhinorrhea.     Mouth/Throat:     Pharynx: No oropharyngeal exudate or posterior oropharyngeal erythema.  Eyes:     Extraocular Movements: Extraocular movements intact.  Cardiovascular:     Rate and Rhythm: Normal rate and regular rhythm.     Pulses: Normal pulses.     Heart sounds: Normal heart sounds.  Pulmonary:     Effort: Pulmonary effort is normal.     Breath sounds: Normal breath sounds.  Chest:     Comments: Tenderness present to the center of the chest wall over the sternum without ecchymosis swelling or deformity, chest wall is symmetrical Neurological:     Mental Status: She is alert and oriented to person, place, and time. Mental status is at baseline.      UC Treatments / Results  Labs (all labs ordered are listed, but only abnormal results are displayed) Labs Reviewed  POC COVID19/FLU A&B COMBO    EKG   Radiology No results found.  Procedures Procedures (including critical care time)  Medications Ordered in UC Medications - No data to display  Initial Impression / Assessment and Plan / UC Course  I have reviewed the triage vital signs and the nursing notes.  Pertinent labs & imaging results that were available during my care of the  patient were reviewed by me and considered in my medical decision making (see chart for details).  Viral URI with cough  Patient is in no signs of distress nor toxic appearing.  Vital signs are stable.  Low suspicion for pneumonia, pneumothorax or bronchitis and therefore will defer imaging.  COVID and flu test negative.  Newness present to the chest wall, most likely because of chest tightness.  Prescribed prednisone, Tessalon and guaifenesin codeine, PDMP reviewed, low risk.May use additional over-the-counter medications as needed for supportive care.  May follow-up with urgent care as needed if symptoms persist or worsen.  Note given.   Final Clinical Impressions(s) / UC Diagnoses   Final diagnoses:  Viral URI with cough     Discharge Instructions      Your symptoms today are most likely being caused by a virus and should steadily improve in time it can take up to 7 to 10 days before you truly start to see a turnaround however things will get better    COVID and flu test negative  For chest tightness which is most likely muscular as I am able to press on the chest wall and cause pain begin prednisone every morning and every evening for 6 days, this will open and relax the airway  Use Tessalon pill every 8 hours as needed for cough, may use guaifenesin codeine every 6 hours for additional comfort, be mindful  this can make you sleepy You can take Tylenol and/or Ibuprofen as needed for fever reduction and pain relief.   For cough: honey 1/2 to 1 teaspoon (you can dilute the honey in water or another fluid).  You can also use guaifenesin and dextromethorphan for cough. You can use a humidifier for chest congestion and cough.  If you don't have a humidifier, you can sit in the bathroom with the hot shower running.      For sore throat: try warm salt water gargles, cepacol lozenges, throat spray, warm tea or water with lemon/honey, popsicles or ice, or OTC cold relief medicine for throat  discomfort.   For congestion: take a daily anti-histamine like Zyrtec, Claritin, and a oral decongestant, such as pseudoephedrine.  You can also use Flonase 1-2 sprays in each nostril daily.   It is important to stay hydrated: drink plenty of fluids (water, gatorade/powerade/pedialyte, juices, or teas) to keep your throat moisturized and help further relieve irritation/discomfort.    ED Prescriptions     Medication Sig Dispense Auth. Provider   predniSONE (STERAPRED UNI-PAK 21 TAB) 10 MG (21) TBPK tablet Take by mouth daily. Take 6 tabs by mouth daily  for 1 days, then 5 tabs for 1 days, then 4 tabs for 1 days, then 3 tabs for 1 days, 2 tabs for 1 days, then 1 tab by mouth daily for 1 days 21 tablet Zacheriah Stumpe R, NP   benzonatate (TESSALON) 100 MG capsule Take 1 capsule (100 mg total) by mouth every 8 (eight) hours. 21 capsule Jishnu Jenniges R, NP   guaiFENesin-codeine 100-10 MG/5ML syrup Take 5 mLs by mouth every 6 (six) hours as needed for cough. 120 mL Venson Ferencz, Elita Boone, NP      I have reviewed the PDMP during this encounter.   Valinda Hoar, NP 06/19/23 1215    Valinda Hoar, NP 06/19/23 1215

## 2023-06-19 NOTE — Discharge Instructions (Signed)
Your symptoms today are most likely being caused by a virus and should steadily improve in time it can take up to 7 to 10 days before you truly start to see a turnaround however things will get better    COVID and flu test negative  For chest tightness which is most likely muscular as I am able to press on the chest wall and cause pain begin prednisone every morning and every evening for 6 days, this will open and relax the airway  Use Tessalon pill every 8 hours as needed for cough, may use guaifenesin codeine every 6 hours for additional comfort, be mindful this can make you sleepy You can take Tylenol and/or Ibuprofen as needed for fever reduction and pain relief.   For cough: honey 1/2 to 1 teaspoon (you can dilute the honey in water or another fluid).  You can also use guaifenesin and dextromethorphan for cough. You can use a humidifier for chest congestion and cough.  If you don't have a humidifier, you can sit in the bathroom with the hot shower running.      For sore throat: try warm salt water gargles, cepacol lozenges, throat spray, warm tea or water with lemon/honey, popsicles or ice, or OTC cold relief medicine for throat discomfort.   For congestion: take a daily anti-histamine like Zyrtec, Claritin, and a oral decongestant, such as pseudoephedrine.  You can also use Flonase 1-2 sprays in each nostril daily.   It is important to stay hydrated: drink plenty of fluids (water, gatorade/powerade/pedialyte, juices, or teas) to keep your throat moisturized and help further relieve irritation/discomfort.

## 2023-06-19 NOTE — ED Triage Notes (Signed)
Seen by provider prior to this nurse

## 2023-07-06 ENCOUNTER — Telehealth: Payer: Commercial Managed Care - PPO | Admitting: Family Medicine

## 2023-07-06 ENCOUNTER — Other Ambulatory Visit (HOSPITAL_COMMUNITY): Payer: Self-pay

## 2023-07-06 ENCOUNTER — Telehealth: Payer: Commercial Managed Care - PPO | Admitting: Physician Assistant

## 2023-07-06 DIAGNOSIS — B9689 Other specified bacterial agents as the cause of diseases classified elsewhere: Secondary | ICD-10-CM | POA: Diagnosis not present

## 2023-07-06 DIAGNOSIS — J208 Acute bronchitis due to other specified organisms: Secondary | ICD-10-CM | POA: Diagnosis not present

## 2023-07-06 MED ORDER — BENZONATATE 100 MG PO CAPS
100.0000 mg | ORAL_CAPSULE | Freq: Three times a day (TID) | ORAL | 0 refills | Status: DC | PRN
  Filled 2023-07-06: qty 30, 5d supply, fill #0

## 2023-07-06 MED ORDER — ALBUTEROL SULFATE HFA 108 (90 BASE) MCG/ACT IN AERS
2.0000 | INHALATION_SPRAY | Freq: Four times a day (QID) | RESPIRATORY_TRACT | 0 refills | Status: AC | PRN
  Filled 2023-07-06: qty 6.7, 25d supply, fill #0

## 2023-07-06 MED ORDER — AMOXICILLIN-POT CLAVULANATE 875-125 MG PO TABS
1.0000 | ORAL_TABLET | Freq: Two times a day (BID) | ORAL | 0 refills | Status: AC
  Filled 2023-07-06: qty 14, 7d supply, fill #0

## 2023-07-06 MED ORDER — FLUCONAZOLE 150 MG PO TABS
ORAL_TABLET | ORAL | 0 refills | Status: DC
  Filled 2023-07-06: qty 2, 3d supply, fill #0

## 2023-07-06 NOTE — Progress Notes (Signed)

## 2023-07-06 NOTE — Progress Notes (Signed)
Virtual Visit Consent   Carla [name], you are scheduled for a virtual visit with a [facility] provider today. Just as with appointments in the office, your consent must be obtained to participate. Your consent will be active for this visit and any virtual visit you may have with one of our providers in the next 365 days. If you have a MyChart account, a copy of this consent can be sent to you electronically.  As this is a virtual visit, video technology does not allow for your provider to perform a traditional examination. This may limit your provider's ability to fully assess your condition. If your provider identifies any concerns that need to be evaluated in person or the need to arrange testing (such as labs, EKG, etc.), we will make arrangements to do so. Although advances in technology are sophisticated, we cannot ensure that it will always work on either your end or our end. If the connection with a video visit is poor, the visit may have to be switched to a telephone visit. With either a video or telephone visit, we are not always able to ensure that we have a secure connection.  By engaging in this virtual visit, you consent to the provision of healthcare and authorize for your insurance to be billed (if applicable) for the services provided during this visit. Depending on your insurance coverage, you may receive a charge related to this service.  I need to obtain your verbal consent now. Are you willing to proceed with your visit today? KEALOHILANI MAIORINO has provided verbal consent on 07/06/2023 for a virtual visit (video or telephone). Piedad Climes, [place]  Date: 07/06/2023 2:09 PM   Virtual Visit via Video Note   I, Piedad Climes, connected with  TERESITA FANTON  ([id number], [birth year]) on 07/06/23 at  2:15 PM EST by a video-enabled telemedicine application and verified that I am speaking with the correct person using two identifiers.  Location: Patient: Virtual Visit  Location Patient: Home Provider: Virtual Visit Location Provider: Home Office   I discussed the limitations of evaluation and management by telemedicine and the availability of in person appointments. The patient expressed understanding and agreed to proceed.    History of Present Illness: Carla [name] is a 33 y.o. who identifies as a female who was assigned female at birth, and is being seen today for continued and progressive chest congestion and cough that is mainly dry but now occasionally productive of thick phlegm. Notes headache and nasal congestion without sinus pain. Denies fever. Notes still with fatigue. Notes this has been going on for over 2 weeks. Denies chest pain. Notes some SOB with exertion. Submitted an e-visit and was diagnosed with sinusitis, prescribed Augmentin which she has not started.  HPI: HPI  Problems:  Patient Active Problem List   Diagnosis Date Noted   Encounter for general adult medical examination w/o abnormal findings 02/11/2023   Iron deficiency anemia due to chronic blood loss 02/11/2023   Class 2 severe obesity due to excess calories with serious comorbidity and body mass index (BMI) of 36.0 to 36.9 in adult [facility]) 02/11/2023   Weight gain 06/18/2022   Chronic migraine without aura without status migrainosus, not intractable 06/18/2022   Essential hypertension, benign 06/18/2022   Class 2 obesity due to excess calories with body mass index (BMI) of 35.0 to 35.9 in adult 06/18/2022   Postpartum depression 09/30/2019   Chronic hypertension in obstetric context, third trimester 08/19/2019   SVD (  spontaneous vaginal delivery) 08/19/2019    Allergies:  Allergies  Allergen Reactions   Shellfish Allergy Anaphylaxis   Latex Itching   Medications:  Current Outpatient Medications:    albuterol (VENTOLIN HFA) 108 (90 Base) MCG/ACT inhaler, Inhale 2 puffs into the lungs every 6 (six) hours as needed for wheezing or shortness of breath., Disp: 8 g, Rfl: 0    benzonatate (TESSALON) 100 MG capsule, Take 1-2 capsules (100-200 mg total) by mouth 3 (three) times daily as needed for cough., Disp: 30 capsule, Rfl: 0   fluconazole (DIFLUCAN) 150 MG tablet, Take 1 tablet PO once. Repeat in 3 days if needed., Disp: 2 tablet, Rfl: 0   amLODipine (NORVASC) 5 MG tablet, Take 1 tablet (5 mg total) by mouth daily., Disp: 30 tablet, Rfl: 10   amoxicillin-clavulanate (AUGMENTIN) 875-125 MG tablet, Take 1 tablet by mouth 2 (two) times daily for 7 days., Disp: 14 tablet, Rfl: 0   Blood Pressure Monitoring (BLOOD PRESSURE KIT) DEVI, 1 kit by Does not apply route once a week. CHECK BP WEEKLY.  LARGE CUFF. DX O09.0, Disp: 1 kit, Rfl: 0   EPINEPHrine (EPIPEN 2-PAK) 0.3 mg/0.3 mL IJ SOAJ injection, Inject 0.3 mg into the muscle as needed for anaphylaxis., Disp: 2 each, Rfl: 5   fluticasone (FLONASE) 50 MCG/ACT nasal spray, Place 2 sprays into both nostrils daily., Disp: 16 g, Rfl: 0   guaiFENesin-codeine 100-10 MG/5ML syrup, Take 5 mLs by mouth every 6 (six) hours as needed for cough., Disp: 120 mL, Rfl: 0   hydrochlorothiazide (HYDRODIURIL) 12.5 MG tablet, Take 1 tablet (12.5 mg total) by mouth daily., Disp: 90 tablet, Rfl: 1   ibuprofen (ADVIL) 800 MG tablet, Take 1 tablet (800 mg total) by mouth every 8 (eight) hours as needed. (Patient not taking: Reported on 02/01/2023), Disp: 30 tablet, Rfl: 0   levocetirizine (XYZAL) 5 MG tablet, Take 1 tablet (5 mg total) by mouth every evening., Disp: 30 tablet, Rfl: 1   olopatadine (PATADAY) 0.1 % ophthalmic solution, Place 1 drop into both eyes 2 (two) times daily. (Patient not taking: Reported on 02/01/2023), Disp: 5 mL, Rfl: 0   Ubrogepant (UBRELVY) 100 MG TABS, Take 1 tablet by mouth once daily as needed (Patient not taking: Reported on 11/08/2022), Disp: 20 tablet, Rfl: 0  Observations/Objective: Patient is well-developed, well-nourished in no acute distress.  Resting comfortably  at home.  Head is normocephalic, atraumatic.  No  labored breathing.  Speech is clear and coherent with logical content.  Patient is alert and oriented at baseline.   Assessment and Plan: 1. Acute bacterial bronchitis (Primary) - albuterol (VENTOLIN HFA) 108 (90 Base) MCG/ACT inhaler; Inhale 2 puffs into the lungs every 6 (six) hours as needed for wheezing or shortness of breath.  Dispense: 8 g; Refill: 0 - benzonatate (TESSALON) 100 MG capsule; Take 1-2 capsules (100-200 mg total) by mouth 3 (three) times daily as needed for cough.  Dispense: 30 capsule; Refill: 0  She is to take the Augmentin prescribed earlier today as directed.  Increase fluids.  Rest.  Saline nasal spray.  Probiotic.  Mucinex as directed.  Humidifier in bedroom. Tessalon and albuterol per orders.  Call or return to clinic if symptoms are not improving.   Follow Up Instructions: I discussed the assessment and treatment plan with the patient. The patient was provided an opportunity to ask questions and all were answered. The patient agreed with the plan and demonstrated an understanding of the instructions.  A copy of instructions  were sent to the patient via MyChart unless otherwise noted below.   The patient was advised to call back or seek an in-person evaluation if the symptoms worsen or if the condition fails to improve as anticipated.    Piedad Climes, PA-C

## 2023-07-06 NOTE — Patient Instructions (Signed)
Carla [name], thank you for joining Piedad Climes, PA-C for today's virtual visit.  While this provider is not your primary care provider (PCP), if your PCP is located in our provider database this encounter information will be shared with them immediately following your visit.   A [facility] MyChart account gives you access to today's visit and all your visits, tests, and labs performed at [facility] " click here if you don't have a [facility] MyChart account or go to [email or web address]  Consent: (Patient) Carla [name] provided verbal consent for this virtual visit at the beginning of the encounter.  Current Medications:  Current Outpatient Medications:    amLODipine (NORVASC) 5 MG tablet, Take 1 tablet (5 mg total) by mouth daily., Disp: 30 tablet, Rfl: 10   amoxicillin-clavulanate (AUGMENTIN) 875-125 MG tablet, Take 1 tablet by mouth 2 (two) times daily for 7 days., Disp: 14 tablet, Rfl: 0   benzonatate (TESSALON) 100 MG capsule, Take 1 capsule (100 mg total) by mouth every 8 (eight) hours., Disp: 21 capsule, Rfl: 0   Blood Pressure Monitoring (BLOOD PRESSURE KIT) DEVI, 1 kit by Does not apply route once a week. CHECK BP WEEKLY.  LARGE CUFF. DX O09.0, Disp: 1 kit, Rfl: 0   EPINEPHrine (EPIPEN 2-PAK) 0.3 mg/0.3 mL IJ SOAJ injection, Inject 0.3 mg into the muscle as needed for anaphylaxis., Disp: 2 each, Rfl: 5   fluticasone (FLONASE) 50 MCG/ACT nasal spray, Place 2 sprays into both nostrils daily., Disp: 16 g, Rfl: 0   guaiFENesin-codeine 100-10 MG/5ML syrup, Take 5 mLs by mouth every 6 (six) hours as needed for cough., Disp: 120 mL, Rfl: 0   hydrochlorothiazide (HYDRODIURIL) 12.5 MG tablet, Take 1 tablet (12.5 mg total) by mouth daily., Disp: 90 tablet, Rfl: 1   ibuprofen (ADVIL) 800 MG tablet, Take 1 tablet (800 mg total) by mouth every 8 (eight) hours as needed. (Patient not taking: Reported on 02/01/2023), Disp: 30 tablet, Rfl: 0   levocetirizine (XYZAL) 5  MG tablet, Take 1 tablet (5 mg total) by mouth every evening., Disp: 30 tablet, Rfl: 1   olopatadine (PATADAY) 0.1 % ophthalmic solution, Place 1 drop into both eyes 2 (two) times daily. (Patient not taking: Reported on 02/01/2023), Disp: 5 mL, Rfl: 0   predniSONE (STERAPRED UNI-PAK 21 TAB) 10 MG (21) TBPK tablet, Take by mouth daily. Take 6 tabs by mouth daily  for 1 days, then 5 tabs for 1 days, then 4 tabs for 1 days, then 3 tabs for 1 days, 2 tabs for 1 days, then 1 tab by mouth daily for 1 days, Disp: 21 tablet, Rfl: 0   Ubrogepant (UBRELVY) 100 MG TABS, Take 1 tablet by mouth once daily as needed (Patient not taking: Reported on 11/08/2022), Disp: 20 tablet, Rfl: 0   Medications ordered in this encounter:  No orders of the defined types were placed in this encounter.    *If you need refills on other medications prior to your next appointment, please contact your pharmacy*  Follow-Up: Call back or seek an in-person evaluation if the symptoms worsen or if the condition fails to improve as anticipated.  [facility] [phone]  Other Instructions Take antibiotic (Augmentin) as directed.  Increase fluids.  Get plenty of rest. Use Mucinex for congestion. I have added on a refill of the Tessalon (can take up to 2 capsules three times daily). Start the albuterol as directed. Take a daily probiotic (I recommend Align or Culturelle, but  even Activia Yogurt may be beneficial).  A humidifier placed in the bedroom may offer some relief for a dry, scratchy throat of nasal irritation.  Read information below on acute bronchitis. Please call or return to clinic if symptoms are not improving.  Acute Bronchitis Bronchitis is when the airways that extend from the windpipe into the lungs get red, puffy, and painful (inflamed). Bronchitis often causes thick spit (mucus) to develop. This leads to a cough. A cough is the most common symptom of bronchitis. In acute bronchitis, the condition usually  begins suddenly and goes away over time (usually in 2 weeks). Smoking, allergies, and asthma can make bronchitis worse. Repeated episodes of bronchitis may cause more lung problems.  HOME CARE Rest. Drink enough fluids to keep your pee (urine) clear or pale yellow (unless you need to limit fluids as told by your doctor). Only take over-the-counter or prescription medicines as told by your doctor. Avoid smoking and secondhand smoke. These can make bronchitis worse. If you are a smoker, think about using nicotine gum or skin patches. Quitting smoking will help your lungs heal faster. Reduce the chance of getting bronchitis again by: Washing your hands often. Avoiding people with cold symptoms. Trying not to touch your hands to your mouth, nose, or eyes. Follow up with your doctor as told.  GET HELP IF: Your symptoms do not improve after 1 week of treatment. Symptoms include: Cough. Fever. Coughing up thick spit. Body aches. Chest congestion. Chills. Shortness of breath. Sore throat.  GET HELP RIGHT AWAY IF:  You have an increased fever. You have chills. You have severe shortness of breath. You have bloody thick spit (sputum). You throw up (vomit) often. You lose too much body fluid (dehydration). You have a severe headache. You faint.  MAKE SURE YOU:  Understand these instructions. Will watch your condition. Will get help right away if you are not doing well or get worse. Document Released: 10/26/2007 Document Revised: 01/09/2013 Document Reviewed: 10/30/2012 [facility] Patient Information 2015 [place], [place]. This information is not intended to replace advice given to you by your health care provider. Make sure you discuss any questions you have with your health care provider.    If you have been instructed to have an in-person evaluation today at a local Urgent Care facility, please use the link below. It will take you to a list of all of our available [facility], including address, phone number and hours of operation. Please do not delay care.  [facility]  If you or a family member do not have a primary care provider, use the link below to schedule a visit and establish care. When you choose a [facility] primary care physician or advanced practice provider, you gain a long-term partner in health. Find a Primary Care Provider  Learn more about [facility] in-office and virtual care options: [facility] - Get Care Now

## 2023-08-01 ENCOUNTER — Ambulatory Visit: Payer: Commercial Managed Care - PPO | Admitting: Internal Medicine

## 2023-08-08 ENCOUNTER — Other Ambulatory Visit (HOSPITAL_COMMUNITY): Payer: Self-pay

## 2023-09-04 ENCOUNTER — Ambulatory Visit: Admitting: Internal Medicine

## 2023-09-04 ENCOUNTER — Encounter: Payer: Self-pay | Admitting: Internal Medicine

## 2023-09-04 ENCOUNTER — Other Ambulatory Visit (HOSPITAL_COMMUNITY): Payer: Self-pay

## 2023-09-04 VITALS — BP 126/82 | HR 81 | Temp 98.1°F | Ht 64.0 in | Wt 215.6 lb

## 2023-09-04 DIAGNOSIS — Z6837 Body mass index (BMI) 37.0-37.9, adult: Secondary | ICD-10-CM

## 2023-09-04 DIAGNOSIS — E66812 Obesity, class 2: Secondary | ICD-10-CM | POA: Diagnosis not present

## 2023-09-04 DIAGNOSIS — J3089 Other allergic rhinitis: Secondary | ICD-10-CM | POA: Diagnosis not present

## 2023-09-04 DIAGNOSIS — G43709 Chronic migraine without aura, not intractable, without status migrainosus: Secondary | ICD-10-CM

## 2023-09-04 DIAGNOSIS — I10 Essential (primary) hypertension: Secondary | ICD-10-CM

## 2023-09-04 MED ORDER — LEVOCETIRIZINE DIHYDROCHLORIDE 5 MG PO TABS
5.0000 mg | ORAL_TABLET | Freq: Every evening | ORAL | 2 refills | Status: DC
  Filled 2023-09-04 – 2023-09-19 (×2): qty 90, 90d supply, fill #0
  Filled 2023-12-25: qty 90, 90d supply, fill #1
  Filled 2024-03-25: qty 90, 90d supply, fill #2

## 2023-09-04 MED ORDER — AMLODIPINE BESYLATE 5 MG PO TABS
5.0000 mg | ORAL_TABLET | Freq: Every day | ORAL | 2 refills | Status: DC
  Filled 2023-09-04 – 2023-09-19 (×2): qty 90, 90d supply, fill #0
  Filled 2023-12-25: qty 90, 90d supply, fill #1
  Filled 2024-03-25: qty 90, 90d supply, fill #2

## 2023-09-04 MED ORDER — FLUTICASONE PROPIONATE 50 MCG/ACT NA SUSP
2.0000 | Freq: Every day | NASAL | 1 refills | Status: DC
  Filled 2023-09-04: qty 48, 90d supply, fill #0

## 2023-09-04 NOTE — Progress Notes (Signed)
 I,Victoria T Basil Lim, CMA,acting as a Neurosurgeon for Smiley Dung, MD.,have documented all relevant documentation on the behalf of Smiley Dung, MD,as directed by  Smiley Dung, MD while in the presence of Smiley Dung, MD.  Subjective:  Patient ID: Carla [name] , female    DOB: March 28, 1991 , 33 y.o.   MRN: [id number]  Chief Complaint  Patient presents with   Hypertension    Patient presents today for bpc. She reports compliance with medications. Denies headache, chest pain & sob.  She will soon schedule to complete pap.     HPI Discussed the use of AI scribe software for clinical note transcription with the patient, who gave verbal consent to proceed.  History of Present Illness She is a 33 year old female who presents for blood pressure check.  She has recently initiated an exercise routine, which includes walking and running a mile in her neighborhood. She is contemplating adding body weight exercises at home but has not yet started strength training.  She has not been monitoring her blood pressure at home recently, as she feels well. She is currently on amlodipine  5 mg and hydrochlorothiazide  for blood pressure management and notes significant improvement in her blood pressure.  She has a known allergy to shellfish. For seasonal allergies, she uses over-the-counter Flonase  and Xyzal  and does not require a prescription refill. She has previously used Pataday  but does not need a refill at this time.   Hypertension This is a chronic problem. The current episode started in the past 7 days. Pertinent negatives include no orthopnea, palpitations or shortness of breath. Past treatments include calcium channel blockers. The current treatment provides mild improvement.     Past Medical History:  Diagnosis Date   Allergy    Anemia    Depression    postpartum   Headache(784.0)    Hyperemesis arising during pregnancy    PONV (postoperative nausea and vomiting)    Postpartum  depression    Pregnancy induced hypertension    Spontaneous abortion 12/03/2010     Family History  Problem Relation Age of Onset   Arthritis Mother    Cancer Mother    Diabetes Mother    Hearing loss Mother    Hypertension Mother    Obesity Mother    Stroke Mother    Hypertension Father    Stroke Father    ADD / ADHD Brother    Asthma Brother    Learning disabilities Brother    Alcohol abuse Paternal Aunt    Alcohol abuse Paternal Uncle    COPD Maternal Grandmother    Cancer Maternal Grandfather      Current Outpatient Medications:    albuterol  (VENTOLIN  HFA) 108 (90 Base) MCG/ACT inhaler, Inhale 2 puffs into the lungs every 6 (six) hours as needed for wheezing or shortness of breath., Disp: 6.7 g, Rfl: 0   Blood Pressure Monitoring (BLOOD PRESSURE KIT) DEVI, 1 kit by Does not apply route once a week. CHECK BP WEEKLY.  LARGE CUFF. DX O09.0, Disp: 1 kit, Rfl: 0   EPINEPHrine  (EPIPEN  2-PAK) 0.3 mg/0.3 mL IJ SOAJ injection, Inject 0.3 mg into the muscle as needed for anaphylaxis., Disp: 2 each, Rfl: 5   guaiFENesin -codeine  100-10 MG/5ML syrup, Take 5 mLs by mouth every 6 (six) hours as needed for cough., Disp: 120 mL, Rfl: 0   hydrochlorothiazide  (HYDRODIURIL ) 12.5 MG tablet, Take 1 tablet (12.5 mg total) by mouth daily., Disp: 90 tablet, Rfl: 1   amLODipine  (  NORVASC ) 5 MG tablet, Take 1 tablet (5 mg total) by mouth daily., Disp: 90 tablet, Rfl: 2   fluticasone  (FLONASE ) 50 MCG/ACT nasal spray, Place 2 sprays into both nostrils daily, Disp: 48 g, Rfl: 1   levocetirizine (XYZAL ) 5 MG tablet, Take 1 tablet (5 mg total) by mouth every evening., Disp: 90 tablet, Rfl: 2   Ubrogepant  (UBRELVY ) 100 MG TABS, Take 1 tablet by mouth once daily as needed (Patient not taking: Reported on 09/04/2023), Disp: 20 tablet, Rfl: 0   Allergies  Allergen Reactions   Shellfish Allergy Anaphylaxis   Latex Itching     Review of Systems  Constitutional: Negative.   Respiratory: Negative.  Negative  for shortness of breath.   Cardiovascular: Negative.  Negative for palpitations and orthopnea.  Gastrointestinal: Negative.   Neurological: Negative.   Psychiatric/Behavioral: Negative.       Today's Vitals   09/04/23 1606  BP: 126/82  Pulse: 81  Temp: 98.1 F (36.7 C)  SpO2: 98%  Weight: 215 lb 9.6 oz (97.8 kg)  Height: 5\' 4"  (1.626 m)   Body mass index is 37.01 kg/m.  Wt Readings from Last 3 Encounters:  09/04/23 215 lb 9.6 oz (97.8 kg)  02/01/23 214 lb 12.8 oz (97.4 kg)  11/08/22 206 lb (93.4 kg)     Objective:  Physical Exam Vitals and nursing note reviewed.  Constitutional:      Appearance: Normal appearance. She is obese.  HENT:     Head: Normocephalic and atraumatic.  Eyes:     Extraocular Movements: Extraocular movements intact.  Cardiovascular:     Rate and Rhythm: Normal rate and regular rhythm.     Heart sounds: Normal heart sounds.  Pulmonary:     Effort: Pulmonary effort is normal.     Breath sounds: Normal breath sounds.  Musculoskeletal:     Cervical back: Normal range of motion.  Skin:    General: Skin is warm.  Neurological:     General: No focal deficit present.     Mental Status: She is alert.  Psychiatric:        Mood and Affect: Mood normal.        Behavior: Behavior normal.         Assessment And Plan:  Essential hypertension, benign Assessment & Plan: Hypertension well-managed with current medication regimen. Blood pressure improved, home monitoring discontinued. Advised against long-term ibuprofen  use due to blood pressure impact. - Continue amlodipine  5 mg daily. - Continue hydrochlorothiazide . - Encourage regular home blood pressure monitoring. - Discontinue ibuprofen  from chart.  Orders: -     CMP14+EGFR -     amLODIPine  Besylate; Take 1 tablet (5 mg total) by mouth daily.  Dispense: 90 tablet; Refill: 2  Environmental and seasonal allergies Assessment & Plan: Seasonal allergic rhinitis managed with over-the-counter  Flonase  and Xyzal . Discussed cost benefits of purchasing through [facility]. - Continue over-the-counter Flonase  as needed. - Continue over-the-counter Xyzal  as needed. - Consider purchasing over-the-counter medications through [facility] for potential cost savings.  Orders: -     Levocetirizine Dihydrochloride ; Take 1 tablet (5 mg total) by mouth every evening.  Dispense: 90 tablet; Refill: 2 -     Fluticasone  Propionate; Place 2 sprays into both nostrils daily  Dispense: 48 g; Refill: 1  Chronic migraine without aura without status migrainosus, not intractable Assessment & Plan: Migraines persist, managed with Ubrelvy  samples due to insurance issues. - Provide additional samples of Ubrelvy . - Encouraged to avoid known triggers   Class 2  severe obesity due to excess calories with serious comorbidity and body mass index (BMI) of 37.0 to 37.9 in adult [facility]) Assessment & Plan: She is encouraged to strive for BMI less than 30 to decrease cardiac risk. Advised to aim for at least 150 minutes of exercise per week.     Return if symptoms worsen or fail to improve.  Patient was given opportunity to ask questions. Patient verbalized understanding of the plan and was able to repeat key elements of the plan. All questions were answered to their satisfaction.    I, Smiley Dung, MD, have reviewed all documentation for this visit. The documentation on 09/04/23 for the exam, diagnosis, procedures, and orders are all accurate and complete.   IF YOU HAVE BEEN REFERRED TO A SPECIALIST, IT MAY TAKE 1-2 WEEKS TO SCHEDULE/PROCESS THE REFERRAL. IF YOU HAVE NOT HEARD FROM US /SPECIALIST IN TWO WEEKS, PLEASE GIVE US  A CALL AT [phone] X [phone].   THE PATIENT IS ENCOURAGED TO PRACTICE SOCIAL DISTANCING DUE TO THE COVID-19 PANDEMIC.

## 2023-09-04 NOTE — Patient Instructions (Signed)
 Hypertension, Adult Hypertension is another name for high blood pressure. High blood pressure forces your heart to work harder to pump blood. This can cause problems over time. There are two numbers in a blood pressure reading. There is a top number (systolic) over a bottom number (diastolic). It is best to have a blood pressure that is below 120/80. What are the causes? The cause of this condition is not known. Some other conditions can lead to high blood pressure. What increases the risk? Some lifestyle factors can make you more likely to develop high blood pressure: Smoking. Not getting enough exercise or physical activity. Being overweight. Having too much fat, sugar, calories, or salt (sodium) in your diet. Drinking too much alcohol. Other risk factors include: Having any of these conditions: Heart disease. Diabetes. High cholesterol. Kidney disease. Obstructive sleep apnea. Having a family history of high blood pressure and high cholesterol. Age. The risk increases with age. Stress. What are the signs or symptoms? High blood pressure may not cause symptoms. Very high blood pressure (hypertensive crisis) may cause: Headache. Fast or uneven heartbeats (palpitations). Shortness of breath. Nosebleed. Vomiting or feeling like you may vomit (nauseous). Changes in how you see. Very bad chest pain. Feeling dizzy. Seizures. How is this treated? This condition is treated by making healthy lifestyle changes, such as: Eating healthy foods. Exercising more. Drinking less alcohol. Your doctor may prescribe medicine if lifestyle changes do not help enough and if: Your top number is above 130. Your bottom number is above 80. Your personal target blood pressure may vary. Follow these instructions at home: Eating and drinking  If told, follow the DASH eating plan. To follow this plan: Fill one half of your plate at each meal with fruits and vegetables. Fill one fourth of your plate  at each meal with whole grains. Whole grains include whole-wheat pasta, brown rice, and whole-grain bread. Eat or drink low-fat dairy products, such as skim milk or low-fat yogurt. Fill one fourth of your plate at each meal with low-fat (lean) proteins. Low-fat proteins include fish, chicken without skin, eggs, beans, and tofu. Avoid fatty meat, cured and processed meat, or chicken with skin. Avoid pre-made or processed food. Limit the amount of salt in your diet to less than 1,500 mg each day. Do not drink alcohol if: Your doctor tells you not to drink. You are pregnant, may be pregnant, or are planning to become pregnant. If you drink alcohol: Limit how much you have to: 0-1 drink a day for women. 0-2 drinks a day for men. Know how much alcohol is in your drink. In the [place] one drink equals one 12 oz bottle of beer (355 mL), one 5 oz glass of wine (148 mL), or one 1 oz glass of hard liquor (44 mL). Lifestyle  Work with your doctor to stay at a healthy weight or to lose weight. Ask your doctor what the best weight is for you. Get at least 30 minutes of exercise that causes your heart to beat faster (aerobic exercise) most days of the week. This may include walking, swimming, or biking. Get at least 30 minutes of exercise that strengthens your muscles (resistance exercise) at least 3 days a week. This may include lifting weights or doing Pilates. Do not smoke or use any products that contain nicotine or tobacco. If you need help quitting, ask your doctor. Check your blood pressure at home as told by your doctor. Keep all follow-up visits. Medicines Take over-the-counter and prescription medicines  only as told by your doctor. Follow directions carefully. Do not skip doses of blood pressure medicine. The medicine does not work as well if you skip doses. Skipping doses also puts you at risk for problems. Ask your doctor about side effects or reactions to medicines that you should watch  for. Contact a doctor if: You think you are having a reaction to the medicine you are taking. You have headaches that keep coming back. You feel dizzy. You have swelling in your ankles. You have trouble with your vision. Get help right away if: You get a very bad headache. You start to feel mixed up (confused). You feel weak or numb. You feel faint. You have very bad pain in your: Chest. Belly (abdomen). You vomit more than once. You have trouble breathing. These symptoms may be an emergency. Get help right away. Call 911. Do not wait to see if the symptoms will go away. Do not drive yourself to the hospital. Summary Hypertension is another name for high blood pressure. High blood pressure forces your heart to work harder to pump blood. For most people, a normal blood pressure is less than 120/80. Making healthy choices can help lower blood pressure. If your blood pressure does not get lower with healthy choices, you may need to take medicine. This information is not intended to replace advice given to you by your health care provider. Make sure you discuss any questions you have with your health care provider. Document Revised: 02/25/2021 Document Reviewed: 02/25/2021 Elsevier Patient Education  2024 [employer].

## 2023-09-05 ENCOUNTER — Encounter: Payer: Self-pay | Admitting: Internal Medicine

## 2023-09-05 LAB — CMP14+EGFR
ALT: 10 IU/L (ref 0–32)
AST: 9 IU/L (ref 0–40)
Albumin: 4.3 g/dL (ref 3.9–4.9)
Alkaline Phosphatase: 93 IU/L (ref 44–121)
BUN/Creatinine Ratio: 10 (ref 9–23)
BUN: 8 mg/dL (ref 6–20)
Bilirubin Total: 0.6 mg/dL (ref 0.0–1.2)
CO2: 24 mmol/L (ref 20–29)
Calcium: 9.4 mg/dL (ref 8.7–10.2)
Chloride: 102 mmol/L (ref 96–106)
Creatinine, Ser: 0.79 mg/dL (ref 0.57–1.00)
Globulin, Total: 2.5 g/dL (ref 1.5–4.5)
Glucose: 84 mg/dL (ref 70–99)
Potassium: 3.4 mmol/L — ABNORMAL LOW (ref 3.5–5.2)
Sodium: 141 mmol/L (ref 134–144)
Total Protein: 6.8 g/dL (ref 6.0–8.5)
eGFR: 100 mL/min/1.73

## 2023-09-10 NOTE — Assessment & Plan Note (Signed)
 Seasonal allergic rhinitis managed with over-the-counter Flonase  and Xyzal . Discussed cost benefits of purchasing through [facility]. - Continue over-the-counter Flonase  as needed. - Continue over-the-counter Xyzal  as needed. - Consider purchasing over-the-counter medications through [facility] for potential cost savings.

## 2023-09-10 NOTE — Assessment & Plan Note (Signed)
 Hypertension well-managed with current medication regimen. Blood pressure improved, home monitoring discontinued. Advised against long-term ibuprofen  use due to blood pressure impact. - Continue amlodipine  5 mg daily. - Continue hydrochlorothiazide . - Encourage regular home blood pressure monitoring. - Discontinue ibuprofen  from chart.

## 2023-09-10 NOTE — Assessment & Plan Note (Signed)
 Migraines persist, managed with Ubrelvy  samples due to insurance issues. - Provide additional samples of Ubrelvy . - Encouraged to avoid known triggers

## 2023-09-10 NOTE — Assessment & Plan Note (Signed)
 She is encouraged to strive for BMI less than 30 to decrease cardiac risk. Advised to aim for at least 150 minutes of exercise per week.

## 2023-09-14 ENCOUNTER — Other Ambulatory Visit (HOSPITAL_COMMUNITY): Payer: Self-pay

## 2023-09-19 ENCOUNTER — Other Ambulatory Visit (HOSPITAL_COMMUNITY): Payer: Self-pay

## 2023-09-19 ENCOUNTER — Encounter: Payer: Self-pay | Admitting: Physician Assistant

## 2023-09-19 ENCOUNTER — Telehealth: Admitting: Physician Assistant

## 2023-09-19 ENCOUNTER — Other Ambulatory Visit: Payer: Self-pay

## 2023-09-19 DIAGNOSIS — H938X1 Other specified disorders of right ear: Secondary | ICD-10-CM

## 2023-09-19 MED ORDER — FLUTICASONE PROPIONATE 50 MCG/ACT NA SUSP
2.0000 | Freq: Every day | NASAL | 0 refills | Status: AC
  Filled 2023-09-19: qty 16, 30d supply, fill #0

## 2023-09-19 NOTE — Patient Instructions (Signed)
  Carla [name], thank you for joining Hyla Maillard, PA-C for today's virtual visit.  While this provider is not your primary care provider (PCP), if your PCP is located in our provider database this encounter information will be shared with them immediately following your visit.   A [facility] MyChart account gives you access to today's visit and all your visits, tests, and labs performed at [facility] " click here if you don't have a [facility] MyChart account or go to [email or web address]  Consent: (Patient) Carla [name] provided verbal consent for this virtual visit at the beginning of the encounter.  Current Medications:  Current Outpatient Medications:    albuterol  (VENTOLIN  HFA) 108 (90 Base) MCG/ACT inhaler, Inhale 2 puffs into the lungs every 6 (six) hours as needed for wheezing or shortness of breath., Disp: 6.7 g, Rfl: 0   amLODipine  (NORVASC ) 5 MG tablet, Take 1 tablet (5 mg total) by mouth daily., Disp: 90 tablet, Rfl: 2   Blood Pressure Monitoring (BLOOD PRESSURE KIT) DEVI, 1 kit by Does not apply route once a week. CHECK BP WEEKLY.  LARGE CUFF. DX O09.0, Disp: 1 kit, Rfl: 0   EPINEPHrine  (EPIPEN  2-PAK) 0.3 mg/0.3 mL IJ SOAJ injection, Inject 0.3 mg into the muscle as needed for anaphylaxis., Disp: 2 each, Rfl: 5   fluticasone  (FLONASE ) 50 MCG/ACT nasal spray, Place 2 sprays into both nostrils daily, Disp: 48 g, Rfl: 1   guaiFENesin -codeine  100-10 MG/5ML syrup, Take 5 mLs by mouth every 6 (six) hours as needed for cough., Disp: 120 mL, Rfl: 0   hydrochlorothiazide  (HYDRODIURIL ) 12.5 MG tablet, Take 1 tablet (12.5 mg total) by mouth daily., Disp: 90 tablet, Rfl: 1   levocetirizine (XYZAL ) 5 MG tablet, Take 1 tablet (5 mg total) by mouth every evening., Disp: 90 tablet, Rfl: 2   Ubrogepant  (UBRELVY ) 100 MG TABS, Take 1 tablet by mouth once daily as needed (Patient not taking: Reported on 09/04/2023), Disp: 20 tablet, Rfl: 0   Medications ordered in this  encounter:  No orders of the defined types were placed in this encounter.    *If you need refills on other medications prior to your next appointment, please contact your pharmacy*  Follow-Up: Call back or seek an in-person evaluation if the symptoms worsen or if the condition fails to improve as anticipated.  [facility] [phone]  Other Instructions Please start the Flonase  once daily along with a daily antihistamine. Please upload the ear camera photo/video to the message sent so we can assess and adjust treatment if needed.   If you have been instructed to have an in-person evaluation today at a local Urgent Care facility, please use the link below. It will take you to a list of all of our available [facility], including address, phone number and hours of operation. Please do not delay care.  [facility]  If you or a family member do not have a primary care provider, use the link below to schedule a visit and establish care. When you choose a [facility] primary care physician or advanced practice provider, you gain a long-term partner in health. Find a Primary Care Provider  Learn more about [facility] in-office and virtual care options: [place] - Get Care Now

## 2023-09-19 NOTE — Progress Notes (Signed)
 Virtual Visit Consent   Carla [name], you are scheduled for a virtual visit with a [facility] provider today. Just as with appointments in the office, your consent must be obtained to participate. Your consent will be active for this visit and any virtual visit you may have with one of our providers in the next 365 days. If you have a MyChart account, a copy of this consent can be sent to you electronically.  As this is a virtual visit, video technology does not allow for your provider to perform a traditional examination. This may limit your provider's ability to fully assess your condition. If your provider identifies any concerns that need to be evaluated in person or the need to arrange testing (such as labs, EKG, etc.), we will make arrangements to do so. Although advances in technology are sophisticated, we cannot ensure that it will always work on either your end or our end. If the connection with a video visit is poor, the visit may have to be switched to a telephone visit. With either a video or telephone visit, we are not always able to ensure that we have a secure connection.  By engaging in this virtual visit, you consent to the provision of healthcare and authorize for your insurance to be billed (if applicable) for the services provided during this visit. Depending on your insurance coverage, you may receive a charge related to this service.  I need to obtain your verbal consent now. Are you willing to proceed with your visit today? Carla [name] has provided verbal consent on 09/19/2023 for a virtual visit (video or telephone). Hyla Maillard, [place]  Date: 09/19/2023 2:22 PM   Virtual Visit via Video Note   I, Hyla Maillard, connected with  Carla [name]  ([id number], [birth year]) on 09/19/23 at  2:15 PM EDT by a video-enabled telemedicine application and verified that I am speaking with the correct person using two identifiers.  Location: Patient: Virtual Visit  Location Patient: Home Provider: Virtual Visit Location Provider: Home Office   I discussed the limitations of evaluation and management by telemedicine and the availability of in person appointments. The patient expressed understanding and agreed to proceed.    History of Present Illness: Carla [name] is a 33 y.o. who identifies as a female who was assigned female at birth, and is being seen today for R ear pain pressure/popping starting over the past few days. Denies overt ear pain. Some decreased/muffled hearing. Denies symptoms of the L ear. Denies URI symptoms.  [place] the beach and got swept under the waves a few times. Did not note abrupt onset of symptoms.    HPI: HPI  Problems:  Patient Active Problem List   Diagnosis Date Noted   Environmental and seasonal allergies 09/04/2023   Encounter for general adult medical examination w/o abnormal findings 02/11/2023   Iron deficiency anemia due to chronic blood loss 02/11/2023   Class 2 severe obesity due to excess calories with serious comorbidity and body mass index (BMI) of 37.0 to 37.9 in adult (HCC) 02/11/2023   Weight gain 06/18/2022   Chronic migraine without aura without status migrainosus, not intractable 06/18/2022   Essential hypertension, benign 06/18/2022   Class 2 obesity due to excess calories with body mass index (BMI) of 35.0 to 35.9 in adult 06/18/2022   Postpartum depression 09/30/2019   Chronic hypertension in obstetric context, third trimester 08/19/2019   SVD (spontaneous vaginal delivery) 08/19/2019    Allergies:  Allergies  Allergen Reactions   Shellfish Allergy Anaphylaxis   Latex Itching   Medications:  Current Outpatient Medications:    fluticasone  (FLONASE ) 50 MCG/ACT nasal spray, Place 2 sprays into both nostrils daily., Disp: 16 g, Rfl: 0   albuterol  (VENTOLIN  HFA) 108 (90 Base) MCG/ACT inhaler, Inhale 2 puffs into the lungs every 6 (six) hours as needed for wheezing or shortness of  breath., Disp: 6.7 g, Rfl: 0   amLODipine  (NORVASC ) 5 MG tablet, Take 1 tablet (5 mg total) by mouth daily., Disp: 90 tablet, Rfl: 2   Blood Pressure Monitoring (BLOOD PRESSURE KIT) DEVI, 1 kit by Does not apply route once a week. CHECK BP WEEKLY.  LARGE CUFF. DX O09.0, Disp: 1 kit, Rfl: 0   EPINEPHrine  (EPIPEN  2-PAK) 0.3 mg/0.3 mL IJ SOAJ injection, Inject 0.3 mg into the muscle as needed for anaphylaxis., Disp: 2 each, Rfl: 5   guaiFENesin -codeine  100-10 MG/5ML syrup, Take 5 mLs by mouth every 6 (six) hours as needed for cough., Disp: 120 mL, Rfl: 0   hydrochlorothiazide  (HYDRODIURIL ) 12.5 MG tablet, Take 1 tablet (12.5 mg total) by mouth daily., Disp: 90 tablet, Rfl: 1   levocetirizine (XYZAL ) 5 MG tablet, Take 1 tablet (5 mg total) by mouth every evening., Disp: 90 tablet, Rfl: 2   Ubrogepant  (UBRELVY ) 100 MG TABS, Take 1 tablet by mouth once daily as needed (Patient not taking: Reported on 09/04/2023), Disp: 20 tablet, Rfl: 0  Observations/Objective: Patient is well-developed, well-nourished in no acute distress.  Resting comfortably at home.  Head is normocephalic, atraumatic.  No labored breathing. Speech is clear and coherent with logical content.  Patient is alert and oriented at baseline.   Assessment and Plan: 1. Ear fullness, right (Primary) - fluticasone  (FLONASE ) 50 MCG/ACT nasal spray; Place 2 sprays into both nostrils daily.  Dispense: 16 g; Refill: 0  ETD versus cerumen impaction. She has an ear camera and is going to upload phots once off of work for review. Will have her start nasal steroid spray for suspected ETD. Will adjust treatment once pictures are available for review.  Follow Up Instructions: I discussed the assessment and treatment plan with the patient. The patient was provided an opportunity to ask questions and all were answered. The patient agreed with the plan and demonstrated an understanding of the instructions.  A copy of instructions were sent to the  patient via MyChart unless otherwise noted below.   The patient was advised to call back or seek an in-person evaluation if the symptoms worsen or if the condition fails to improve as anticipated.    Hyla Maillard, PA-C

## 2023-10-20 ENCOUNTER — Other Ambulatory Visit (HOSPITAL_COMMUNITY): Payer: Self-pay

## 2023-10-20 ENCOUNTER — Telehealth: Admitting: Physician Assistant

## 2023-10-20 ENCOUNTER — Encounter

## 2023-10-20 DIAGNOSIS — K047 Periapical abscess without sinus: Secondary | ICD-10-CM | POA: Diagnosis not present

## 2023-10-20 MED ORDER — CLINDAMYCIN HCL 300 MG PO CAPS
300.0000 mg | ORAL_CAPSULE | Freq: Three times a day (TID) | ORAL | 0 refills | Status: AC
  Filled 2023-10-20: qty 21, 7d supply, fill #0

## 2023-10-20 MED ORDER — FLUCONAZOLE 150 MG PO TABS
150.0000 mg | ORAL_TABLET | ORAL | 0 refills | Status: DC | PRN
  Filled 2023-10-20: qty 2, 6d supply, fill #0

## 2023-10-20 NOTE — Addendum Note (Signed)
 Addended by: Angelia Kelp on: 10/20/2023 11:06 AM   Modules accepted: Orders

## 2023-10-20 NOTE — Progress Notes (Signed)

## 2023-11-16 ENCOUNTER — Other Ambulatory Visit (HOSPITAL_COMMUNITY): Payer: Self-pay

## 2023-11-16 ENCOUNTER — Telehealth: Admitting: Physician Assistant

## 2023-11-16 DIAGNOSIS — R3989 Other symptoms and signs involving the genitourinary system: Secondary | ICD-10-CM

## 2023-11-16 MED ORDER — FLUCONAZOLE 150 MG PO TABS
ORAL_TABLET | ORAL | 0 refills | Status: DC
  Filled 2023-11-16: qty 2, 3d supply, fill #0

## 2023-11-16 MED ORDER — CEPHALEXIN 500 MG PO CAPS
500.0000 mg | ORAL_CAPSULE | Freq: Two times a day (BID) | ORAL | 0 refills | Status: AC
  Filled 2023-11-16: qty 14, 7d supply, fill #0

## 2023-11-16 NOTE — Addendum Note (Signed)
 Addended by: GLADIS ELSIE BROCKS on: 11/16/2023 08:20 AM   Modules accepted: Orders

## 2023-11-16 NOTE — Progress Notes (Signed)
 I have spent 5 minutes in review of e-visit questionnaire, review and updating patient chart, medical decision making and response to patient.   Piedad Climes, PA-C

## 2023-11-16 NOTE — Progress Notes (Signed)

## 2023-11-17 ENCOUNTER — Other Ambulatory Visit (HOSPITAL_COMMUNITY): Payer: Self-pay

## 2023-12-25 ENCOUNTER — Other Ambulatory Visit: Payer: Self-pay | Admitting: Internal Medicine

## 2023-12-26 ENCOUNTER — Other Ambulatory Visit: Payer: Self-pay

## 2023-12-27 ENCOUNTER — Other Ambulatory Visit: Payer: Self-pay

## 2023-12-27 ENCOUNTER — Other Ambulatory Visit (HOSPITAL_COMMUNITY): Payer: Self-pay

## 2023-12-27 MED ORDER — HYDROCHLOROTHIAZIDE 12.5 MG PO TABS
12.5000 mg | ORAL_TABLET | Freq: Every day | ORAL | 1 refills | Status: DC
  Filled 2023-12-27: qty 90, 90d supply, fill #0
  Filled 2024-03-25: qty 90, 90d supply, fill #1

## 2024-02-12 ENCOUNTER — Ambulatory Visit (INDEPENDENT_AMBULATORY_CARE_PROVIDER_SITE_OTHER): Payer: Commercial Managed Care - PPO | Admitting: Internal Medicine

## 2024-02-12 ENCOUNTER — Encounter: Payer: Self-pay | Admitting: Internal Medicine

## 2024-02-12 ENCOUNTER — Ambulatory Visit: Payer: Self-pay | Admitting: Internal Medicine

## 2024-02-12 ENCOUNTER — Encounter: Payer: Commercial Managed Care - PPO | Admitting: Internal Medicine

## 2024-02-12 VITALS — BP 110/82 | HR 86 | Temp 98.3°F | Ht 64.0 in | Wt 212.6 lb

## 2024-02-12 DIAGNOSIS — E66812 Obesity, class 2: Secondary | ICD-10-CM

## 2024-02-12 DIAGNOSIS — Z23 Encounter for immunization: Secondary | ICD-10-CM | POA: Diagnosis not present

## 2024-02-12 DIAGNOSIS — G43709 Chronic migraine without aura, not intractable, without status migrainosus: Secondary | ICD-10-CM

## 2024-02-12 DIAGNOSIS — Z6836 Body mass index (BMI) 36.0-36.9, adult: Secondary | ICD-10-CM | POA: Diagnosis not present

## 2024-02-12 DIAGNOSIS — D500 Iron deficiency anemia secondary to blood loss (chronic): Secondary | ICD-10-CM | POA: Diagnosis not present

## 2024-02-12 DIAGNOSIS — Z0000 Encounter for general adult medical examination without abnormal findings: Secondary | ICD-10-CM | POA: Diagnosis not present

## 2024-02-12 DIAGNOSIS — I10 Essential (primary) hypertension: Secondary | ICD-10-CM | POA: Diagnosis not present

## 2024-02-12 LAB — POCT URINALYSIS DIP (CLINITEK)
Bilirubin, UA: NEGATIVE
Glucose, UA: NEGATIVE mg/dL
Ketones, POC UA: NEGATIVE mg/dL
Leukocytes, UA: NEGATIVE
Nitrite, UA: NEGATIVE
POC PROTEIN,UA: NEGATIVE
Spec Grav, UA: 1.015 (ref 1.010–1.025)
Urobilinogen, UA: 0.2 U/dL
pH, UA: 6.5 (ref 5.0–8.0)

## 2024-02-12 NOTE — Progress Notes (Signed)
 I,Carla [name], CMA,acting as a Neurosurgeon for Carla LOISE Slocumb, MD.,have documented all relevant documentation on the behalf of Carla LOISE Slocumb, MD,as directed by  Carla LOISE Slocumb, MD while in the presence of Carla LOISE Slocumb, MD.  Subjective:    Patient ID: Carla [name] , female    DOB: [birth year] , 33 y.o.   MRN: [id number]  Chief Complaint  Patient presents with   Annual Exam    Patient presents today for annual exam. She reports compliance with medications. Denies headache, chest pain & sob.  GYN: Dr Rudy. She will soon call to schedule pap.    Hypertension    HPI Discussed the use of AI scribe software for clinical note transcription with the patient, who gave verbal consent to proceed.  History of Present Illness Carla [name] is a 33 year old female with hypertension who presents for a physical and blood pressure check.  She monitors her blood pressure at home, with readings approximately 120/80 mmHg. Her current medications include hydrochlorothiazide  12.5 mg and amlodipine  5 mg daily.  She has a history of migraines and was prescribed Ubrelvy , but she has not been taking it due to lack of insurance coverage.  Her exercise routine includes running and walking four days a week while her children are in practice. She drinks water  regularly, filling her 30-ounce bottle three to four times a day, and consumes fruit salads three to four times a week. She also eats salads, green beans, and broccoli regularly.  She has a history of lifelong anemia and denies seeing blood in her stools. She has had two rounds of hepatitis B vaccination but is allergic to the vaccine and could not complete the third dose. Her titers show immunity.  She had a tubal ligation and is overdue for a Pap smear, with the last recorded in 2019. She reports regular bowel movements every two days and denies any new findings on self-breast exams, which she performs daily. No new breast lumps, regular menstrual  cycles, currently menstruating.   Hypertension This is a chronic problem. The current episode started more than 1 year ago. The problem is controlled. Pertinent negatives include no blurred vision, chest pain, palpitations or shortness of breath. Risk factors for coronary artery disease include sedentary lifestyle. Past treatments include calcium channel blockers and diuretics. Compliance problems include exercise.  There is no history of kidney disease, heart failure or retinopathy.     Past Medical History:  Diagnosis Date   Allergy    Anemia    Depression    postpartum   Headache(784.0)    Hyperemesis arising during pregnancy    PONV (postoperative nausea and vomiting)    Postpartum depression    Pregnancy induced hypertension    Spontaneous abortion 12/03/2010     Family History  Problem Relation Age of Onset   Arthritis Mother    Cancer Mother    Diabetes Mother    Hearing loss Mother    Hypertension Mother    Obesity Mother    Stroke Mother    Hypertension Father    Stroke Father    ADD / ADHD Brother    Asthma Brother    Learning disabilities Brother    Alcohol abuse Paternal Aunt    Alcohol abuse Paternal Uncle    COPD Maternal Grandmother    Cancer Maternal Grandfather      Current Outpatient Medications:    albuterol  (VENTOLIN  HFA) 108 (90 Base) MCG/ACT inhaler, Inhale 2 puffs into  the lungs every 6 (six) hours as needed for wheezing or shortness of breath., Disp: 6.7 g, Rfl: 0   amLODipine  (NORVASC ) 5 MG tablet, Take 1 tablet (5 mg total) by mouth daily., Disp: 90 tablet, Rfl: 2   Blood Pressure Monitoring (BLOOD PRESSURE KIT) DEVI, 1 kit by Does not apply route once a week. CHECK BP WEEKLY.  LARGE CUFF. DX O09.0, Disp: 1 kit, Rfl: 0   EPINEPHrine  (EPIPEN  2-PAK) 0.3 mg/0.3 mL IJ SOAJ injection, Inject 0.3 mg into the muscle as needed for anaphylaxis., Disp: 2 each, Rfl: 5   fluconazole  (DIFLUCAN ) 150 MG tablet, Take one tablet by mouth once. Repeat in 3 days  if needed., Disp: 2 tablet, Rfl: 0   fluticasone  (FLONASE ) 50 MCG/ACT nasal spray, Place 2 sprays into both nostrils daily., Disp: 16 g, Rfl: 0   guaiFENesin -codeine  100-10 MG/5ML syrup, Take 5 mLs by mouth every 6 (six) hours as needed for cough., Disp: 120 mL, Rfl: 0   hydrochlorothiazide  (HYDRODIURIL ) 12.5 MG tablet, Take 1 tablet (12.5 mg total) by mouth daily., Disp: 90 tablet, Rfl: 1   levocetirizine (XYZAL ) 5 MG tablet, Take 1 tablet (5 mg total) by mouth every evening., Disp: 90 tablet, Rfl: 2   Ubrogepant  (UBRELVY ) 100 MG TABS, Take 1 tablet by mouth once daily as needed (Patient not taking: Reported on 02/12/2024), Disp: 20 tablet, Rfl: 0   Allergies  Allergen Reactions   Shellfish Allergy Anaphylaxis   Latex Itching      The patient states she uses tubal ligation for birth control. Patient's last menstrual period was 02/12/2024 (exact date).. Negative for Dysmenorrhea. Negative for: breast discharge, breast lump(s), breast pain and breast self exam. Associated symptoms include abnormal vaginal bleeding. Pertinent negatives include abnormal bleeding (hematology), anxiety, decreased libido, depression, difficulty falling sleep, dyspareunia, history of infertility, nocturia, sexual dysfunction, sleep disturbances, urinary incontinence, urinary urgency, vaginal discharge and vaginal itching. Diet regular.The patient states her exercise level is    . The patient's tobacco use is:  Social History   Tobacco Use  Smoking Status Never  Smokeless Tobacco Never  . She has been exposed to passive smoke. The patient's alcohol use is:  Social History   Substance and Sexual Activity  Alcohol Use No    Review of Systems  Constitutional: Negative.   HENT: Negative.    Eyes: Negative.  Negative for blurred vision.  Respiratory: Negative.  Negative for shortness of breath.   Cardiovascular: Negative.  Negative for chest pain and palpitations.  Gastrointestinal: Negative.   Endocrine:  Negative.   Genitourinary: Negative.   Musculoskeletal: Negative.   Skin: Negative.   Allergic/Immunologic: Negative.   Neurological: Negative.   Hematological: Negative.   Psychiatric/Behavioral: Negative.       Today's Vitals   02/12/24 1523  BP: 110/82  Pulse: 86  Temp: 98.3 F (36.8 C)  SpO2: 98%  Weight: 212 lb 9.6 oz (96.4 kg)  Height: 5' 4 (1.626 m)   Body mass index is 36.49 kg/m.  Wt Readings from Last 3 Encounters:  02/12/24 212 lb 9.6 oz (96.4 kg)  09/04/23 215 lb 9.6 oz (97.8 kg)  02/01/23 214 lb 12.8 oz (97.4 kg)     Objective:  Physical Exam Vitals and nursing note reviewed.  Constitutional:      Appearance: Normal appearance. She is obese.  HENT:     Head: Normocephalic and atraumatic.     Right Ear: Tympanic membrane, ear canal and external ear normal.     Left  Ear: Tympanic membrane, ear canal and external ear normal.     Nose: Nose normal.     Mouth/Throat:     Mouth: Mucous membranes are moist.     Pharynx: Oropharynx is clear.  Eyes:     Extraocular Movements: Extraocular movements intact.     Conjunctiva/sclera: Conjunctivae normal.     Pupils: Pupils are equal, round, and reactive to light.  Cardiovascular:     Rate and Rhythm: Normal rate and regular rhythm.     Pulses: Normal pulses.     Heart sounds: Normal heart sounds.  Pulmonary:     Effort: Pulmonary effort is normal.     Breath sounds: Normal breath sounds.  Chest:  Breasts:    Tanner Score is 5.     Right: Normal.     Left: Normal.     Comments: Skin tag medial to left breast Abdominal:     General: Bowel sounds are normal.     Palpations: Abdomen is soft.  Genitourinary:    Comments: deferred Musculoskeletal:        General: Normal range of motion.     Cervical back: Normal range of motion and neck supple.  Skin:    General: Skin is warm and dry.  Neurological:     General: No focal deficit present.     Mental Status: She is alert and oriented to person, place, and  time.  Psychiatric:        Mood and Affect: Mood normal.        Behavior: Behavior normal.         Assessment And Plan:     Encounter for general adult medical examination w/o abnormal findings Assessment & Plan: A full exam was performed.  Importance of monthly self breast exams was discussed with the patient.  She is advised to get 30-45 minutes of regular exercise, no less than four to five days per week. Both weight-bearing and aerobic exercises are recommended.  She is advised to follow a healthy diet with at least six fruits/veggies per day, decrease intake of red meat and other saturated fats and to increase fish intake to twice weekly.  Meats/fish should not be fried -- baked, boiled or broiled is preferable. It is also important to cut back on your sugar intake.  Be sure to read labels - try to avoid anything with added sugar, high fructose corn syrup or other sweeteners.  If you must use a sweetener, you can try stevia or monkfruit.  It is also important to avoid artificially sweetened foods/beverages and diet drinks. Lastly, wear SPF 50 sunscreen on exposed skin and when in direct sunlight for an extended period of time.  Be sure to avoid fast food restaurants and aim for at least 60 ounces of water  daily.      Orders: -     CBC -     CMP14+EGFR -     Lipid panel  Essential hypertension, benign Assessment & Plan: Chronic.  EKG performed, NSR w/o acute changes.  Blood pressure well-controlled at 110/82 mmHg. Target diastolic <80 mmHg as per Dr. [place]'s recommendation. Current medications include hydrochlorothiazide  12.5 mg and amlodipine  5 mg. - Continue hydrochlorothiazide  12.5 mg. - Continue amlodipine  5 mg. - Follow low sodium diet.  - Follow up in six months  Orders: -     POCT URINALYSIS DIP (CLINITEK) -     Microalbumin / creatinine urine ratio -     EKG 12-Lead  Iron deficiency anemia due  to chronic blood loss Assessment & Plan: Chronic anemia with no reported  blood in stools. Lifelong anemia with no evidence of sickle cell trait. Further investigation needed. - Schedule follow-up appointment for further evaluation of anemia. - Check labs as below.  Orders: -     Iron, TIBC and Ferritin Panel  Chronic migraine without aura without status migrainosus, not intractable Assessment & Plan: Insurance does not cover Ubrelvy . Has not been taking it due to insurance issues. - Provide samples of Ubrelvy  for migraine management.   Class 2 severe obesity due to excess calories with serious comorbidity and body mass index (BMI) of 36.0 to 36.9 in adult Assessment & Plan: BMI 36.  She is encouraged to strive for BMI less than 30 to decrease cardiac risk. Advised to aim for at least 150 minutes of exercise per week. - Continue regular physical activity. - Maintain balanced diet with focus on fruits and vegetables.   Immunization due -     Flu vaccine trivalent PF, 6mos and older(Flulaval,Afluria,Fluarix,Fluzone)  General Health Maintenance Overdue for Pap smear. Last recorded Pap smear in 2019. Discussed vaccination status including flu shot and Hepatitis B immunity. History of allergic reaction to Hepatitis B vaccine, but titers show immunity. - Schedule Pap smear. - Ensure documentation of flu shot for work requirements. - No further Hepatitis B vaccination needed due to immunity.  Return for 1 YEAR HM, 6 month bpc. Patient was given opportunity to ask questions. Patient verbalized understanding of the plan and was able to repeat key elements of the plan. All questions were answered to their satisfaction.   I, Carla LOISE Slocumb, MD, have reviewed all documentation for this visit. The documentation on 02/12/24 for the exam, diagnosis, procedures, and orders are all accurate and complete.

## 2024-02-12 NOTE — Patient Instructions (Signed)

## 2024-02-13 LAB — LIPID PANEL
Chol/HDL Ratio: 2.8 ratio (ref 0.0–4.4)
Cholesterol, Total: 158 mg/dL (ref 100–199)
HDL: 56 mg/dL (ref >39)
LDL Chol Calc (NIH): 84 mg/dL (ref 0–99)
Triglycerides: 100 mg/dL (ref 0–149)
VLDL Cholesterol Cal: 18 mg/dL (ref 5–40)

## 2024-02-13 LAB — CMP14+EGFR
ALT: 19 IU/L (ref 0–32)
AST: 17 IU/L (ref 0–40)
Albumin: 4.5 g/dL (ref 3.9–4.9)
Alkaline Phosphatase: 92 IU/L (ref 41–116)
BUN/Creatinine Ratio: 10 (ref 9–23)
BUN: 7 mg/dL (ref 6–20)
Bilirubin Total: 0.4 mg/dL (ref 0.0–1.2)
CO2: 24 mmol/L (ref 20–29)
Calcium: 9.6 mg/dL (ref 8.7–10.2)
Chloride: 100 mmol/L (ref 96–106)
Creatinine, Ser: 0.72 mg/dL (ref 0.57–1.00)
Globulin, Total: 3 g/dL (ref 1.5–4.5)
Glucose: 85 mg/dL (ref 70–99)
Potassium: 3.3 mmol/L — ABNORMAL LOW (ref 3.5–5.2)
Sodium: 141 mmol/L (ref 134–144)
Total Protein: 7.5 g/dL (ref 6.0–8.5)
eGFR: 112 mL/min/1.73 (ref >59)

## 2024-02-13 LAB — CBC
Hematocrit: 33.3 % — ABNORMAL LOW (ref 34.0–46.6)
Hemoglobin: 9.7 g/dL — ABNORMAL LOW (ref 11.1–15.9)
MCH: 22.5 pg — ABNORMAL LOW (ref 26.6–33.0)
MCHC: 29.1 g/dL — ABNORMAL LOW (ref 31.5–35.7)
MCV: 77 fL — ABNORMAL LOW (ref 79–97)
Platelets: 489 x10E3/uL — ABNORMAL HIGH (ref 150–450)
RBC: 4.31 x10E6/uL (ref 3.77–5.28)
RDW: 16.4 % — ABNORMAL HIGH (ref 11.7–15.4)
WBC: 8.2 x10E3/uL (ref 3.4–10.8)

## 2024-02-13 LAB — MICROALBUMIN / CREATININE URINE RATIO
Creatinine, Urine: 108.7 mg/dL
Microalb/Creat Ratio: 10 mg/g{creat} (ref 0–29)
Microalbumin, Urine: 10.7 ug/mL

## 2024-02-13 LAB — IRON,TIBC AND FERRITIN PANEL
Ferritin: 8 ng/mL — ABNORMAL LOW (ref 15–150)
Iron Saturation: 3 % — CL (ref 15–55)
Iron: 16 ug/dL — ABNORMAL LOW (ref 27–159)
Total Iron Binding Capacity: 468 ug/dL — ABNORMAL HIGH (ref 250–450)
UIBC: 452 ug/dL — ABNORMAL HIGH (ref 131–425)

## 2024-02-17 NOTE — Assessment & Plan Note (Signed)
 Insurance does not cover Ubrelvy . Has not been taking it due to insurance issues. - Provide samples of Ubrelvy  for migraine management.

## 2024-02-17 NOTE — Assessment & Plan Note (Signed)

## 2024-02-17 NOTE — Assessment & Plan Note (Addendum)
 Chronic.  EKG performed, NSR w/o acute changes.  Blood pressure well-controlled at 110/82 mmHg. Target diastolic <80 mmHg as per Dr. [place]'s recommendation. Current medications include hydrochlorothiazide  12.5 mg and amlodipine  5 mg. - Continue hydrochlorothiazide  12.5 mg. - Continue amlodipine  5 mg. - Follow low sodium diet.  - Follow up in six months

## 2024-02-17 NOTE — Assessment & Plan Note (Signed)
 Chronic anemia with no reported blood in stools. Lifelong anemia with no evidence of sickle cell trait. Further investigation needed. - Schedule follow-up appointment for further evaluation of anemia. - Check labs as below.

## 2024-02-17 NOTE — Assessment & Plan Note (Addendum)
 BMI 36.  She is encouraged to strive for BMI less than 30 to decrease cardiac risk. Advised to aim for at least 150 minutes of exercise per week. - Continue regular physical activity. - Maintain balanced diet with focus on fruits and vegetables.

## 2024-03-20 ENCOUNTER — Telehealth: Admitting: Physician Assistant

## 2024-03-20 ENCOUNTER — Other Ambulatory Visit (HOSPITAL_COMMUNITY): Payer: Self-pay

## 2024-03-20 DIAGNOSIS — R3989 Other symptoms and signs involving the genitourinary system: Secondary | ICD-10-CM | POA: Diagnosis not present

## 2024-03-20 MED ORDER — FLUCONAZOLE 150 MG PO TABS
150.0000 mg | ORAL_TABLET | ORAL | 0 refills | Status: AC | PRN
  Filled 2024-03-20: qty 2, 6d supply, fill #0

## 2024-03-20 MED ORDER — NITROFURANTOIN MONOHYD MACRO 100 MG PO CAPS
100.0000 mg | ORAL_CAPSULE | Freq: Two times a day (BID) | ORAL | 0 refills | Status: AC
  Filled 2024-03-20: qty 10, 5d supply, fill #0

## 2024-03-20 NOTE — Addendum Note (Signed)
 Addended by: GLADIS ELSIE BROCKS on: 03/20/2024 08:12 AM   Modules accepted: Orders

## 2024-03-20 NOTE — Progress Notes (Signed)

## 2024-06-21 ENCOUNTER — Other Ambulatory Visit: Payer: Self-pay | Admitting: Internal Medicine

## 2024-06-21 DIAGNOSIS — I10 Essential (primary) hypertension: Secondary | ICD-10-CM

## 2024-06-21 DIAGNOSIS — J3089 Other allergic rhinitis: Secondary | ICD-10-CM

## 2024-06-24 ENCOUNTER — Other Ambulatory Visit (HOSPITAL_COMMUNITY): Payer: Self-pay

## 2024-06-24 ENCOUNTER — Encounter (HOSPITAL_COMMUNITY): Payer: Self-pay

## 2024-06-24 MED ORDER — LEVOCETIRIZINE DIHYDROCHLORIDE 5 MG PO TABS
5.0000 mg | ORAL_TABLET | Freq: Every evening | ORAL | 2 refills | Status: AC
  Filled 2024-06-24: qty 90, 90d supply, fill #0

## 2024-06-24 MED ORDER — AMLODIPINE BESYLATE 5 MG PO TABS
5.0000 mg | ORAL_TABLET | Freq: Every day | ORAL | 2 refills | Status: AC
  Filled 2024-06-24: qty 90, 90d supply, fill #0

## 2024-06-24 MED ORDER — HYDROCHLOROTHIAZIDE 12.5 MG PO TABS
12.5000 mg | ORAL_TABLET | Freq: Every day | ORAL | 1 refills | Status: AC
  Filled 2024-06-24: qty 90, 90d supply, fill #0

## 2024-08-19 ENCOUNTER — Ambulatory Visit: Payer: Self-pay | Admitting: Internal Medicine

## 2025-02-17 ENCOUNTER — Encounter: Payer: Self-pay | Admitting: Internal Medicine
